# Patient Record
Sex: Female | Born: 1945 | Race: White | Hispanic: No | Smoking: Former smoker
Health system: Southern US, Community
[De-identification: ages and names within clinical notes are randomized; demographics above are authoritative.]

## PROBLEM LIST (undated history)

## (undated) DIAGNOSIS — K5790 Diverticulosis of intestine, part unspecified, without perforation or abscess without bleeding: Secondary | ICD-10-CM

## (undated) DIAGNOSIS — E118 Type 2 diabetes mellitus with unspecified complications: Secondary | ICD-10-CM

## (undated) DIAGNOSIS — G4733 Obstructive sleep apnea (adult) (pediatric): Secondary | ICD-10-CM

## (undated) DIAGNOSIS — E119 Type 2 diabetes mellitus without complications: Secondary | ICD-10-CM

## (undated) DIAGNOSIS — G4730 Sleep apnea, unspecified: Secondary | ICD-10-CM

## (undated) DIAGNOSIS — I639 Cerebral infarction, unspecified: Secondary | ICD-10-CM

## (undated) DIAGNOSIS — I10 Essential (primary) hypertension: Secondary | ICD-10-CM

## (undated) DIAGNOSIS — M4800 Spinal stenosis, site unspecified: Secondary | ICD-10-CM

## (undated) DIAGNOSIS — M1990 Unspecified osteoarthritis, unspecified site: Secondary | ICD-10-CM

## (undated) DIAGNOSIS — E1165 Type 2 diabetes mellitus with hyperglycemia: Secondary | ICD-10-CM

## (undated) DIAGNOSIS — Z8719 Personal history of other diseases of the digestive system: Secondary | ICD-10-CM

## (undated) DIAGNOSIS — K219 Gastro-esophageal reflux disease without esophagitis: Secondary | ICD-10-CM

## (undated) DIAGNOSIS — F329 Major depressive disorder, single episode, unspecified: Secondary | ICD-10-CM

## (undated) DIAGNOSIS — E039 Hypothyroidism, unspecified: Secondary | ICD-10-CM

## (undated) DIAGNOSIS — A419 Sepsis, unspecified organism: Secondary | ICD-10-CM

## (undated) DIAGNOSIS — E785 Hyperlipidemia, unspecified: Secondary | ICD-10-CM

## (undated) HISTORY — DX: Spinal stenosis, site unspecified: M48.00

## (undated) HISTORY — DX: Type 2 diabetes mellitus without complications: E11.9

## (undated) HISTORY — DX: Hyperlipidemia, unspecified: E78.5

## (undated) HISTORY — PX: BACK SURGERY: SHX140

## (undated) HISTORY — PX: NECK SURGERY: SHX720

## (undated) HISTORY — PX: CARPAL TUNNEL RELEASE: SHX101

## (undated) HISTORY — DX: Diverticulosis of intestine, part unspecified, without perforation or abscess without bleeding: K57.90

## (undated) HISTORY — DX: Essential (primary) hypertension: I10

## (undated) HISTORY — PX: TOTAL ABDOMINAL HYSTERECTOMY: SHX209

## (undated) HISTORY — PX: ILIOTIBIAL BAND RELEASE: SHX675

## (undated) HISTORY — DX: Major depressive disorder, single episode, unspecified: F32.9

## (undated) HISTORY — DX: Sepsis, unspecified organism: A41.9

## (undated) HISTORY — DX: Hypothyroidism, unspecified: E03.9

## (undated) HISTORY — DX: Unspecified osteoarthritis, unspecified site: M19.90

## (undated) HISTORY — DX: Gastro-esophageal reflux disease without esophagitis: K21.9

---

## 2003-08-11 ENCOUNTER — Ambulatory Visit (HOSPITAL_COMMUNITY): Admission: RE | Admit: 2003-08-11 | Discharge: 2003-08-11 | Payer: Self-pay | Admitting: Neurology

## 2003-08-11 ENCOUNTER — Encounter: Payer: Self-pay | Admitting: Neurology

## 2003-12-17 ENCOUNTER — Ambulatory Visit (HOSPITAL_COMMUNITY)
Admission: RE | Admit: 2003-12-17 | Discharge: 2003-12-17 | Payer: Self-pay | Admitting: Physical Medicine and Rehabilitation

## 2005-01-10 ENCOUNTER — Encounter: Payer: Self-pay | Admitting: Orthopedic Surgery

## 2005-01-19 ENCOUNTER — Encounter: Payer: Self-pay | Admitting: Orthopedic Surgery

## 2005-02-19 ENCOUNTER — Encounter: Payer: Self-pay | Admitting: Orthopedic Surgery

## 2006-11-16 ENCOUNTER — Ambulatory Visit (HOSPITAL_COMMUNITY): Admission: RE | Admit: 2006-11-16 | Discharge: 2006-11-16 | Payer: Self-pay | Admitting: Family Medicine

## 2009-07-20 ENCOUNTER — Ambulatory Visit (HOSPITAL_COMMUNITY): Admission: RE | Admit: 2009-07-20 | Discharge: 2009-07-20 | Payer: Self-pay | Admitting: Internal Medicine

## 2009-08-05 ENCOUNTER — Ambulatory Visit: Payer: Self-pay | Admitting: Cardiology

## 2009-08-05 DIAGNOSIS — I10 Essential (primary) hypertension: Secondary | ICD-10-CM | POA: Insufficient documentation

## 2009-08-05 DIAGNOSIS — E782 Mixed hyperlipidemia: Secondary | ICD-10-CM

## 2009-08-05 DIAGNOSIS — R072 Precordial pain: Secondary | ICD-10-CM

## 2009-08-07 ENCOUNTER — Ambulatory Visit: Payer: Self-pay | Admitting: Cardiology

## 2009-08-07 ENCOUNTER — Encounter: Payer: Self-pay | Admitting: Cardiology

## 2009-08-07 ENCOUNTER — Encounter (HOSPITAL_COMMUNITY): Admission: RE | Admit: 2009-08-07 | Discharge: 2009-08-20 | Payer: Self-pay | Admitting: Cardiology

## 2009-08-10 ENCOUNTER — Encounter (INDEPENDENT_AMBULATORY_CARE_PROVIDER_SITE_OTHER): Payer: Self-pay | Admitting: *Deleted

## 2009-08-20 ENCOUNTER — Encounter: Payer: Self-pay | Admitting: Cardiology

## 2009-09-01 ENCOUNTER — Ambulatory Visit: Payer: Self-pay | Admitting: Cardiology

## 2009-09-01 DIAGNOSIS — R0602 Shortness of breath: Secondary | ICD-10-CM

## 2010-05-27 ENCOUNTER — Inpatient Hospital Stay (HOSPITAL_COMMUNITY): Admission: EM | Admit: 2010-05-27 | Discharge: 2010-06-04 | Payer: Self-pay | Admitting: Emergency Medicine

## 2010-12-11 ENCOUNTER — Encounter: Payer: Self-pay | Admitting: Family Medicine

## 2010-12-12 ENCOUNTER — Encounter: Payer: Self-pay | Admitting: Family Medicine

## 2010-12-13 ENCOUNTER — Encounter: Payer: Self-pay | Admitting: Internal Medicine

## 2011-02-05 LAB — GLUCOSE, CAPILLARY
Glucose-Capillary: 138 mg/dL — ABNORMAL HIGH (ref 70–99)
Glucose-Capillary: 139 mg/dL — ABNORMAL HIGH (ref 70–99)

## 2011-02-05 LAB — CBC
HCT: 32.7 % — ABNORMAL LOW (ref 36.0–46.0)
Hemoglobin: 11.1 g/dL — ABNORMAL LOW (ref 12.0–15.0)
MCH: 30 pg (ref 26.0–34.0)
MCHC: 33.9 g/dL (ref 30.0–36.0)
MCV: 88.4 fL (ref 78.0–100.0)
Platelets: 186 10*3/uL (ref 150–400)
RBC: 3.7 MIL/uL — ABNORMAL LOW (ref 3.87–5.11)
RDW: 14.6 % (ref 11.5–15.5)
WBC: 13.1 10*3/uL — ABNORMAL HIGH (ref 4.0–10.5)

## 2011-02-05 LAB — BASIC METABOLIC PANEL
BUN: 18 mg/dL (ref 6–23)
CO2: 26 mEq/L (ref 19–32)
Calcium: 8.7 mg/dL (ref 8.4–10.5)
Chloride: 105 mEq/L (ref 96–112)
Creatinine, Ser: 1.06 mg/dL (ref 0.4–1.2)
GFR calc Af Amer: >60 mL/min (ref >60)
GFR calc non Af Amer: 53 mL/min — ABNORMAL LOW (ref >60)
Glucose, Bld: 134 mg/dL — ABNORMAL HIGH (ref 70–99)
Potassium: 3.4 mEq/L — ABNORMAL LOW (ref 3.5–5.1)
Sodium: 139 mEq/L (ref 135–145)

## 2011-02-05 LAB — DIFFERENTIAL
Basophils Absolute: 0 10*3/uL (ref 0.0–0.1)
Basophils Relative: 0 % (ref 0–1)
Eosinophils Absolute: 0.2 10*3/uL (ref 0.0–0.7)
Eosinophils Relative: 1 % (ref 0–5)
Lymphocytes Relative: 18 % (ref 12–46)
Lymphs Abs: 2.3 10*3/uL (ref 0.7–4.0)
Monocytes Absolute: 0.3 10*3/uL (ref 0.1–1.0)
Monocytes Relative: 3 % (ref 3–12)
Neutro Abs: 10.2 10*3/uL — ABNORMAL HIGH (ref 1.7–7.7)
Neutrophils Relative %: 78 % — ABNORMAL HIGH (ref 43–77)

## 2011-02-06 LAB — BASIC METABOLIC PANEL
BUN: 21 mg/dL (ref 6–23)
BUN: 22 mg/dL (ref 6–23)
BUN: 24 mg/dL — ABNORMAL HIGH (ref 6–23)
BUN: 26 mg/dL — ABNORMAL HIGH (ref 6–23)
BUN: 34 mg/dL — ABNORMAL HIGH (ref 6–23)
CO2: 19 mEq/L (ref 19–32)
CO2: 20 mEq/L (ref 19–32)
CO2: 21 mEq/L (ref 19–32)
CO2: 22 mEq/L (ref 19–32)
CO2: 24 mEq/L (ref 19–32)
Calcium: 7.9 mg/dL — ABNORMAL LOW (ref 8.4–10.5)
Calcium: 8.2 mg/dL — ABNORMAL LOW (ref 8.4–10.5)
Calcium: 8.4 mg/dL (ref 8.4–10.5)
Chloride: 103 mEq/L (ref 96–112)
Chloride: 107 mEq/L (ref 96–112)
Chloride: 112 mEq/L (ref 96–112)
Chloride: 92 mEq/L — ABNORMAL LOW (ref 96–112)
Creatinine, Ser: 2.12 mg/dL — ABNORMAL HIGH (ref 0.4–1.2)
Creatinine, Ser: 2.93 mg/dL — ABNORMAL HIGH (ref 0.4–1.2)
GFR calc Af Amer: 20 mL/min — ABNORMAL LOW (ref >60)
GFR calc Af Amer: 29 mL/min — ABNORMAL LOW (ref >60)
GFR calc non Af Amer: 16 mL/min — ABNORMAL LOW (ref >60)
GFR calc non Af Amer: 24 mL/min — ABNORMAL LOW (ref >60)
GFR calc non Af Amer: 30 mL/min — ABNORMAL LOW (ref >60)
GFR calc non Af Amer: 35 mL/min — ABNORMAL LOW (ref >60)
Glucose, Bld: 114 mg/dL — ABNORMAL HIGH (ref 70–99)
Glucose, Bld: 139 mg/dL — ABNORMAL HIGH (ref 70–99)
Glucose, Bld: 143 mg/dL — ABNORMAL HIGH (ref 70–99)
Glucose, Bld: 160 mg/dL — ABNORMAL HIGH (ref 70–99)
Glucose, Bld: 164 mg/dL — ABNORMAL HIGH (ref 70–99)
Potassium: 2.9 mEq/L — ABNORMAL LOW (ref 3.5–5.1)
Potassium: 3.5 mEq/L (ref 3.5–5.1)
Potassium: 3.5 mEq/L (ref 3.5–5.1)
Potassium: 3.7 mEq/L (ref 3.5–5.1)
Potassium: 4 mEq/L (ref 3.5–5.1)
Sodium: 129 mEq/L — ABNORMAL LOW (ref 135–145)
Sodium: 131 mEq/L — ABNORMAL LOW (ref 135–145)
Sodium: 138 mEq/L (ref 135–145)
Sodium: 140 mEq/L (ref 135–145)

## 2011-02-06 LAB — DIFFERENTIAL
Basophils Absolute: 0 10*3/uL (ref 0.0–0.1)
Basophils Absolute: 0 10*3/uL (ref 0.0–0.1)
Basophils Absolute: 0 10*3/uL (ref 0.0–0.1)
Basophils Absolute: 0 10*3/uL (ref 0.0–0.1)
Basophils Absolute: 0 10*3/uL (ref 0.0–0.1)
Basophils Absolute: 0 10*3/uL (ref 0.0–0.1)
Basophils Relative: 0 % (ref 0–1)
Basophils Relative: 0 % (ref 0–1)
Basophils Relative: 0 % (ref 0–1)
Basophils Relative: 0 % (ref 0–1)
Basophils Relative: 0 % (ref 0–1)
Basophils Relative: 0 % (ref 0–1)
Basophils Relative: 0 % (ref 0–1)
Eosinophils Absolute: 0 10*3/uL (ref 0.0–0.7)
Eosinophils Absolute: 0.1 10*3/uL (ref 0.0–0.7)
Eosinophils Absolute: 0.2 10*3/uL (ref 0.0–0.7)
Eosinophils Absolute: 0.2 10*3/uL (ref 0.0–0.7)
Eosinophils Absolute: 0.3 10*3/uL (ref 0.0–0.7)
Eosinophils Absolute: 0.3 10*3/uL (ref 0.0–0.7)
Eosinophils Relative: 0 % (ref 0–5)
Eosinophils Relative: 1 % (ref 0–5)
Eosinophils Relative: 2 % (ref 0–5)
Eosinophils Relative: 2 % (ref 0–5)
Eosinophils Relative: 2 % (ref 0–5)
Eosinophils Relative: 2 % (ref 0–5)
Lymphocytes Relative: 16 % (ref 12–46)
Lymphocytes Relative: 6 % — ABNORMAL LOW (ref 12–46)
Lymphocytes Relative: 6 % — ABNORMAL LOW (ref 12–46)
Lymphocytes Relative: 7 % — ABNORMAL LOW (ref 12–46)
Lymphocytes Relative: 8 % — ABNORMAL LOW (ref 12–46)
Lymphocytes Relative: 8 % — ABNORMAL LOW (ref 12–46)
Lymphs Abs: 0.5 10*3/uL — ABNORMAL LOW (ref 0.7–4.0)
Lymphs Abs: 0.7 10*3/uL (ref 0.7–4.0)
Lymphs Abs: 0.7 10*3/uL (ref 0.7–4.0)
Lymphs Abs: 0.7 10*3/uL (ref 0.7–4.0)
Lymphs Abs: 0.8 10*3/uL (ref 0.7–4.0)
Lymphs Abs: 2.2 10*3/uL (ref 0.7–4.0)
Monocytes Absolute: 0.1 10*3/uL (ref 0.1–1.0)
Monocytes Absolute: 0.3 10*3/uL (ref 0.1–1.0)
Monocytes Absolute: 0.3 10*3/uL (ref 0.1–1.0)
Monocytes Absolute: 0.4 10*3/uL (ref 0.1–1.0)
Monocytes Absolute: 0.4 10*3/uL (ref 0.1–1.0)
Monocytes Absolute: 0.6 10*3/uL (ref 0.1–1.0)
Monocytes Absolute: 0.6 10*3/uL (ref 0.1–1.0)
Monocytes Relative: 3 % (ref 3–12)
Monocytes Relative: 3 % (ref 3–12)
Monocytes Relative: 3 % (ref 3–12)
Monocytes Relative: 4 % (ref 3–12)
Monocytes Relative: 5 % (ref 3–12)
Monocytes Relative: 6 % (ref 3–12)
Neutro Abs: 10 10*3/uL — ABNORMAL HIGH (ref 1.7–7.7)
Neutro Abs: 10 10*3/uL — ABNORMAL HIGH (ref 1.7–7.7)
Neutro Abs: 2.9 10*3/uL (ref 1.7–7.7)
Neutro Abs: 7.3 10*3/uL (ref 1.7–7.7)
Neutro Abs: 8.2 10*3/uL — ABNORMAL HIGH (ref 1.7–7.7)
Neutro Abs: 8.3 10*3/uL — ABNORMAL HIGH (ref 1.7–7.7)
Neutro Abs: 9.8 10*3/uL — ABNORMAL HIGH (ref 1.7–7.7)
Neutrophils Relative %: 77 % (ref 43–77)
Neutrophils Relative %: 85 % — ABNORMAL HIGH (ref 43–77)
Neutrophils Relative %: 86 % — ABNORMAL HIGH (ref 43–77)
Neutrophils Relative %: 90 % — ABNORMAL HIGH (ref 43–77)

## 2011-02-06 LAB — CBC
HCT: 30.3 % — ABNORMAL LOW (ref 36.0–46.0)
HCT: 30.5 % — ABNORMAL LOW (ref 36.0–46.0)
HCT: 30.5 % — ABNORMAL LOW (ref 36.0–46.0)
HCT: 30.7 % — ABNORMAL LOW (ref 36.0–46.0)
HCT: 31.9 % — ABNORMAL LOW (ref 36.0–46.0)
HCT: 33.9 % — ABNORMAL LOW (ref 36.0–46.0)
HCT: 40.5 % (ref 36.0–46.0)
Hemoglobin: 10.4 g/dL — ABNORMAL LOW (ref 12.0–15.0)
Hemoglobin: 10.5 g/dL — ABNORMAL LOW (ref 12.0–15.0)
Hemoglobin: 10.6 g/dL — ABNORMAL LOW (ref 12.0–15.0)
Hemoglobin: 11.1 g/dL — ABNORMAL LOW (ref 12.0–15.0)
Hemoglobin: 11.4 g/dL — ABNORMAL LOW (ref 12.0–15.0)
Hemoglobin: 11.6 g/dL — ABNORMAL LOW (ref 12.0–15.0)
Hemoglobin: 13.8 g/dL (ref 12.0–15.0)
MCH: 30 pg (ref 26.0–34.0)
MCH: 30 pg (ref 26.0–34.0)
MCH: 30.2 pg (ref 26.0–34.0)
MCH: 30.2 pg (ref 26.0–34.0)
MCH: 30.3 pg (ref 26.0–34.0)
MCH: 30.3 pg (ref 26.0–34.0)
MCH: 30.5 pg (ref 26.0–34.0)
MCH: 30.5 pg (ref 26.0–34.0)
MCHC: 34.1 g/dL (ref 30.0–36.0)
MCHC: 34.1 g/dL (ref 30.0–36.0)
MCHC: 34.1 g/dL (ref 30.0–36.0)
MCHC: 34.3 g/dL (ref 30.0–36.0)
MCHC: 34.3 g/dL (ref 30.0–36.0)
MCHC: 34.6 g/dL (ref 30.0–36.0)
MCHC: 34.6 g/dL (ref 30.0–36.0)
MCHC: 34.8 g/dL (ref 30.0–36.0)
MCV: 87.6 fL (ref 78.0–100.0)
MCV: 87.7 fL (ref 78.0–100.0)
MCV: 88.1 fL (ref 78.0–100.0)
MCV: 88.1 fL (ref 78.0–100.0)
MCV: 88.6 fL (ref 78.0–100.0)
MCV: 88.8 fL (ref 78.0–100.0)
Platelets: 104 10*3/uL — ABNORMAL LOW (ref 150–400)
Platelets: 137 10*3/uL — ABNORMAL LOW (ref 150–400)
Platelets: 70 10*3/uL — ABNORMAL LOW (ref 150–400)
Platelets: 85 10*3/uL — ABNORMAL LOW (ref 150–400)
Platelets: 95 10*3/uL — ABNORMAL LOW (ref 150–400)
RBC: 3.46 MIL/uL — ABNORMAL LOW (ref 3.87–5.11)
RBC: 3.85 MIL/uL — ABNORMAL LOW (ref 3.87–5.11)
RBC: 4.56 MIL/uL (ref 3.87–5.11)
RBC: 4.63 MIL/uL (ref 3.87–5.11)
RDW: 13.9 % (ref 11.5–15.5)
RDW: 15 % (ref 11.5–15.5)
RDW: 15.5 % (ref 11.5–15.5)
RDW: 15.6 % — ABNORMAL HIGH (ref 11.5–15.5)
RDW: 15.7 % — ABNORMAL HIGH (ref 11.5–15.5)
RDW: 15.8 % — ABNORMAL HIGH (ref 11.5–15.5)
WBC: 11.1 10*3/uL — ABNORMAL HIGH (ref 4.0–10.5)
WBC: 8.5 10*3/uL (ref 4.0–10.5)
WBC: 9.7 10*3/uL (ref 4.0–10.5)

## 2011-02-06 LAB — GLUCOSE, CAPILLARY
Glucose-Capillary: 107 mg/dL — ABNORMAL HIGH (ref 70–99)
Glucose-Capillary: 114 mg/dL — ABNORMAL HIGH (ref 70–99)
Glucose-Capillary: 117 mg/dL — ABNORMAL HIGH (ref 70–99)
Glucose-Capillary: 120 mg/dL — ABNORMAL HIGH (ref 70–99)
Glucose-Capillary: 121 mg/dL — ABNORMAL HIGH (ref 70–99)
Glucose-Capillary: 123 mg/dL — ABNORMAL HIGH (ref 70–99)
Glucose-Capillary: 125 mg/dL — ABNORMAL HIGH (ref 70–99)
Glucose-Capillary: 128 mg/dL — ABNORMAL HIGH (ref 70–99)
Glucose-Capillary: 134 mg/dL — ABNORMAL HIGH (ref 70–99)
Glucose-Capillary: 136 mg/dL — ABNORMAL HIGH (ref 70–99)
Glucose-Capillary: 138 mg/dL — ABNORMAL HIGH (ref 70–99)
Glucose-Capillary: 142 mg/dL — ABNORMAL HIGH (ref 70–99)
Glucose-Capillary: 148 mg/dL — ABNORMAL HIGH (ref 70–99)
Glucose-Capillary: 150 mg/dL — ABNORMAL HIGH (ref 70–99)
Glucose-Capillary: 164 mg/dL — ABNORMAL HIGH (ref 70–99)
Glucose-Capillary: 168 mg/dL — ABNORMAL HIGH (ref 70–99)
Glucose-Capillary: 198 mg/dL — ABNORMAL HIGH (ref 70–99)

## 2011-02-06 LAB — COMPREHENSIVE METABOLIC PANEL
ALT: 18 U/L (ref 0–35)
ALT: 27 U/L (ref 0–35)
AST: 27 U/L (ref 0–37)
AST: 30 U/L (ref 0–37)
AST: 42 U/L — ABNORMAL HIGH (ref 0–37)
Albumin: 1.9 g/dL — ABNORMAL LOW (ref 3.5–5.2)
Albumin: 2.2 g/dL — ABNORMAL LOW (ref 3.5–5.2)
Alkaline Phosphatase: 90 U/L (ref 39–117)
Alkaline Phosphatase: 96 U/L (ref 39–117)
BUN: 29 mg/dL — ABNORMAL HIGH (ref 6–23)
BUN: 31 mg/dL — ABNORMAL HIGH (ref 6–23)
CO2: 17 mEq/L — ABNORMAL LOW (ref 19–32)
CO2: 20 mEq/L (ref 19–32)
CO2: 20 mEq/L (ref 19–32)
Calcium: 7 mg/dL — ABNORMAL LOW (ref 8.4–10.5)
Calcium: 7.5 mg/dL — ABNORMAL LOW (ref 8.4–10.5)
Calcium: 7.6 mg/dL — ABNORMAL LOW (ref 8.4–10.5)
Chloride: 107 mEq/L (ref 96–112)
Chloride: 97 mEq/L (ref 96–112)
Creatinine, Ser: 2.29 mg/dL — ABNORMAL HIGH (ref 0.4–1.2)
Creatinine, Ser: 2.86 mg/dL — ABNORMAL HIGH (ref 0.4–1.2)
Creatinine, Ser: 2.94 mg/dL — ABNORMAL HIGH (ref 0.4–1.2)
GFR calc Af Amer: 20 mL/min — ABNORMAL LOW (ref >60)
GFR calc Af Amer: 26 mL/min — ABNORMAL LOW (ref >60)
GFR calc Af Amer: 51 mL/min — ABNORMAL LOW (ref >60)
GFR calc non Af Amer: 16 mL/min — ABNORMAL LOW (ref >60)
GFR calc non Af Amer: 17 mL/min — ABNORMAL LOW (ref >60)
GFR calc non Af Amer: 22 mL/min — ABNORMAL LOW (ref >60)
Glucose, Bld: 117 mg/dL — ABNORMAL HIGH (ref 70–99)
Glucose, Bld: 144 mg/dL — ABNORMAL HIGH (ref 70–99)
Potassium: 3.2 mEq/L — ABNORMAL LOW (ref 3.5–5.1)
Potassium: 4 mEq/L (ref 3.5–5.1)
Sodium: 135 mEq/L (ref 135–145)
Sodium: 141 mEq/L (ref 135–145)
Total Bilirubin: 0.8 mg/dL (ref 0.3–1.2)
Total Bilirubin: 1.4 mg/dL — ABNORMAL HIGH (ref 0.3–1.2)
Total Protein: 5.3 g/dL — ABNORMAL LOW (ref 6.0–8.3)
Total Protein: 5.5 g/dL — ABNORMAL LOW (ref 6.0–8.3)

## 2011-02-06 LAB — CLOSTRIDIUM DIFFICILE EIA: C difficile Toxins A+B, EIA: NEGATIVE

## 2011-02-06 LAB — HEPATIC FUNCTION PANEL
ALT: 23 U/L (ref 0–35)
AST: 38 U/L — ABNORMAL HIGH (ref 0–37)
Albumin: 2.8 g/dL — ABNORMAL LOW (ref 3.5–5.2)
Alkaline Phosphatase: 55 U/L (ref 39–117)
Bilirubin, Direct: 0.4 mg/dL — ABNORMAL HIGH (ref 0.0–0.3)
Indirect Bilirubin: 0.6 mg/dL (ref 0.3–0.9)
Total Bilirubin: 1 mg/dL (ref 0.3–1.2)
Total Protein: 5.9 g/dL — ABNORMAL LOW (ref 6.0–8.3)

## 2011-02-06 LAB — BLOOD GAS, ARTERIAL
Acid-base deficit: 5.4 mmol/L — ABNORMAL HIGH (ref 0.0–2.0)
Acid-base deficit: 7 mmol/L — ABNORMAL HIGH (ref 0.0–2.0)
Acid-base deficit: 7.3 mmol/L — ABNORMAL HIGH (ref 0.0–2.0)
Bicarbonate: 14.3 mEq/L — ABNORMAL LOW (ref 20.0–24.0)
Bicarbonate: 17.1 mEq/L — ABNORMAL LOW (ref 20.0–24.0)
Bicarbonate: 17.6 mEq/L — ABNORMAL LOW (ref 20.0–24.0)
Bicarbonate: 19.8 mEq/L — ABNORMAL LOW (ref 20.0–24.0)
O2 Content: 3 L/min
O2 Content: 3 L/min
O2 Content: 3 L/min
O2 Content: 5 L/min
O2 Saturation: 94.6 %
O2 Saturation: 96.1 %
O2 Saturation: 97 %
O2 Saturation: 97.9 %
Patient temperature: 37
Patient temperature: 37
Patient temperature: 37
Patient temperature: 98.6
TCO2: 18.1 mmol/L (ref 0–100)
pCO2 arterial: 29.7 mmHg — ABNORMAL LOW (ref 35.0–45.0)
pCO2 arterial: 35.1 mmHg (ref 35.0–45.0)
pCO2 arterial: 40.6 mmHg (ref 35.0–45.0)
pH, Arterial: 7.308 — ABNORMAL LOW (ref 7.350–7.400)
pH, Arterial: 7.321 — ABNORMAL LOW (ref 7.350–7.400)
pH, Arterial: 7.379 (ref 7.350–7.400)
pO2, Arterial: 106 mmHg — ABNORMAL HIGH (ref 80.0–100.0)
pO2, Arterial: 81.5 mmHg (ref 80.0–100.0)
pO2, Arterial: 85.2 mmHg (ref 80.0–100.0)
pO2, Arterial: 89.4 mmHg (ref 80.0–100.0)

## 2011-02-06 LAB — DIC (DISSEMINATED INTRAVASCULAR COAGULATION)PANEL
D-Dimer, Quant: 3 ug/mL-FEU — ABNORMAL HIGH (ref 0.00–0.48)
Fibrinogen: >800 mg/dL — ABNORMAL HIGH (ref 204–475)
Platelets: 84 10*3/uL — ABNORMAL LOW (ref 150–400)

## 2011-02-06 LAB — CULTURE, BLOOD (ROUTINE X 2)
Culture: NO GROWTH
Report Status: 7122011

## 2011-02-06 LAB — URINALYSIS, ROUTINE W REFLEX MICROSCOPIC
Nitrite: NEGATIVE
Protein, ur: 100 mg/dL — AB
Specific Gravity, Urine: 1.025 (ref 1.005–1.030)
Urobilinogen, UA: 0.2 mg/dL (ref 0.0–1.0)

## 2011-02-06 LAB — MRSA PCR SCREENING: MRSA by PCR: NEGATIVE

## 2011-02-06 LAB — ACTH STIMULATION, 3 TIME POINTS: Cortisol, Base: 19.3 ug/dL

## 2011-02-06 LAB — APTT: aPTT: 32 seconds (ref 24–37)

## 2011-02-06 LAB — URINE CULTURE

## 2011-02-06 LAB — T4, FREE: Free T4: 1.45 ng/dL (ref 0.80–1.80)

## 2011-02-06 LAB — BRAIN NATRIURETIC PEPTIDE: Pro B Natriuretic peptide (BNP): 340 pg/mL — ABNORMAL HIGH (ref 0.0–100.0)

## 2011-02-06 LAB — LACTIC ACID, PLASMA
Lactic Acid, Venous: 4.2 mmol/L — ABNORMAL HIGH (ref 0.5–2.2)
Lactic Acid, Venous: 4.7 mmol/L — ABNORMAL HIGH (ref 0.5–2.2)

## 2011-02-06 LAB — PROCALCITONIN: Procalcitonin: 113.34 ng/mL

## 2011-02-06 LAB — LIPASE, BLOOD: Lipase: 18 U/L (ref 11–59)

## 2011-02-06 LAB — URINE MICROSCOPIC-ADD ON

## 2011-02-06 LAB — MAGNESIUM: Magnesium: 1.6 mg/dL (ref 1.5–2.5)

## 2011-07-26 ENCOUNTER — Other Ambulatory Visit (HOSPITAL_COMMUNITY): Payer: Self-pay | Admitting: Family Medicine

## 2011-07-26 DIAGNOSIS — Z139 Encounter for screening, unspecified: Secondary | ICD-10-CM

## 2011-07-28 ENCOUNTER — Ambulatory Visit (HOSPITAL_COMMUNITY): Payer: Medicaid Other

## 2011-08-05 ENCOUNTER — Ambulatory Visit (HOSPITAL_COMMUNITY): Payer: Medicaid Other

## 2011-08-12 ENCOUNTER — Ambulatory Visit (HOSPITAL_COMMUNITY)
Admission: RE | Admit: 2011-08-12 | Discharge: 2011-08-12 | Disposition: A | Discharge status: Home / Self Care | Payer: Medicaid Other | Source: Ambulatory Visit | Attending: Family Medicine | Admitting: Family Medicine

## 2011-08-12 DIAGNOSIS — Z139 Encounter for screening, unspecified: Secondary | ICD-10-CM

## 2011-08-12 DIAGNOSIS — Z1231 Encounter for screening mammogram for malignant neoplasm of breast: Secondary | ICD-10-CM | POA: Insufficient documentation

## 2011-08-17 ENCOUNTER — Other Ambulatory Visit: Payer: Self-pay | Admitting: Family Medicine

## 2011-08-17 DIAGNOSIS — R928 Other abnormal and inconclusive findings on diagnostic imaging of breast: Secondary | ICD-10-CM

## 2011-09-07 ENCOUNTER — Ambulatory Visit (HOSPITAL_COMMUNITY)
Admission: RE | Admit: 2011-09-07 | Discharge: 2011-09-07 | Disposition: A | Discharge status: Home / Self Care | Payer: Medicaid Other | Source: Ambulatory Visit | Attending: Family Medicine | Admitting: Family Medicine

## 2011-09-07 ENCOUNTER — Other Ambulatory Visit: Payer: Self-pay | Admitting: Family Medicine

## 2011-09-07 DIAGNOSIS — R928 Other abnormal and inconclusive findings on diagnostic imaging of breast: Secondary | ICD-10-CM

## 2011-11-16 ENCOUNTER — Encounter: Payer: Self-pay | Admitting: Cardiology

## 2012-02-24 ENCOUNTER — Other Ambulatory Visit (HOSPITAL_COMMUNITY): Payer: Self-pay | Admitting: Physician Assistant

## 2012-02-24 DIAGNOSIS — Z09 Encounter for follow-up examination after completed treatment for conditions other than malignant neoplasm: Secondary | ICD-10-CM

## 2012-03-14 ENCOUNTER — Ambulatory Visit (HOSPITAL_COMMUNITY)
Admission: RE | Admit: 2012-03-14 | Discharge: 2012-03-14 | Disposition: A | Discharge status: Home / Self Care | Payer: Medicaid Other | Source: Ambulatory Visit | Attending: Physician Assistant | Admitting: Physician Assistant

## 2012-03-14 ENCOUNTER — Other Ambulatory Visit (HOSPITAL_COMMUNITY): Payer: Self-pay | Admitting: Physician Assistant

## 2012-03-14 DIAGNOSIS — Z09 Encounter for follow-up examination after completed treatment for conditions other than malignant neoplasm: Secondary | ICD-10-CM

## 2012-03-14 DIAGNOSIS — N6489 Other specified disorders of breast: Secondary | ICD-10-CM | POA: Insufficient documentation

## 2012-09-04 ENCOUNTER — Other Ambulatory Visit (HOSPITAL_COMMUNITY): Payer: Self-pay | Admitting: Physician Assistant

## 2012-09-04 DIAGNOSIS — Z09 Encounter for follow-up examination after completed treatment for conditions other than malignant neoplasm: Secondary | ICD-10-CM

## 2012-09-12 ENCOUNTER — Ambulatory Visit (HOSPITAL_COMMUNITY)
Admission: RE | Admit: 2012-09-12 | Discharge: 2012-09-12 | Disposition: A | Discharge status: Home / Self Care | Payer: Medicaid Other | Source: Ambulatory Visit | Attending: Physician Assistant | Admitting: Physician Assistant

## 2012-09-12 DIAGNOSIS — R928 Other abnormal and inconclusive findings on diagnostic imaging of breast: Secondary | ICD-10-CM | POA: Insufficient documentation

## 2012-09-12 DIAGNOSIS — Z09 Encounter for follow-up examination after completed treatment for conditions other than malignant neoplasm: Secondary | ICD-10-CM | POA: Insufficient documentation

## 2013-04-18 DIAGNOSIS — Z6832 Body mass index (BMI) 32.0-32.9, adult: Secondary | ICD-10-CM | POA: Diagnosis not present

## 2013-04-18 DIAGNOSIS — IMO0001 Reserved for inherently not codable concepts without codable children: Secondary | ICD-10-CM | POA: Diagnosis not present

## 2013-04-18 DIAGNOSIS — T148 Other injury of unspecified body region: Secondary | ICD-10-CM | POA: Diagnosis not present

## 2013-04-18 DIAGNOSIS — W57XXXA Bitten or stung by nonvenomous insect and other nonvenomous arthropods, initial encounter: Secondary | ICD-10-CM | POA: Diagnosis not present

## 2013-04-26 DIAGNOSIS — M479 Spondylosis, unspecified: Secondary | ICD-10-CM | POA: Diagnosis not present

## 2013-04-26 DIAGNOSIS — M5137 Other intervertebral disc degeneration, lumbosacral region: Secondary | ICD-10-CM | POA: Diagnosis not present

## 2013-04-26 DIAGNOSIS — M4120 Other idiopathic scoliosis, site unspecified: Secondary | ICD-10-CM | POA: Diagnosis not present

## 2013-04-26 DIAGNOSIS — M545 Low back pain: Secondary | ICD-10-CM | POA: Diagnosis not present

## 2013-05-02 DIAGNOSIS — E119 Type 2 diabetes mellitus without complications: Secondary | ICD-10-CM | POA: Diagnosis not present

## 2013-05-02 DIAGNOSIS — R109 Unspecified abdominal pain: Secondary | ICD-10-CM | POA: Diagnosis not present

## 2013-05-02 DIAGNOSIS — Z6832 Body mass index (BMI) 32.0-32.9, adult: Secondary | ICD-10-CM | POA: Diagnosis not present

## 2013-05-02 DIAGNOSIS — Z23 Encounter for immunization: Secondary | ICD-10-CM | POA: Diagnosis not present

## 2013-05-06 ENCOUNTER — Ambulatory Visit (INDEPENDENT_AMBULATORY_CARE_PROVIDER_SITE_OTHER): Payer: Medicare Other | Admitting: Gastroenterology

## 2013-05-06 ENCOUNTER — Encounter: Payer: Self-pay | Admitting: Gastroenterology

## 2013-05-06 VITALS — BP 144/86 | HR 70 | Temp 98.1°F | Ht 65.0 in | Wt 189.6 lb

## 2013-05-06 DIAGNOSIS — R1310 Dysphagia, unspecified: Secondary | ICD-10-CM

## 2013-05-06 DIAGNOSIS — R1013 Epigastric pain: Secondary | ICD-10-CM | POA: Insufficient documentation

## 2013-05-06 DIAGNOSIS — R1011 Right upper quadrant pain: Secondary | ICD-10-CM | POA: Diagnosis not present

## 2013-05-06 DIAGNOSIS — K219 Gastro-esophageal reflux disease without esophagitis: Secondary | ICD-10-CM

## 2013-05-06 DIAGNOSIS — R1314 Dysphagia, pharyngoesophageal phase: Secondary | ICD-10-CM | POA: Diagnosis not present

## 2013-05-06 DIAGNOSIS — Z8371 Family history of colonic polyps: Secondary | ICD-10-CM

## 2013-05-06 DIAGNOSIS — Z83719 Family history of colon polyps, unspecified: Secondary | ICD-10-CM | POA: Insufficient documentation

## 2013-05-06 MED ORDER — PEG 3350-KCL-NA BICARB-NACL 420 G PO SOLR: 4000.0000 mL | ORAL | Status: DC

## 2013-05-06 NOTE — Assessment & Plan Note (Signed)
67 year old lady with chronic GERD, epigastric pain, chronic back pain he takes occasional Excedrin Migraine. She has developed frequent epigastric/right upper quadrant pain. Seems to be unrelated to meals or activities. She does have some solid food esophageal dysphagia. Recommend EGD with dilation in the near future.  I have discussed the risks, alternatives, benefits with regards to but not limited to the risk of reaction to medication, bleeding, infection, perforation and the patient is agreeable to proceed. Written consent to be obtained. Continue omeprazole as before. Further recommendations to follow.

## 2013-05-06 NOTE — Assessment & Plan Note (Signed)
No prior colonoscopy. Her brother has had colon polyps. She recently developed some increased loose stools, bloody mucus on Augmentin which is now resolved. Recommend colonoscopy in the near future.  I have discussed the risks, alternatives, benefits with regards to but not limited to the risk of reaction to medication, bleeding, infection, perforation and the patient is agreeable to proceed. Written consent to be obtained.   Patient complains of mass in the right lower back region. On exam it is quite dense, questionable bony. Await EGD and colonoscopy findings. She may need to have some sort of imaging but I am not sure that CT of the abdomen would be beneficial. The last Dr. Gala Romney for further recommendations.

## 2013-05-06 NOTE — Progress Notes (Signed)
Primary Care Physician:  Collene Mares, PA-C  Primary Gastroenterologist:  Garfield Cornea, MD   Chief Complaint  Patient presents with  . Abdominal Pain    knot on right side of back and pain in center of chest  . Colonoscopy    HPI:  Brittany Goodman is a 67 y.o. female here for further evaluation of abdominal pain and to schedule colonoscopy. She has never had a colonoscopy. Currently taking Doxycyline for RMSF, before that took ten days of Augmentin. Fullness in right back/flank. Started about one year ago. Initially told that she had a muscle spasm. She's been on Flexeril chronically. She has a history of scoliosis. RUQ pain for couple of months. Nothing seems to aggravate it. Some days worse than others. Not real active. Excedrine migraine if real bad pain, seems to help. Does not take it every day. Takes Tylenol for pain as well. Appetite ok. Few waves of nausea without vomiting. Prilosec controls heartburn. >5 years of GERD. No prior EGD. Some esophageal dysphagia, dry foods, breads. BM generally more constipated before the Augmentin. Then some blood and mucous. Doing ok with Doxycyline. No diarrhea, no further bleeding. Eating yogurt.   Goes to [facility] spinal stenosis. Just went 04/26/13.   Current Outpatient Prescriptions  Medication Sig Dispense Refill  . acetaminophen (TYLENOL) 325 MG tablet Take 650 mg by mouth every 6 (six) hours as needed for pain.      Marland Kitchen aspirin-acetaminophen-caffeine (EXCEDRIN MIGRAINE) 250-250-65 MG per tablet Take 1 tablet by mouth every 6 (six) hours as needed for pain.      Marland Kitchen atenolol (TENORMIN) 100 MG tablet Take 100 mg by mouth daily.        . citalopram (CELEXA) 20 MG tablet Take 20 mg by mouth daily.        . cyclobenzaprine (FLEXERIL) 10 MG tablet Take 10 mg by mouth 3 (three) times daily as needed.        . doxycycline (VIBRA-TABS) 100 MG tablet       . glimepiride (AMARYL) 1 MG tablet Take 1 mg by mouth daily before breakfast.       .  levothyroxine (SYNTHROID, LEVOTHROID) 100 MCG tablet Take 100 mcg by mouth daily.        Marland Kitchen lisinopril (PRINIVIL,ZESTRIL) 5 MG tablet Take 5 mg by mouth daily.       Marland Kitchen omeprazole (PRILOSEC) 20 MG capsule Take 20 mg by mouth daily.       . vitamin B-12 (CYANOCOBALAMIN) 100 MCG tablet Take 50 mcg by mouth daily.        . polyethylene glycol-electrolytes (TRILYTE) 420 G solution Take 4,000 mLs by mouth as directed.  4000 mL  0   No current facility-administered medications for this visit.    Allergies as of 05/06/2013 - Review Complete 05/06/2013  Allergen Reaction Noted  . Codeine  08/04/2009  . Dilaudid (hydromorphone hcl)  05/06/2013  . Morphine  08/04/2009  . Tetracycline  08/04/2009    Past Medical History  Diagnosis Date  . Arthritis   . Diabetes mellitus type II   . Hyperlipidemia   . Hypertension   . Spinal stenosis   . Hypothyroidism   . GERD (gastroesophageal reflux disease)   . Depression   . Sepsis     2011, Escherichia coli pyelonephritis  . Diverticulosis     Past Surgical History  Procedure Laterality Date  . Total abdominal hysterectomy    . Back surgery    .  Carpal tunnel release    . Iliotibial band release      Family History  Problem Relation Age of Onset  . Heart disease Father   . Heart attack Father 58  . Heart disease Brother   . Colon cancer Neg Hx   . Colon polyps Brother     History   Social History  . Marital Status: Widowed    Spouse Name: N/A    Number of Children: N/A  . Years of Education: N/A   Occupational History  . Not on file.   Social History Main Topics  . Smoking status: Never Smoker   . Smokeless tobacco: Not on file  . Alcohol Use: No  . Drug Use: No  . Sexually Active: Not on file   Other Topics Concern  . Not on file   Social History Narrative   No regular exercise      ROS:  General: Negative for anorexia, weight loss, fever, chills, fatigue, weakness. Eyes: Negative for vision changes.  ENT:  Negative for hoarseness, difficulty swallowing , nasal congestion. CV: Negative for chest pain, angina, palpitations, dyspnea on exertion, peripheral edema.  Respiratory: Negative for dyspnea at rest, dyspnea on exertion, cough, sputum, wheezing.  GI: See history of present illness. GU:  Negative for dysuria, hematuria, urinary incontinence, urinary frequency, nocturnal urination.  MS: Positive for joint pain, low back pain.  Derm: Negative for rash or itching.  Neuro: Negative for weakness, abnormal sensation, seizure, frequent headaches, memory loss, confusion.  Psych: Negative for anxiety, depression, suicidal ideation, hallucinations.  Endo: Negative for unusual weight change.  Heme: Negative for bruising or bleeding. Allergy: Negative for rash or hives.    Physical Examination:  BP 144/86  Pulse 70  Temp(Src) 98.1 F (36.7 C) (Oral)  Ht 5' 5"  (1.651 m)  Wt 189 lb 9.6 oz (86.002 kg)  BMI 31.55 kg/m2   General: Well-nourished, well-developed in no acute distress.  Head: Normocephalic, atraumatic.   Eyes: Conjunctiva pink, no icterus. Mouth: Oropharyngeal mucosa moist and pink , no lesions erythema or exudate. Neck: Supple without thyromegaly, masses, or lymphadenopathy.  Lungs: Clear to auscultation bilaterally.  Heart: Regular rate and rhythm, no murmurs rubs or gallops.  Abdomen: Bowel sounds are normal, mild to moderate epigastric tenderness, nondistended, no hepatosplenomegaly or masses, no abdominal bruits or hernia , no rebound or guarding.  Right lower back, baseball-sized hard area, questionable bony. Rectal: Deferred Extremities: No lower extremity edema. No clubbing or deformities.  Neuro: Alert and oriented x 4 , grossly normal neurologically.  Skin: Warm and dry, no rash or jaundice.   Psych: Alert and cooperative, normal mood and affect.  Labs: Labs from 04/18/2013. Sodium 136, potassium 4.3, glucose 84, BUN 14, creatinine 0.85, total bilirubin 0.5, alkaline  phosphatase 57, AST 24, ALT 21, albumin 3.8, white blood cell count 5700, hemoglobin 13.1, hematocrit 38.9, MCV 86.3, platelets 225,000, Rocky Mountain spotted fever IgM 1.08 equivocal, IgG 0.86 equivocal  Imaging Studies: No results found.

## 2013-05-06 NOTE — Patient Instructions (Addendum)
We have scheduled you for an upper endoscopy and colonoscopy with Dr. Gala Romney. Please see separate instructions.

## 2013-05-06 NOTE — Progress Notes (Signed)
Cc PCP 

## 2013-05-07 LAB — CBC
HCT: 39 %
HGB: 13.1 g/dL
MCV: 86.3 fL
WBC: 5.7

## 2013-05-07 LAB — COMPREHENSIVE METABOLIC PANEL
ALT: 21 U/L (ref 7–35)
Albumin: 3.8
Glucose: 84
Potassium: 4.3 mmol/L
Sodium: 136 mmol/L — AB (ref 137–147)

## 2013-05-09 DIAGNOSIS — E119 Type 2 diabetes mellitus without complications: Secondary | ICD-10-CM | POA: Diagnosis not present

## 2013-05-10 ENCOUNTER — Encounter (HOSPITAL_COMMUNITY): Payer: Self-pay | Admitting: Pharmacy Technician

## 2013-05-29 ENCOUNTER — Encounter (HOSPITAL_COMMUNITY): Payer: Self-pay | Admitting: *Deleted

## 2013-05-29 ENCOUNTER — Ambulatory Visit (HOSPITAL_COMMUNITY)
Admission: RE | Admit: 2013-05-29 | Discharge: 2013-05-29 | Disposition: A | Discharge status: Home / Self Care | Payer: Medicare Other | Source: Ambulatory Visit | Attending: Internal Medicine | Admitting: Internal Medicine

## 2013-05-29 ENCOUNTER — Encounter (HOSPITAL_COMMUNITY)
Admission: RE | Disposition: A | Discharge status: Home / Self Care | Payer: Self-pay | Source: Ambulatory Visit | Attending: Internal Medicine

## 2013-05-29 DIAGNOSIS — R1013 Epigastric pain: Secondary | ICD-10-CM

## 2013-05-29 DIAGNOSIS — K319 Disease of stomach and duodenum, unspecified: Secondary | ICD-10-CM | POA: Insufficient documentation

## 2013-05-29 DIAGNOSIS — Z01812 Encounter for preprocedural laboratory examination: Secondary | ICD-10-CM | POA: Insufficient documentation

## 2013-05-29 DIAGNOSIS — R1310 Dysphagia, unspecified: Secondary | ICD-10-CM | POA: Insufficient documentation

## 2013-05-29 DIAGNOSIS — K2960 Other gastritis without bleeding: Secondary | ICD-10-CM | POA: Diagnosis not present

## 2013-05-29 DIAGNOSIS — Z8371 Family history of colonic polyps: Secondary | ICD-10-CM | POA: Insufficient documentation

## 2013-05-29 DIAGNOSIS — K449 Diaphragmatic hernia without obstruction or gangrene: Secondary | ICD-10-CM | POA: Insufficient documentation

## 2013-05-29 DIAGNOSIS — R1011 Right upper quadrant pain: Secondary | ICD-10-CM

## 2013-05-29 DIAGNOSIS — K5730 Diverticulosis of large intestine without perforation or abscess without bleeding: Secondary | ICD-10-CM | POA: Insufficient documentation

## 2013-05-29 DIAGNOSIS — R933 Abnormal findings on diagnostic imaging of other parts of digestive tract: Secondary | ICD-10-CM | POA: Diagnosis not present

## 2013-05-29 DIAGNOSIS — E119 Type 2 diabetes mellitus without complications: Secondary | ICD-10-CM | POA: Diagnosis not present

## 2013-05-29 DIAGNOSIS — Z1211 Encounter for screening for malignant neoplasm of colon: Secondary | ICD-10-CM | POA: Diagnosis not present

## 2013-05-29 DIAGNOSIS — I10 Essential (primary) hypertension: Secondary | ICD-10-CM | POA: Diagnosis not present

## 2013-05-29 DIAGNOSIS — R1319 Other dysphagia: Secondary | ICD-10-CM

## 2013-05-29 DIAGNOSIS — Z83719 Family history of colon polyps, unspecified: Secondary | ICD-10-CM | POA: Insufficient documentation

## 2013-05-29 DIAGNOSIS — K219 Gastro-esophageal reflux disease without esophagitis: Secondary | ICD-10-CM

## 2013-05-29 DIAGNOSIS — K559 Vascular disorder of intestine, unspecified: Secondary | ICD-10-CM | POA: Diagnosis not present

## 2013-05-29 HISTORY — PX: ESOPHAGOGASTRODUODENOSCOPY (EGD) WITH ESOPHAGEAL DILATION: SHX5812

## 2013-05-29 HISTORY — PX: COLONOSCOPY: SHX5424

## 2013-05-29 SURGERY — COLONOSCOPY
Anesthesia: Moderate Sedation

## 2013-05-29 MED ORDER — MEPERIDINE HCL 100 MG/ML IJ SOLN
INTRAMUSCULAR | Status: AC
  Filled 2013-05-29: qty 2

## 2013-05-29 MED ORDER — ONDANSETRON HCL 4 MG/2ML IJ SOLN
INTRAMUSCULAR | Status: AC
  Filled 2013-05-29: qty 2

## 2013-05-29 MED ORDER — SODIUM CHLORIDE 0.9 % IV SOLN
INTRAVENOUS | Status: DC
  Administered 2013-05-29: 10:00:00 via INTRAVENOUS

## 2013-05-29 MED ORDER — STERILE WATER FOR IRRIGATION IR SOLN
Status: DC | PRN
  Administered 2013-05-29: 11:00:00

## 2013-05-29 MED ORDER — ONDANSETRON HCL 4 MG/2ML IJ SOLN
INTRAMUSCULAR | Status: DC | PRN
  Administered 2013-05-29: 4 mg via INTRAVENOUS

## 2013-05-29 MED ORDER — MIDAZOLAM HCL 5 MG/5ML IJ SOLN
INTRAMUSCULAR | Status: DC | PRN
  Administered 2013-05-29: 2 mg via INTRAVENOUS
  Administered 2013-05-29: 1 mg via INTRAVENOUS
  Administered 2013-05-29: 2 mg via INTRAVENOUS
  Administered 2013-05-29 (×3): 1 mg via INTRAVENOUS

## 2013-05-29 MED ORDER — MEPERIDINE HCL 100 MG/ML IJ SOLN
INTRAMUSCULAR | Status: DC | PRN
  Administered 2013-05-29 (×2): 50 mg via INTRAVENOUS

## 2013-05-29 MED ORDER — BUTAMBEN-TETRACAINE-BENZOCAINE 2-2-14 % EX AERO
INHALATION_SPRAY | CUTANEOUS | Status: DC | PRN
  Administered 2013-05-29: 2 via TOPICAL

## 2013-05-29 MED ORDER — MIDAZOLAM HCL 5 MG/5ML IJ SOLN
INTRAMUSCULAR | Status: AC
  Filled 2013-05-29: qty 10

## 2013-05-29 NOTE — Op Note (Signed)
[place], [address]   COLONOSCOPY PROCEDURE REPORT  PATIENT: Brittany Goodman, Brittany Goodman.  MR#:         [id number] BIRTHDATE: 1946-04-04 , 66  yrs. old GENDER: Female ENDOSCOPIST: R.  Garfield Cornea, MD FACP [facility] REFERRED BY:  Collene Mares, PA-C PROCEDURE DATE:  05/29/2013 PROCEDURE:     Ileocolonoscopy with biopsy  INDICATIONS: First ever colorectal cancer screening examination.  INFORMED CONSENT:  The risks, benefits, alternatives and imponderables including but not limited to bleeding, perforation as well as the possibility of a missed lesion have been reviewed.  The potential for biopsy, lesion removal, etc. have also been discussed.  Questions have been answered.  All parties agreeable. Please see the history and physical in the medical record for more information.  MEDICATIONS: Versed 8 mg IV and Demerol 100 mg IV in divided doses. Zofran 4 mg IV  DESCRIPTION OF PROCEDURE:  After a digital rectal exam was performed, the EC-[id number] ([id number])  colonoscope was advanced from the anus through the rectum and colon to the area of the cecum, ileocecal valve and appendiceal orifice.  The cecum was deeply intubated.  These structures were well-seen and photographed for the record.  From the level of the cecum and ileocecal valve, the scope was slowly and cautiously withdrawn.  The mucosal surfaces were carefully surveyed utilizing scope tip deflection to facilitate fold flattening as needed.  The scope was pulled down into the rectum where a thorough examination including retroflexion was performed.    FINDINGS:  Adequate preparation. Normal rectum. Few scattered pancolonic diverticula.Multiple areas of fibrotic-appearing, eroded mucosa spanning a 15-20 cm segment of the descending colon; otherwise, the remainder of the colonic mucosa appeared normal. The distal 10 cm of terminal ileal mucosa appeared normal as well.  THERAPEUTIC / DIAGNOSTIC  MANEUVERS PERFORMED:  biopsies of the abnormal colonic mucosa taken for histologic study.  COMPLICATIONS: none  CECAL WITHDRAWAL TIME:  10 minutes  IMPRESSION:  Focal left colonic inflammation,  likely the remnants of a recent bout of ischemic or segmental colitis-status post biopsy. Pancolonic diverticulosis  RECOMMENDATIONS:  Repeat colonoscopy in 5 years given family history of colon polyps. Further recommendations to follow pending review of pathology report. See EGD report.  Followup with [facility] or the [facility] specialist regarding your back complaints.   _______________________________ eSigned:  R. Garfield Cornea, MD FACP [facility] 05/29/2013 12:22 PM   CC:    PATIENT NAME:  [name] MR#: [id number]

## 2013-05-29 NOTE — Interval H&P Note (Signed)
History and Physical Interval Note:  05/29/2013 11:19 AM  Brittany Goodman  has presented today for surgery, with the diagnosis of CHRONIC GERD, EPIGASTRIC RUQ PAIN, DYSPHAGIA  The various methods of treatment have been discussed with the patient and family. After consideration of risks, benefits and other options for treatment, the patient has consented to  Procedure(s) with comments: COLONOSCOPY (N/A) - 12:45-moved to 11:15 Brittany Goodman notified pt ESOPHAGOGASTRODUODENOSCOPY (EGD) WITH ESOPHAGEAL DILATION (N/A) as a surgical intervention .  The patient's history has been reviewed, patient examined, no change in status, stable for surgery.  I have reviewed the patient's chart and labs.  Questions were answered to the patient's satisfaction.     Brittany Goodman  EGD with possible esophageal dilation and screening colonoscopy per plan.The risks, benefits, limitations, imponderables and alternatives regarding both EGD and colonoscopy have been reviewed with the patient. Questions have been answered. All parties agreeable.

## 2013-05-29 NOTE — H&P (View-Only) (Signed)
Primary Care Physician:  Collene Mares, PA-C  Primary Gastroenterologist:  Garfield Cornea, MD   Chief Complaint  Patient presents with  . Abdominal Pain    knot on right side of back and pain in center of chest  . Colonoscopy    HPI:  Brittany Goodman is a 67 y.o. female here for further evaluation of abdominal pain and to schedule colonoscopy. She has never had a colonoscopy. Currently taking Doxycyline for RMSF, before that took ten days of Augmentin. Fullness in right back/flank. Started about one year ago. Initially told that she had a muscle spasm. She's been on Flexeril chronically. She has a history of scoliosis. RUQ pain for couple of months. Nothing seems to aggravate it. Some days worse than others. Not real active. Excedrine migraine if real bad pain, seems to help. Does not take it every day. Takes Tylenol for pain as well. Appetite ok. Few waves of nausea without vomiting. Prilosec controls heartburn. >5 years of GERD. No prior EGD. Some esophageal dysphagia, dry foods, breads. BM generally more constipated before the Augmentin. Then some blood and mucous. Doing ok with Doxycyline. No diarrhea, no further bleeding. Eating yogurt.   Goes to [facility] for nerve blocks for spinal stenosis. Just went 04/26/13.   Current Outpatient Prescriptions  Medication Sig Dispense Refill  . acetaminophen (TYLENOL) 325 MG tablet Take 650 mg by mouth every 6 (six) hours as needed for pain.      Marland Kitchen aspirin-acetaminophen-caffeine (EXCEDRIN MIGRAINE) 250-250-65 MG per tablet Take 1 tablet by mouth every 6 (six) hours as needed for pain.      Marland Kitchen atenolol (TENORMIN) 100 MG tablet Take 100 mg by mouth daily.        . citalopram (CELEXA) 20 MG tablet Take 20 mg by mouth daily.        . cyclobenzaprine (FLEXERIL) 10 MG tablet Take 10 mg by mouth 3 (three) times daily as needed.        . doxycycline (VIBRA-TABS) 100 MG tablet       . glimepiride (AMARYL) 1 MG tablet Take 1 mg by mouth daily before breakfast.       .  levothyroxine (SYNTHROID, LEVOTHROID) 100 MCG tablet Take 100 mcg by mouth daily.        Marland Kitchen lisinopril (PRINIVIL,ZESTRIL) 5 MG tablet Take 5 mg by mouth daily.       Marland Kitchen omeprazole (PRILOSEC) 20 MG capsule Take 20 mg by mouth daily.       . vitamin B-12 (CYANOCOBALAMIN) 100 MCG tablet Take 50 mcg by mouth daily.        . polyethylene glycol-electrolytes (TRILYTE) 420 G solution Take 4,000 mLs by mouth as directed.  4000 mL  0   No current facility-administered medications for this visit.    Allergies as of 05/06/2013 - Review Complete 05/06/2013  Allergen Reaction Noted  . Codeine  08/04/2009  . Dilaudid (hydromorphone hcl)  05/06/2013  . Morphine  08/04/2009  . Tetracycline  08/04/2009    Past Medical History  Diagnosis Date  . Arthritis   . Diabetes mellitus type II   . Hyperlipidemia   . Hypertension   . Spinal stenosis   . Hypothyroidism   . GERD (gastroesophageal reflux disease)   . Depression   . Sepsis     2011, Escherichia coli pyelonephritis  . Diverticulosis     Past Surgical History  Procedure Laterality Date  . Total abdominal hysterectomy    . Back surgery    .  Carpal tunnel release    . Iliotibial band release      Family History  Problem Relation Age of Onset  . Heart disease Father   . Heart attack Father 31  . Heart disease Brother   . Colon cancer Neg Hx   . Colon polyps Brother     History   Social History  . Marital Status: Widowed    Spouse Name: N/A    Number of Children: N/A  . Years of Education: N/A   Occupational History  . Not on file.   Social History Main Topics  . Smoking status: Never Smoker   . Smokeless tobacco: Not on file  . Alcohol Use: No  . Drug Use: No  . Sexually Active: Not on file   Other Topics Concern  . Not on file   Social History Narrative   No regular exercise      ROS:  General: Negative for anorexia, weight loss, fever, chills, fatigue, weakness. Eyes: Negative for vision changes.  ENT:  Negative for hoarseness, difficulty swallowing , nasal congestion. CV: Negative for chest pain, angina, palpitations, dyspnea on exertion, peripheral edema.  Respiratory: Negative for dyspnea at rest, dyspnea on exertion, cough, sputum, wheezing.  GI: See history of present illness. GU:  Negative for dysuria, hematuria, urinary incontinence, urinary frequency, nocturnal urination.  MS: Positive for joint pain, low back pain.  Derm: Negative for rash or itching.  Neuro: Negative for weakness, abnormal sensation, seizure, frequent headaches, memory loss, confusion.  Psych: Negative for anxiety, depression, suicidal ideation, hallucinations.  Endo: Negative for unusual weight change.  Heme: Negative for bruising or bleeding. Allergy: Negative for rash or hives.    Physical Examination:  BP 144/86  Pulse 70  Temp(Src) 98.1 F (36.7 C) (Oral)  Ht 5' 5"  (1.651 m)  Wt 189 lb 9.6 oz (86.002 kg)  BMI 31.55 kg/m2   General: Well-nourished, well-developed in no acute distress.  Head: Normocephalic, atraumatic.   Eyes: Conjunctiva pink, no icterus. Mouth: Oropharyngeal mucosa moist and pink , no lesions erythema or exudate. Neck: Supple without thyromegaly, masses, or lymphadenopathy.  Lungs: Clear to auscultation bilaterally.  Heart: Regular rate and rhythm, no murmurs rubs or gallops.  Abdomen: Bowel sounds are normal, mild to moderate epigastric tenderness, nondistended, no hepatosplenomegaly or masses, no abdominal bruits or hernia , no rebound or guarding.  Right lower back, baseball-sized hard area, questionable bony. Rectal: Deferred Extremities: No lower extremity edema. No clubbing or deformities.  Neuro: Alert and oriented x 4 , grossly normal neurologically.  Skin: Warm and dry, no rash or jaundice.   Psych: Alert and cooperative, normal mood and affect.  Labs: Labs from 04/18/2013. Sodium 136, potassium 4.3, glucose 84, BUN 14, creatinine 0.85, total bilirubin 0.5, alkaline  phosphatase 57, AST 24, ALT 21, albumin 3.8, white blood cell count 5700, hemoglobin 13.1, hematocrit 38.9, MCV 86.3, platelets 225,000, Rocky Mountain spotted fever IgM 1.08 equivocal, IgG 0.86 equivocal  Imaging Studies: No results found.

## 2013-05-29 NOTE — Op Note (Signed)
[place], [address]   ENDOSCOPY PROCEDURE REPORT  PATIENT: Brittany Goodman, Brittany Goodman.  MR#: [id number] BIRTHDATE: 06/24/1946 , 66  yrs. old GENDER: Female ENDOSCOPIST: R.  Garfield Cornea, MD FACP [facility] REFERRED BY:  Collene Mares, PA-C PROCEDURE DATE:  05/29/2013 PROCEDURE:     EGD with Venia Minks dilation followed by gastric biopsy  INDICATIONS:     Epigastric and right upper quadrant abdominal pain. Esophageal dysphagia.  INFORMED CONSENT:   The risks, benefits, limitations, alternatives and imponderables have been discussed.  The potential for biopsy, esophogeal dilation, etc. have also been reviewed.  Questions have been answered.  All parties agreeable.  Please see the history and physical in the medical record for more information.  MEDICATIONS:    Versed 5 mg IV and Demerol 100 mg IV in divided doses. Cetacaine spray. Zofran 4 mg IV  DESCRIPTION OF PROCEDURE:   The EG-[id number] ([id number])  endoscope was introduced through the mouth and advanced to the second portion of the duodenum without difficulty or limitations.  The mucosal surfaces were surveyed very carefully during advancement of the scope and upon withdrawal.  Retroflexion view of the proximal stomach and esophagogastric junction was performed.      FINDINGS: Normal-appearing, patent tubular esophagus. Stomach empty. Small hiatal hernia. Multiple antral erosions. No ulcer or infiltrating process. Pylorus patent. Normal first and second portion of the duodenum.  THERAPEUTIC / DIAGNOSTIC MANEUVERS PERFORMED:  A 54 French Maloney dilator was  passed to full insertion easily. A look back revealed no apparent complication related to this maneuver. Subsequently, biopsies of the abnormal-appearing antrum taken for histologic study.   COMPLICATIONS:  None  IMPRESSION:    Normal esophagus  -   status post passage of a Maloney dilator. Hiatal hernia. Antral erosions-status post  biopsy  RECOMMENDATIONS:   Followup on pathology. See colonoscopy report.    _______________________________ R. Garfield Cornea, MD FACP [facility] eSigned:  R. Garfield Cornea, MD FACP [facility] 05/29/2013 11:46 AM     CC:

## 2013-06-02 ENCOUNTER — Encounter: Payer: Self-pay | Admitting: Internal Medicine

## 2013-06-03 ENCOUNTER — Encounter (HOSPITAL_COMMUNITY): Payer: Self-pay | Admitting: Internal Medicine

## 2013-06-11 ENCOUNTER — Ambulatory Visit (INDEPENDENT_AMBULATORY_CARE_PROVIDER_SITE_OTHER): Payer: Medicare Other | Admitting: Internal Medicine

## 2013-06-11 VITALS — Temp 98.4°F | Wt 195.0 lb

## 2013-06-11 DIAGNOSIS — T148 Other injury of unspecified body region: Secondary | ICD-10-CM | POA: Diagnosis not present

## 2013-06-11 DIAGNOSIS — W57XXXA Bitten or stung by nonvenomous insect and other nonvenomous arthropods, initial encounter: Secondary | ICD-10-CM

## 2013-06-11 DIAGNOSIS — Z23 Encounter for immunization: Secondary | ICD-10-CM | POA: Diagnosis not present

## 2013-06-11 NOTE — Progress Notes (Signed)
[facility] NOTE  RFV: community referral for evaulation of RMSF by Dr. Hilma Favors Subjective:    Patient ID: Brittany Goodman, female    DOB: 1946/03/07, 67 y.o.   MRN: [id number]  HPI 67yo F with with DM and DDD, reports having a bruised tick bite on left hip in early May. She then noticed red splotches on abdomen and legs, unable to walk due to fatigue, did have 1 episode of chills/fever at this time at end of may 2014, was worried about being ill thus saw her PCP. She reports having 3 tick bites this year. She noticed rash belly initially on 10th of May Placed on augmentin, then placed doxycycline x 2 courses. Blood work at that time had equivocal RMSF. Felt better after antibiotics, but still has splotches on her lower extremities. Still feels decreased energy, decreased mood. Low back pain from degenerative disc disease, so she has steroids injection twice to 3x year. She takes excedrin migrain plus tylenol twice a day for pain. No fever, chills, nightsweats, myalgia, arthralgias Allergies  Allergen Reactions  . Codeine   . Dilaudid (Hydromorphone Hcl)     hallucination  . Morphine     vomiting  . Tetracycline     vomiting     Current Outpatient Prescriptions on File Prior to Visit  Medication Sig Dispense Refill  . acetaminophen (TYLENOL) 325 MG tablet Take 325 mg by mouth every 6 (six) hours as needed for pain.       Marland Kitchen aspirin-acetaminophen-caffeine (EXCEDRIN MIGRAINE) 250-250-65 MG per tablet Take 1 tablet by mouth every 6 (six) hours as needed for pain.      Marland Kitchen atenolol (TENORMIN) 100 MG tablet Take 100 mg by mouth daily.        . citalopram (CELEXA) 20 MG tablet Take 20 mg by mouth every other day.       . cyclobenzaprine (FLEXERIL) 10 MG tablet Take 10 mg by mouth 3 (three) times daily as needed.        Marland Kitchen glimepiride (AMARYL) 1 MG tablet Take 1 mg by mouth daily before breakfast.       . levothyroxine (SYNTHROID, LEVOTHROID) 100 MCG tablet Take 100 mcg by mouth daily.        Marland Kitchen  lisinopril (PRINIVIL,ZESTRIL) 5 MG tablet Take 5 mg by mouth daily.       Marland Kitchen omeprazole (PRILOSEC) 20 MG capsule Take 20 mg by mouth daily.        No current facility-administered medications on file prior to visit.   Active Ambulatory Problems    Diagnosis Date Noted  . MIXED HYPERLIPIDEMIA 08/05/2009  . HYPERTENSION, BENIGN ESSENTIAL 08/05/2009  . DYSPNEA 09/01/2009  . PRECORDIAL PAIN 08/05/2009  . RUQ pain 05/06/2013  . Abdominal pain, epigastric 05/06/2013  . Esophageal dysphagia 05/06/2013  . GERD (gastroesophageal reflux disease) 05/06/2013  . Family history of polyps in the colon 05/06/2013   Resolved Ambulatory Problems    Diagnosis Date Noted  . No Resolved Ambulatory Problems   Past Medical History  Diagnosis Date  . Arthritis   . Diabetes mellitus type II   . Hyperlipidemia   . Hypertension   . Spinal stenosis   . Hypothyroidism   . Depression   . Sepsis   . Diverticulosis    History  Substance Use Topics  . Smoking status: Never Smoker   . Smokeless tobacco: Not on file  . Alcohol Use: No  family history includes Colon polyps in her brother; Heart attack (age of  onset: 35) in her father; and Heart disease in her brother and father.  There is no history of Colon cancer.   Review of Systems  Constitutional: + fatigue, + myalgia. Negative for fever, chills, diaphoresis, activity change, appetite change, fatigue and unexpected weight change.  HENT: Negative for congestion, sore throat, rhinorrhea, sneezing, trouble swallowing and sinus pressure.  Eyes: Negative for photophobia and visual disturbance.  Respiratory: Negative for cough, chest tightness, shortness of breath, wheezing and stridor.  Cardiovascular: Negative for chest pain, palpitations and leg swelling.  Gastrointestinal: Negative for nausea, vomiting, abdominal pain, diarrhea, constipation, blood in stool, abdominal distention and anal bleeding.  Genitourinary: Negative for dysuria, hematuria, flank  pain and difficulty urinating.  Musculoskeletal: positive for back pain.Negative for joint swelling, arthralgias and gait problem.  Skin: Negative for color change, pallor, rash and wound.  Neurological: Negative for dizziness, tremors, weakness and light-headedness.  Hematological: Negative for adenopathy. Does not bruise/bleed easily.  Psychiatric/Behavioral: Negative for behavioral problems, confusion, sleep disturbance, dysphoric mood, decreased concentration and agitation.       Objective:   Physical Exam Temp(Src) 98.4 F (36.9 C) (Oral)  Wt 195 lb (88.451 kg)  BMI 32.45 kg/m2 Physical Exam  Constitutional: He is oriented to person, place, and time. He appears well-developed and well-nourished. No distress.  HENT:  Mouth/Throat: Oropharynx is clear and moist. No oropharyngeal exudate.  Cardiovascular: Normal rate, regular rhythm and normal heart sounds. Exam reveals no gallop and no friction rub.  No murmur heard.  Pulmonary/Chest: Effort normal and breath sounds normal. No respiratory distress. He has no wheezes.  Abdominal: Soft. Bowel sounds are normal. He exhibits no distension. There is no tenderness.  Lymphadenopathy:  He has no cervical adenopathy.  Neurological: He is alert and oriented to person, place, and time.  Skin: Skin is warm and dry. No rash noted. No erythema. Dry lower extremities, mild erythamatous plaque no specific pattern, non blanching Psychiatric: He has a normal mood and affect. His behavior is normal.      Assessment & Plan:  RMSF = will check convalescent titers for RMSF since initial labs showed equivocal IgM and IgG today. No need for further antibiotics treatment.  Degenerative disc disease = to follow up with [place] next appt in Sept

## 2013-06-12 LAB — ROCKY MTN SPOTTED FVR ABS PNL(IGG+IGM)
RMSF IgG: 0.43 IV
RMSF IgM: 0.47 IV

## 2013-07-11 ENCOUNTER — Other Ambulatory Visit (HOSPITAL_COMMUNITY): Payer: Self-pay | Admitting: Physician Assistant

## 2013-07-11 DIAGNOSIS — Z780 Asymptomatic menopausal state: Secondary | ICD-10-CM

## 2013-07-11 DIAGNOSIS — M549 Dorsalgia, unspecified: Secondary | ICD-10-CM

## 2013-07-11 DIAGNOSIS — Z6832 Body mass index (BMI) 32.0-32.9, adult: Secondary | ICD-10-CM | POA: Diagnosis not present

## 2013-07-11 DIAGNOSIS — R300 Dysuria: Secondary | ICD-10-CM | POA: Diagnosis not present

## 2013-07-11 DIAGNOSIS — Z0000 Encounter for general adult medical examination without abnormal findings: Secondary | ICD-10-CM

## 2013-07-12 ENCOUNTER — Other Ambulatory Visit (HOSPITAL_COMMUNITY): Payer: Self-pay | Admitting: Physician Assistant

## 2013-07-12 DIAGNOSIS — R300 Dysuria: Secondary | ICD-10-CM

## 2013-07-12 DIAGNOSIS — M549 Dorsalgia, unspecified: Secondary | ICD-10-CM

## 2013-07-15 ENCOUNTER — Other Ambulatory Visit (HOSPITAL_COMMUNITY): Payer: Self-pay | Admitting: Physician Assistant

## 2013-07-15 ENCOUNTER — Ambulatory Visit (HOSPITAL_COMMUNITY)
Admission: RE | Admit: 2013-07-15 | Discharge: 2013-07-15 | Disposition: A | Discharge status: Home / Self Care | Payer: Medicare Other | Source: Ambulatory Visit | Attending: Physician Assistant | Admitting: Physician Assistant

## 2013-07-15 DIAGNOSIS — Z780 Asymptomatic menopausal state: Secondary | ICD-10-CM | POA: Insufficient documentation

## 2013-07-15 DIAGNOSIS — Z0000 Encounter for general adult medical examination without abnormal findings: Secondary | ICD-10-CM

## 2013-07-15 DIAGNOSIS — M549 Dorsalgia, unspecified: Secondary | ICD-10-CM

## 2013-07-15 DIAGNOSIS — R109 Unspecified abdominal pain: Secondary | ICD-10-CM

## 2013-07-15 DIAGNOSIS — R300 Dysuria: Secondary | ICD-10-CM

## 2013-07-15 DIAGNOSIS — Z13820 Encounter for screening for osteoporosis: Secondary | ICD-10-CM | POA: Diagnosis not present

## 2013-07-15 DIAGNOSIS — G8929 Other chronic pain: Secondary | ICD-10-CM

## 2013-07-18 ENCOUNTER — Ambulatory Visit (HOSPITAL_COMMUNITY)
Admission: RE | Admit: 2013-07-18 | Discharge: 2013-07-18 | Disposition: A | Discharge status: Home / Self Care | Payer: Medicare Other | Source: Ambulatory Visit | Attending: Physician Assistant | Admitting: Physician Assistant

## 2013-07-18 DIAGNOSIS — R109 Unspecified abdominal pain: Secondary | ICD-10-CM

## 2013-07-18 DIAGNOSIS — R1031 Right lower quadrant pain: Secondary | ICD-10-CM | POA: Diagnosis not present

## 2013-07-18 DIAGNOSIS — R911 Solitary pulmonary nodule: Secondary | ICD-10-CM | POA: Diagnosis not present

## 2013-07-18 MED ORDER — IOHEXOL 300 MG/ML  SOLN
100.0000 mL | Freq: Once | INTRAMUSCULAR | Status: AC | PRN
  Administered 2013-07-18: 100 mL via INTRAVENOUS

## 2013-08-14 ENCOUNTER — Other Ambulatory Visit (HOSPITAL_COMMUNITY): Payer: Self-pay | Admitting: Physician Assistant

## 2013-08-14 DIAGNOSIS — Z09 Encounter for follow-up examination after completed treatment for conditions other than malignant neoplasm: Secondary | ICD-10-CM

## 2013-08-14 DIAGNOSIS — Z139 Encounter for screening, unspecified: Secondary | ICD-10-CM

## 2013-08-14 DIAGNOSIS — R922 Inconclusive mammogram: Secondary | ICD-10-CM

## 2013-08-28 DIAGNOSIS — E119 Type 2 diabetes mellitus without complications: Secondary | ICD-10-CM | POA: Diagnosis not present

## 2013-08-28 DIAGNOSIS — Z6832 Body mass index (BMI) 32.0-32.9, adult: Secondary | ICD-10-CM | POA: Diagnosis not present

## 2013-08-28 DIAGNOSIS — I10 Essential (primary) hypertension: Secondary | ICD-10-CM | POA: Diagnosis not present

## 2013-08-28 DIAGNOSIS — Z23 Encounter for immunization: Secondary | ICD-10-CM | POA: Diagnosis not present

## 2013-08-28 DIAGNOSIS — E669 Obesity, unspecified: Secondary | ICD-10-CM | POA: Diagnosis not present

## 2013-09-17 DIAGNOSIS — L0291 Cutaneous abscess, unspecified: Secondary | ICD-10-CM | POA: Diagnosis not present

## 2013-09-17 DIAGNOSIS — Z6832 Body mass index (BMI) 32.0-32.9, adult: Secondary | ICD-10-CM | POA: Diagnosis not present

## 2013-09-18 ENCOUNTER — Ambulatory Visit (HOSPITAL_COMMUNITY)
Admission: RE | Admit: 2013-09-18 | Discharge: 2013-09-18 | Disposition: A | Discharge status: Home / Self Care | Payer: Medicare Other | Source: Ambulatory Visit | Attending: Physician Assistant | Admitting: Physician Assistant

## 2013-09-18 DIAGNOSIS — R922 Inconclusive mammogram: Secondary | ICD-10-CM

## 2013-09-23 ENCOUNTER — Other Ambulatory Visit (HOSPITAL_COMMUNITY): Payer: Self-pay | Admitting: Physician Assistant

## 2013-09-23 ENCOUNTER — Other Ambulatory Visit: Payer: Self-pay | Admitting: Physician Assistant

## 2013-09-23 ENCOUNTER — Other Ambulatory Visit: Payer: Self-pay | Admitting: Family Medicine

## 2013-09-23 DIAGNOSIS — N6002 Solitary cyst of left breast: Secondary | ICD-10-CM

## 2013-09-24 ENCOUNTER — Other Ambulatory Visit: Payer: Self-pay | Admitting: Family Medicine

## 2013-09-26 ENCOUNTER — Other Ambulatory Visit (HOSPITAL_COMMUNITY): Payer: Self-pay | Admitting: Family Medicine

## 2013-09-26 DIAGNOSIS — M545 Low back pain: Secondary | ICD-10-CM | POA: Diagnosis not present

## 2013-09-26 DIAGNOSIS — IMO0002 Reserved for concepts with insufficient information to code with codable children: Secondary | ICD-10-CM | POA: Diagnosis not present

## 2013-09-26 DIAGNOSIS — M48061 Spinal stenosis, lumbar region without neurogenic claudication: Secondary | ICD-10-CM | POA: Diagnosis not present

## 2013-09-26 DIAGNOSIS — N6002 Solitary cyst of left breast: Secondary | ICD-10-CM

## 2013-10-07 ENCOUNTER — Other Ambulatory Visit: Payer: Medicare Other

## 2013-10-09 ENCOUNTER — Ambulatory Visit (HOSPITAL_COMMUNITY)
Admission: RE | Admit: 2013-10-09 | Discharge: 2013-10-09 | Disposition: A | Discharge status: Home / Self Care | Payer: Medicare Other | Source: Ambulatory Visit | Attending: Family Medicine | Admitting: Family Medicine

## 2013-10-09 DIAGNOSIS — R928 Other abnormal and inconclusive findings on diagnostic imaging of breast: Secondary | ICD-10-CM | POA: Diagnosis not present

## 2013-10-09 DIAGNOSIS — N6002 Solitary cyst of left breast: Secondary | ICD-10-CM

## 2013-10-09 DIAGNOSIS — R922 Inconclusive mammogram: Secondary | ICD-10-CM | POA: Diagnosis not present

## 2013-10-14 DIAGNOSIS — M5137 Other intervertebral disc degeneration, lumbosacral region: Secondary | ICD-10-CM | POA: Diagnosis not present

## 2013-10-14 DIAGNOSIS — Z6832 Body mass index (BMI) 32.0-32.9, adult: Secondary | ICD-10-CM | POA: Diagnosis not present

## 2013-10-14 DIAGNOSIS — M5430 Sciatica, unspecified side: Secondary | ICD-10-CM | POA: Diagnosis not present

## 2013-11-12 DIAGNOSIS — Z23 Encounter for immunization: Secondary | ICD-10-CM | POA: Diagnosis not present

## 2014-01-11 ENCOUNTER — Emergency Department (HOSPITAL_COMMUNITY)
Admission: EM | Admit: 2014-01-11 | Discharge: 2014-01-11 | Disposition: A | Discharge status: Home / Self Care | Payer: Medicare Other | Attending: Emergency Medicine | Admitting: Emergency Medicine

## 2014-01-11 ENCOUNTER — Encounter (HOSPITAL_COMMUNITY): Payer: Self-pay | Admitting: Emergency Medicine

## 2014-01-11 ENCOUNTER — Emergency Department (HOSPITAL_COMMUNITY): Payer: Medicare Other

## 2014-01-11 DIAGNOSIS — Z79899 Other long term (current) drug therapy: Secondary | ICD-10-CM | POA: Diagnosis not present

## 2014-01-11 DIAGNOSIS — R109 Unspecified abdominal pain: Secondary | ICD-10-CM | POA: Diagnosis not present

## 2014-01-11 DIAGNOSIS — I10 Essential (primary) hypertension: Secondary | ICD-10-CM | POA: Diagnosis not present

## 2014-01-11 DIAGNOSIS — F3289 Other specified depressive episodes: Secondary | ICD-10-CM | POA: Insufficient documentation

## 2014-01-11 DIAGNOSIS — E119 Type 2 diabetes mellitus without complications: Secondary | ICD-10-CM | POA: Diagnosis not present

## 2014-01-11 DIAGNOSIS — E039 Hypothyroidism, unspecified: Secondary | ICD-10-CM | POA: Diagnosis not present

## 2014-01-11 DIAGNOSIS — R05 Cough: Secondary | ICD-10-CM | POA: Diagnosis not present

## 2014-01-11 DIAGNOSIS — M129 Arthropathy, unspecified: Secondary | ICD-10-CM | POA: Insufficient documentation

## 2014-01-11 DIAGNOSIS — K219 Gastro-esophageal reflux disease without esophagitis: Secondary | ICD-10-CM | POA: Insufficient documentation

## 2014-01-11 DIAGNOSIS — J069 Acute upper respiratory infection, unspecified: Secondary | ICD-10-CM | POA: Insufficient documentation

## 2014-01-11 DIAGNOSIS — F329 Major depressive disorder, single episode, unspecified: Secondary | ICD-10-CM | POA: Insufficient documentation

## 2014-01-11 DIAGNOSIS — R059 Cough, unspecified: Secondary | ICD-10-CM

## 2014-01-11 MED ORDER — BENZONATATE 100 MG PO CAPS: 100.0000 mg | ORAL_CAPSULE | Freq: Three times a day (TID) | ORAL | Status: DC | PRN

## 2014-01-11 NOTE — ED Notes (Signed)
Pt c/o cough, congestion, fever that started Wednesday, cough is productive "a little" unsure of any color to sputum, rib and abd sore due to coughing, pt states that she became sob this am with the coughing,

## 2014-01-11 NOTE — ED Provider Notes (Signed)
CSN: [id number]     Arrival date & time 01/11/14  1975 History   First MD Initiated Contact with Patient 01/11/14 0940   This chart was scribed for Ephraim Hamburger, MD by Roxan Diesel, ED scribe.  This patient was seen in room [facility]04/[facility]04 and the patient's care was started at 9:48 AM.    Chief Complaint  Patient presents with  . Cough   The history is provided by the patient. No language interpreter was used.   HPI Comments: Brittany Goodman is a 68 y.o. female with history of DM,  who presents to the Emergency Department  complaining of several days of persistent cough with associated chest congestion and SOB with coughing.  Pt states that her cough began 4 days ago.  Yesterday and today she awoke with SOB due to her coughing spells. She has no SOB when not coughing, including with walking. She also complains of subjective fever, chills, sore throat, ear pain, and mild abdominal soreness when coughing.  Pt denies rhinorrhea; nasal congestion, leg swelling; chest pain, hemoptysis, or vomiting.  Pt reports that she has been in bed for the past three days.      Past Medical History  Diagnosis Date  . Arthritis   . Diabetes mellitus type II   . Hyperlipidemia   . Hypertension   . Spinal stenosis   . Hypothyroidism   . GERD (gastroesophageal reflux disease)   . Depression   . Sepsis     2011, Escherichia coli pyelonephritis  . Diverticulosis    Past Surgical History  Procedure Laterality Date  . Total abdominal hysterectomy    . Back surgery    . Carpal tunnel release    . Iliotibial band release    . Colonoscopy N/A 05/29/2013    Procedure: COLONOSCOPY;  Surgeon: Daneil Dolin, MD;  Location: [facility];  Service: Endoscopy;  Laterality: N/A;  12:45-moved to 11:15 [name] notified pt  . Esophagogastroduodenoscopy (egd) with esophageal dilation N/A 05/29/2013    Procedure: ESOPHAGOGASTRODUODENOSCOPY (EGD) WITH ESOPHAGEAL DILATION;  Surgeon: Daneil Dolin, MD;  Location: [facility];  Service: Endoscopy;  Laterality: N/A;   Family History  Problem Relation Age of Onset  . Heart disease Father   . Heart attack Father 67  . Heart disease Brother   . Colon cancer Neg Hx   . Colon polyps Brother    History  Substance Use Topics  . Smoking status: Never Smoker   . Smokeless tobacco: Not on file  . Alcohol Use: No   OB History   Grav Para Term Preterm Abortions TAB SAB Ect Mult Living                 Review of Systems  Constitutional: Positive for fever and chills.  HENT: Positive for sore throat. Negative for rhinorrhea.   Respiratory: Positive for cough and shortness of breath (Results when coughing).   Cardiovascular: Negative for chest pain and leg swelling.  Gastrointestinal: Positive for abdominal pain (Mild soreness when coughing ).  All other systems reviewed and are negative.      Allergies  Codeine; Dilaudid; Morphine; and Tetracycline  Home Medications   Current Outpatient Rx  Name  Route  Sig  Dispense  Refill  . acetaminophen (TYLENOL) 325 MG tablet   Oral   Take 325 mg by mouth every 6 (six) hours as needed for pain.          Marland Kitchen aspirin-acetaminophen-caffeine ([place]) [phone]  MG per tablet   Oral   Take 1 tablet by mouth every 6 (six) hours as needed for pain.         Marland Kitchen atenolol (TENORMIN) 100 MG tablet   Oral   Take 100 mg by mouth daily.           . citalopram (CELEXA) 20 MG tablet   Oral   Take 20 mg by mouth every other day.          . cyclobenzaprine (FLEXERIL) 10 MG tablet   Oral   Take 10 mg by mouth 3 (three) times daily as needed.           Marland Kitchen glimepiride (AMARYL) 1 MG tablet   Oral   Take 1 mg by mouth daily before breakfast.          . levothyroxine (SYNTHROID, LEVOTHROID) 100 MCG tablet   Oral   Take 100 mcg by mouth daily.           Marland Kitchen lisinopril (PRINIVIL,ZESTRIL) 5 MG tablet   Oral   Take 5 mg by mouth daily.          Marland Kitchen omeprazole (PRILOSEC) 20 MG capsule    Oral   Take 20 mg by mouth daily.           BP 145/85  Pulse 88  Temp(Src) 98.4 F (36.9 C) (Oral)  Resp 18  Ht 5' 5"  (1.651 m)  SpO2 95% Physical Exam  Nursing note and vitals reviewed. Constitutional: She is oriented to person, place, and time. She appears well-developed and well-nourished. No distress.  HENT:  Head: Normocephalic and atraumatic.  Right Ear: Tympanic membrane and external ear normal.  Left Ear: Tympanic membrane and external ear normal.  Eyes: EOM are normal.  Neck: Neck supple. No tracheal deviation present.  Cardiovascular: Normal rate, regular rhythm and normal heart sounds.   Pulmonary/Chest: Effort normal. No respiratory distress. She has no wheezes. She has no rales.  Abdominal: There is no tenderness.  Musculoskeletal: Normal range of motion. She exhibits no edema.  Neurological: She is alert and oriented to person, place, and time.  Skin: Skin is warm and dry.  Psychiatric: She has a normal mood and affect. Her behavior is normal.    ED Course  Procedures (including critical care time)  DIAGNOSTIC STUDIES: Oxygen Saturation is 95% on room air, adequate by my interpretation.    COORDINATION OF CARE:  9:53 AM-Discussed treatment plan which includes CXR with pt at bedside and pt agreed to plan.   Labs Review Labs Reviewed - No data to display Imaging Review Dg Chest 2 View  01/11/2014   CLINICAL DATA:  Cough and fever  EXAM: CHEST  2 VIEW  COMPARISON:  05/27/2010  FINDINGS: The heart size and mediastinal contours are within normal limits. Both lungs are clear. The visualized skeletal structures are unremarkable.  IMPRESSION: No active cardiopulmonary disease.   Electronically Signed   By: Daryll Brod M.D.   On: 01/11/2014 10:33    EKG Interpretation   None       MDM   Final diagnoses:  Cough  Upper respiratory infection    Patient with URI sx and cough. No PNA, hypoxia, dyspnea or tachypnea. Will given tessalon pearles, and feel  she is stable for discharge. She understands return precautions, including high fever, vomiting, dyspnea or other concerning sx.   I personally performed the services described in this documentation, which was scribed in my presence. The recorded information  has been reviewed and is accurate.     Ephraim Hamburger, MD 01/11/14 [phone]

## 2014-01-11 NOTE — ED Notes (Signed)
Patient with no complaints at this time. Respirations even and unlabored. Skin warm/dry. Discharge instructions reviewed with patient at this time. Patient given opportunity to voice concerns/ask questions. Patient discharged at this time and left Emergency Department with steady gait.   

## 2014-01-11 NOTE — ED Notes (Signed)
MD at bedside. 

## 2014-01-11 NOTE — Discharge Instructions (Signed)

## 2014-01-13 DIAGNOSIS — J189 Pneumonia, unspecified organism: Secondary | ICD-10-CM | POA: Diagnosis not present

## 2014-01-13 DIAGNOSIS — Z681 Body mass index (BMI) 19 or less, adult: Secondary | ICD-10-CM | POA: Diagnosis not present

## 2014-01-27 DIAGNOSIS — Z23 Encounter for immunization: Secondary | ICD-10-CM | POA: Diagnosis not present

## 2014-01-27 DIAGNOSIS — I998 Other disorder of circulatory system: Secondary | ICD-10-CM | POA: Diagnosis not present

## 2014-01-27 DIAGNOSIS — Z6834 Body mass index (BMI) 34.0-34.9, adult: Secondary | ICD-10-CM | POA: Diagnosis not present

## 2014-02-28 DIAGNOSIS — M48061 Spinal stenosis, lumbar region without neurogenic claudication: Secondary | ICD-10-CM | POA: Diagnosis not present

## 2014-02-28 DIAGNOSIS — M47817 Spondylosis without myelopathy or radiculopathy, lumbosacral region: Secondary | ICD-10-CM | POA: Diagnosis not present

## 2014-02-28 DIAGNOSIS — IMO0002 Reserved for concepts with insufficient information to code with codable children: Secondary | ICD-10-CM | POA: Diagnosis not present

## 2014-02-28 DIAGNOSIS — M4120 Other idiopathic scoliosis, site unspecified: Secondary | ICD-10-CM | POA: Diagnosis not present

## 2014-03-17 ENCOUNTER — Ambulatory Visit (HOSPITAL_COMMUNITY)
Admission: RE | Admit: 2014-03-17 | Discharge: 2014-03-17 | Disposition: A | Discharge status: Home / Self Care | Payer: Medicare Other | Source: Ambulatory Visit | Attending: Family Medicine | Admitting: Family Medicine

## 2014-03-17 ENCOUNTER — Other Ambulatory Visit (HOSPITAL_COMMUNITY): Payer: Self-pay | Admitting: Family Medicine

## 2014-03-17 DIAGNOSIS — E669 Obesity, unspecified: Secondary | ICD-10-CM | POA: Diagnosis not present

## 2014-03-17 DIAGNOSIS — S79919A Unspecified injury of unspecified hip, initial encounter: Secondary | ICD-10-CM | POA: Diagnosis not present

## 2014-03-17 DIAGNOSIS — I10 Essential (primary) hypertension: Secondary | ICD-10-CM | POA: Diagnosis not present

## 2014-03-17 DIAGNOSIS — M25559 Pain in unspecified hip: Secondary | ICD-10-CM

## 2014-03-17 DIAGNOSIS — R911 Solitary pulmonary nodule: Secondary | ICD-10-CM | POA: Diagnosis not present

## 2014-03-17 DIAGNOSIS — Z6834 Body mass index (BMI) 34.0-34.9, adult: Secondary | ICD-10-CM | POA: Diagnosis not present

## 2014-03-17 DIAGNOSIS — E039 Hypothyroidism, unspecified: Secondary | ICD-10-CM | POA: Diagnosis not present

## 2014-03-17 DIAGNOSIS — IMO0001 Reserved for inherently not codable concepts without codable children: Secondary | ICD-10-CM | POA: Diagnosis not present

## 2014-03-17 DIAGNOSIS — E119 Type 2 diabetes mellitus without complications: Secondary | ICD-10-CM | POA: Diagnosis not present

## 2014-03-18 ENCOUNTER — Other Ambulatory Visit (HOSPITAL_COMMUNITY): Payer: Self-pay | Admitting: Family Medicine

## 2014-03-18 DIAGNOSIS — R911 Solitary pulmonary nodule: Secondary | ICD-10-CM

## 2014-03-20 ENCOUNTER — Ambulatory Visit (HOSPITAL_COMMUNITY)
Admission: RE | Admit: 2014-03-20 | Discharge: 2014-03-20 | Disposition: A | Discharge status: Home / Self Care | Payer: Medicare Other | Source: Ambulatory Visit | Attending: Family Medicine | Admitting: Family Medicine

## 2014-03-20 DIAGNOSIS — R059 Cough, unspecified: Secondary | ICD-10-CM | POA: Diagnosis not present

## 2014-03-20 DIAGNOSIS — R911 Solitary pulmonary nodule: Secondary | ICD-10-CM | POA: Diagnosis not present

## 2014-03-20 DIAGNOSIS — R05 Cough: Secondary | ICD-10-CM | POA: Insufficient documentation

## 2014-03-20 MED ORDER — IOHEXOL 300 MG/ML  SOLN
80.0000 mL | Freq: Once | INTRAMUSCULAR | Status: AC | PRN
  Administered 2014-03-20: 80 mL via INTRAVENOUS

## 2014-05-20 ENCOUNTER — Other Ambulatory Visit (HOSPITAL_COMMUNITY): Payer: Self-pay | Admitting: Internal Medicine

## 2014-05-20 ENCOUNTER — Ambulatory Visit (HOSPITAL_COMMUNITY)
Admission: RE | Admit: 2014-05-20 | Discharge: 2014-05-20 | Disposition: A | Discharge status: Home / Self Care | Payer: Medicare Other | Source: Ambulatory Visit | Attending: Internal Medicine | Admitting: Internal Medicine

## 2014-05-20 ENCOUNTER — Encounter (HOSPITAL_COMMUNITY): Payer: Self-pay

## 2014-05-20 DIAGNOSIS — M1990 Unspecified osteoarthritis, unspecified site: Secondary | ICD-10-CM | POA: Diagnosis not present

## 2014-05-20 DIAGNOSIS — R51 Headache: Secondary | ICD-10-CM

## 2014-05-20 DIAGNOSIS — E039 Hypothyroidism, unspecified: Secondary | ICD-10-CM | POA: Diagnosis not present

## 2014-05-20 DIAGNOSIS — Z6834 Body mass index (BMI) 34.0-34.9, adult: Secondary | ICD-10-CM | POA: Diagnosis not present

## 2014-05-20 DIAGNOSIS — G459 Transient cerebral ischemic attack, unspecified: Secondary | ICD-10-CM

## 2014-05-20 DIAGNOSIS — R4701 Aphasia: Secondary | ICD-10-CM

## 2014-05-20 DIAGNOSIS — I10 Essential (primary) hypertension: Secondary | ICD-10-CM | POA: Diagnosis not present

## 2014-05-22 ENCOUNTER — Ambulatory Visit (HOSPITAL_COMMUNITY)
Admission: RE | Admit: 2014-05-22 | Discharge: 2014-05-22 | Disposition: A | Discharge status: Home / Self Care | Payer: Medicare Other | Source: Ambulatory Visit | Attending: Internal Medicine | Admitting: Internal Medicine

## 2014-05-22 DIAGNOSIS — I658 Occlusion and stenosis of other precerebral arteries: Secondary | ICD-10-CM | POA: Diagnosis not present

## 2014-05-22 DIAGNOSIS — G459 Transient cerebral ischemic attack, unspecified: Secondary | ICD-10-CM

## 2014-05-22 DIAGNOSIS — R4701 Aphasia: Secondary | ICD-10-CM

## 2014-05-22 DIAGNOSIS — Z8673 Personal history of transient ischemic attack (TIA), and cerebral infarction without residual deficits: Secondary | ICD-10-CM | POA: Insufficient documentation

## 2014-05-27 DIAGNOSIS — R319 Hematuria, unspecified: Secondary | ICD-10-CM | POA: Diagnosis not present

## 2014-05-27 DIAGNOSIS — N390 Urinary tract infection, site not specified: Secondary | ICD-10-CM | POA: Diagnosis not present

## 2014-05-27 DIAGNOSIS — Z6834 Body mass index (BMI) 34.0-34.9, adult: Secondary | ICD-10-CM | POA: Diagnosis not present

## 2014-06-10 DIAGNOSIS — R319 Hematuria, unspecified: Secondary | ICD-10-CM | POA: Diagnosis not present

## 2014-08-11 DIAGNOSIS — Z6835 Body mass index (BMI) 35.0-35.9, adult: Secondary | ICD-10-CM | POA: Diagnosis not present

## 2014-08-11 DIAGNOSIS — Z0000 Encounter for general adult medical examination without abnormal findings: Secondary | ICD-10-CM | POA: Diagnosis not present

## 2014-08-11 DIAGNOSIS — E119 Type 2 diabetes mellitus without complications: Secondary | ICD-10-CM | POA: Diagnosis not present

## 2014-08-11 DIAGNOSIS — E039 Hypothyroidism, unspecified: Secondary | ICD-10-CM | POA: Diagnosis not present

## 2014-08-11 DIAGNOSIS — E785 Hyperlipidemia, unspecified: Secondary | ICD-10-CM | POA: Diagnosis not present

## 2014-08-15 ENCOUNTER — Other Ambulatory Visit (HOSPITAL_COMMUNITY): Payer: Self-pay | Admitting: Family Medicine

## 2014-08-15 DIAGNOSIS — R229 Localized swelling, mass and lump, unspecified: Secondary | ICD-10-CM

## 2014-08-18 ENCOUNTER — Ambulatory Visit (HOSPITAL_COMMUNITY)
Admission: RE | Admit: 2014-08-18 | Discharge: 2014-08-18 | Disposition: A | Discharge status: Home / Self Care | Payer: Medicare Other | Source: Ambulatory Visit | Attending: Family Medicine | Admitting: Family Medicine

## 2014-08-18 ENCOUNTER — Other Ambulatory Visit (HOSPITAL_COMMUNITY): Payer: Self-pay | Admitting: Family Medicine

## 2014-08-18 DIAGNOSIS — R229 Localized swelling, mass and lump, unspecified: Secondary | ICD-10-CM

## 2014-11-07 DIAGNOSIS — Z23 Encounter for immunization: Secondary | ICD-10-CM | POA: Diagnosis not present

## 2014-11-27 ENCOUNTER — Other Ambulatory Visit (HOSPITAL_COMMUNITY): Payer: Self-pay | Admitting: Family Medicine

## 2014-11-27 DIAGNOSIS — Z1231 Encounter for screening mammogram for malignant neoplasm of breast: Secondary | ICD-10-CM

## 2014-11-28 DIAGNOSIS — Z6834 Body mass index (BMI) 34.0-34.9, adult: Secondary | ICD-10-CM | POA: Diagnosis not present

## 2014-11-28 DIAGNOSIS — E782 Mixed hyperlipidemia: Secondary | ICD-10-CM | POA: Diagnosis not present

## 2014-11-28 DIAGNOSIS — I10 Essential (primary) hypertension: Secondary | ICD-10-CM | POA: Diagnosis not present

## 2014-11-28 DIAGNOSIS — E6609 Other obesity due to excess calories: Secondary | ICD-10-CM | POA: Diagnosis not present

## 2014-11-28 DIAGNOSIS — F329 Major depressive disorder, single episode, unspecified: Secondary | ICD-10-CM | POA: Diagnosis not present

## 2014-11-28 DIAGNOSIS — E039 Hypothyroidism, unspecified: Secondary | ICD-10-CM | POA: Diagnosis not present

## 2014-11-28 DIAGNOSIS — E1165 Type 2 diabetes mellitus with hyperglycemia: Secondary | ICD-10-CM | POA: Diagnosis not present

## 2014-11-28 DIAGNOSIS — L820 Inflamed seborrheic keratosis: Secondary | ICD-10-CM | POA: Diagnosis not present

## 2014-12-02 DIAGNOSIS — E1165 Type 2 diabetes mellitus with hyperglycemia: Secondary | ICD-10-CM | POA: Diagnosis not present

## 2014-12-02 DIAGNOSIS — I10 Essential (primary) hypertension: Secondary | ICD-10-CM | POA: Diagnosis not present

## 2014-12-02 DIAGNOSIS — L820 Inflamed seborrheic keratosis: Secondary | ICD-10-CM | POA: Diagnosis not present

## 2014-12-02 DIAGNOSIS — F329 Major depressive disorder, single episode, unspecified: Secondary | ICD-10-CM | POA: Diagnosis not present

## 2014-12-02 DIAGNOSIS — E782 Mixed hyperlipidemia: Secondary | ICD-10-CM | POA: Diagnosis not present

## 2014-12-02 DIAGNOSIS — Z6834 Body mass index (BMI) 34.0-34.9, adult: Secondary | ICD-10-CM | POA: Diagnosis not present

## 2014-12-02 DIAGNOSIS — E039 Hypothyroidism, unspecified: Secondary | ICD-10-CM | POA: Diagnosis not present

## 2014-12-03 ENCOUNTER — Ambulatory Visit (HOSPITAL_COMMUNITY)
Admission: RE | Admit: 2014-12-03 | Discharge: 2014-12-03 | Disposition: A | Discharge status: Home / Self Care | Payer: Medicare Other | Source: Ambulatory Visit | Attending: Family Medicine | Admitting: Family Medicine

## 2014-12-03 DIAGNOSIS — Z1231 Encounter for screening mammogram for malignant neoplasm of breast: Secondary | ICD-10-CM | POA: Diagnosis present

## 2015-04-22 ENCOUNTER — Ambulatory Visit (HOSPITAL_COMMUNITY)
Admission: RE | Admit: 2015-04-22 | Discharge: 2015-04-22 | Disposition: A | Discharge status: Home / Self Care | Payer: Medicare Other | Source: Ambulatory Visit | Attending: Family Medicine | Admitting: Family Medicine

## 2015-04-22 ENCOUNTER — Other Ambulatory Visit (HOSPITAL_COMMUNITY): Payer: Self-pay | Admitting: Family Medicine

## 2015-04-22 DIAGNOSIS — R0989 Other specified symptoms and signs involving the circulatory and respiratory systems: Secondary | ICD-10-CM | POA: Diagnosis not present

## 2015-04-22 DIAGNOSIS — G5602 Carpal tunnel syndrome, left upper limb: Secondary | ICD-10-CM | POA: Diagnosis not present

## 2015-04-22 DIAGNOSIS — E6609 Other obesity due to excess calories: Secondary | ICD-10-CM | POA: Diagnosis not present

## 2015-04-22 DIAGNOSIS — S6992XA Unspecified injury of left wrist, hand and finger(s), initial encounter: Secondary | ICD-10-CM | POA: Diagnosis not present

## 2015-04-22 DIAGNOSIS — R05 Cough: Secondary | ICD-10-CM | POA: Diagnosis not present

## 2015-04-22 DIAGNOSIS — M25532 Pain in left wrist: Secondary | ICD-10-CM

## 2015-04-22 DIAGNOSIS — E039 Hypothyroidism, unspecified: Secondary | ICD-10-CM | POA: Diagnosis not present

## 2015-04-22 DIAGNOSIS — M7989 Other specified soft tissue disorders: Secondary | ICD-10-CM | POA: Diagnosis not present

## 2015-04-22 DIAGNOSIS — Z6832 Body mass index (BMI) 32.0-32.9, adult: Secondary | ICD-10-CM | POA: Diagnosis not present

## 2015-04-22 DIAGNOSIS — M65342 Trigger finger, left ring finger: Secondary | ICD-10-CM | POA: Diagnosis not present

## 2015-06-24 DIAGNOSIS — E1165 Type 2 diabetes mellitus with hyperglycemia: Secondary | ICD-10-CM | POA: Diagnosis not present

## 2015-06-24 DIAGNOSIS — E6609 Other obesity due to excess calories: Secondary | ICD-10-CM | POA: Diagnosis not present

## 2015-06-24 DIAGNOSIS — E538 Deficiency of other specified B group vitamins: Secondary | ICD-10-CM | POA: Diagnosis not present

## 2015-06-24 DIAGNOSIS — Z1389 Encounter for screening for other disorder: Secondary | ICD-10-CM | POA: Diagnosis not present

## 2015-06-24 DIAGNOSIS — M549 Dorsalgia, unspecified: Secondary | ICD-10-CM | POA: Diagnosis not present

## 2015-06-24 DIAGNOSIS — E119 Type 2 diabetes mellitus without complications: Secondary | ICD-10-CM | POA: Diagnosis not present

## 2015-06-24 DIAGNOSIS — I10 Essential (primary) hypertension: Secondary | ICD-10-CM | POA: Diagnosis not present

## 2015-06-24 DIAGNOSIS — Z6831 Body mass index (BMI) 31.0-31.9, adult: Secondary | ICD-10-CM | POA: Diagnosis not present

## 2015-06-24 DIAGNOSIS — E039 Hypothyroidism, unspecified: Secondary | ICD-10-CM | POA: Diagnosis not present

## 2015-06-24 DIAGNOSIS — E559 Vitamin D deficiency, unspecified: Secondary | ICD-10-CM | POA: Diagnosis not present

## 2015-06-24 DIAGNOSIS — E782 Mixed hyperlipidemia: Secondary | ICD-10-CM | POA: Diagnosis not present

## 2015-08-10 DIAGNOSIS — M720 Palmar fascial fibromatosis [Dupuytren]: Secondary | ICD-10-CM | POA: Diagnosis not present

## 2015-08-10 DIAGNOSIS — M151 Heberden's nodes (with arthropathy): Secondary | ICD-10-CM | POA: Diagnosis not present

## 2015-08-10 DIAGNOSIS — M1812 Unilateral primary osteoarthritis of first carpometacarpal joint, left hand: Secondary | ICD-10-CM | POA: Diagnosis not present

## 2015-08-26 DIAGNOSIS — Z1389 Encounter for screening for other disorder: Secondary | ICD-10-CM | POA: Diagnosis not present

## 2015-08-26 DIAGNOSIS — Z23 Encounter for immunization: Secondary | ICD-10-CM | POA: Diagnosis not present

## 2015-08-26 DIAGNOSIS — E039 Hypothyroidism, unspecified: Secondary | ICD-10-CM | POA: Diagnosis not present

## 2015-08-26 DIAGNOSIS — M109 Gout, unspecified: Secondary | ICD-10-CM | POA: Diagnosis not present

## 2015-08-26 DIAGNOSIS — Z6832 Body mass index (BMI) 32.0-32.9, adult: Secondary | ICD-10-CM | POA: Diagnosis not present

## 2015-08-26 DIAGNOSIS — E782 Mixed hyperlipidemia: Secondary | ICD-10-CM | POA: Diagnosis not present

## 2015-08-26 DIAGNOSIS — E119 Type 2 diabetes mellitus without complications: Secondary | ICD-10-CM | POA: Diagnosis not present

## 2015-08-26 DIAGNOSIS — F329 Major depressive disorder, single episode, unspecified: Secondary | ICD-10-CM | POA: Diagnosis not present

## 2015-08-26 DIAGNOSIS — E6609 Other obesity due to excess calories: Secondary | ICD-10-CM | POA: Diagnosis not present

## 2015-08-28 DIAGNOSIS — M4806 Spinal stenosis, lumbar region: Secondary | ICD-10-CM | POA: Diagnosis not present

## 2015-08-28 DIAGNOSIS — M79651 Pain in right thigh: Secondary | ICD-10-CM | POA: Diagnosis not present

## 2015-08-28 DIAGNOSIS — M25551 Pain in right hip: Secondary | ICD-10-CM | POA: Diagnosis not present

## 2015-08-28 DIAGNOSIS — M79606 Pain in leg, unspecified: Secondary | ICD-10-CM | POA: Diagnosis not present

## 2015-08-28 DIAGNOSIS — M47816 Spondylosis without myelopathy or radiculopathy, lumbar region: Secondary | ICD-10-CM | POA: Diagnosis not present

## 2015-08-28 DIAGNOSIS — M4180 Other forms of scoliosis, site unspecified: Secondary | ICD-10-CM | POA: Diagnosis not present

## 2015-09-28 DIAGNOSIS — M19032 Primary osteoarthritis, left wrist: Secondary | ICD-10-CM | POA: Diagnosis not present

## 2015-10-09 DIAGNOSIS — H6123 Impacted cerumen, bilateral: Secondary | ICD-10-CM | POA: Diagnosis not present

## 2015-10-09 DIAGNOSIS — M542 Cervicalgia: Secondary | ICD-10-CM | POA: Diagnosis not present

## 2015-12-23 DIAGNOSIS — M1991 Primary osteoarthritis, unspecified site: Secondary | ICD-10-CM | POA: Diagnosis not present

## 2015-12-23 DIAGNOSIS — Z6832 Body mass index (BMI) 32.0-32.9, adult: Secondary | ICD-10-CM | POA: Diagnosis not present

## 2015-12-23 DIAGNOSIS — I10 Essential (primary) hypertension: Secondary | ICD-10-CM | POA: Diagnosis not present

## 2015-12-23 DIAGNOSIS — Z1389 Encounter for screening for other disorder: Secondary | ICD-10-CM | POA: Diagnosis not present

## 2015-12-23 DIAGNOSIS — E119 Type 2 diabetes mellitus without complications: Secondary | ICD-10-CM | POA: Diagnosis not present

## 2015-12-23 DIAGNOSIS — E6609 Other obesity due to excess calories: Secondary | ICD-10-CM | POA: Diagnosis not present

## 2016-02-04 DIAGNOSIS — Z6832 Body mass index (BMI) 32.0-32.9, adult: Secondary | ICD-10-CM | POA: Diagnosis not present

## 2016-02-04 DIAGNOSIS — E6609 Other obesity due to excess calories: Secondary | ICD-10-CM | POA: Diagnosis not present

## 2016-02-04 DIAGNOSIS — Z1389 Encounter for screening for other disorder: Secondary | ICD-10-CM | POA: Diagnosis not present

## 2016-02-04 DIAGNOSIS — I10 Essential (primary) hypertension: Secondary | ICD-10-CM | POA: Diagnosis not present

## 2016-02-04 DIAGNOSIS — E119 Type 2 diabetes mellitus without complications: Secondary | ICD-10-CM | POA: Diagnosis not present

## 2016-02-17 ENCOUNTER — Other Ambulatory Visit (HOSPITAL_COMMUNITY): Payer: Self-pay | Admitting: Family Medicine

## 2016-02-17 ENCOUNTER — Ambulatory Visit (HOSPITAL_COMMUNITY)
Admission: RE | Admit: 2016-02-17 | Discharge: 2016-02-17 | Disposition: A | Discharge status: Home / Self Care | Payer: Medicare Other | Source: Ambulatory Visit | Attending: Family Medicine | Admitting: Family Medicine

## 2016-02-17 DIAGNOSIS — M13811 Other specified arthritis, right shoulder: Secondary | ICD-10-CM

## 2016-02-17 DIAGNOSIS — Z1231 Encounter for screening mammogram for malignant neoplasm of breast: Secondary | ICD-10-CM

## 2016-02-17 DIAGNOSIS — Z1389 Encounter for screening for other disorder: Secondary | ICD-10-CM

## 2016-02-17 DIAGNOSIS — M25511 Pain in right shoulder: Secondary | ICD-10-CM | POA: Insufficient documentation

## 2016-02-17 DIAGNOSIS — E6609 Other obesity due to excess calories: Secondary | ICD-10-CM | POA: Diagnosis not present

## 2016-02-17 DIAGNOSIS — Z6832 Body mass index (BMI) 32.0-32.9, adult: Secondary | ICD-10-CM | POA: Diagnosis not present

## 2016-03-03 DIAGNOSIS — M47812 Spondylosis without myelopathy or radiculopathy, cervical region: Secondary | ICD-10-CM | POA: Diagnosis not present

## 2016-03-08 DIAGNOSIS — M47812 Spondylosis without myelopathy or radiculopathy, cervical region: Secondary | ICD-10-CM | POA: Diagnosis not present

## 2016-03-08 DIAGNOSIS — M4802 Spinal stenosis, cervical region: Secondary | ICD-10-CM | POA: Diagnosis not present

## 2016-03-17 DIAGNOSIS — M5136 Other intervertebral disc degeneration, lumbar region: Secondary | ICD-10-CM | POA: Diagnosis not present

## 2016-03-17 DIAGNOSIS — M4180 Other forms of scoliosis, site unspecified: Secondary | ICD-10-CM | POA: Diagnosis not present

## 2016-03-17 DIAGNOSIS — M4806 Spinal stenosis, lumbar region: Secondary | ICD-10-CM | POA: Diagnosis not present

## 2016-03-31 DIAGNOSIS — E6609 Other obesity due to excess calories: Secondary | ICD-10-CM | POA: Diagnosis not present

## 2016-03-31 DIAGNOSIS — I10 Essential (primary) hypertension: Secondary | ICD-10-CM | POA: Diagnosis not present

## 2016-03-31 DIAGNOSIS — Z0000 Encounter for general adult medical examination without abnormal findings: Secondary | ICD-10-CM | POA: Diagnosis not present

## 2016-03-31 DIAGNOSIS — Z6832 Body mass index (BMI) 32.0-32.9, adult: Secondary | ICD-10-CM | POA: Diagnosis not present

## 2016-03-31 DIAGNOSIS — Z1389 Encounter for screening for other disorder: Secondary | ICD-10-CM | POA: Diagnosis not present

## 2016-04-04 DIAGNOSIS — E119 Type 2 diabetes mellitus without complications: Secondary | ICD-10-CM | POA: Diagnosis not present

## 2016-04-04 DIAGNOSIS — I10 Essential (primary) hypertension: Secondary | ICD-10-CM | POA: Diagnosis not present

## 2016-04-04 DIAGNOSIS — E782 Mixed hyperlipidemia: Secondary | ICD-10-CM | POA: Diagnosis not present

## 2016-04-04 DIAGNOSIS — E6609 Other obesity due to excess calories: Secondary | ICD-10-CM | POA: Diagnosis not present

## 2016-04-04 DIAGNOSIS — Z6832 Body mass index (BMI) 32.0-32.9, adult: Secondary | ICD-10-CM | POA: Diagnosis not present

## 2016-04-07 DIAGNOSIS — M5030 Other cervical disc degeneration, unspecified cervical region: Secondary | ICD-10-CM | POA: Diagnosis not present

## 2016-04-11 ENCOUNTER — Other Ambulatory Visit (HOSPITAL_COMMUNITY): Payer: Self-pay | Admitting: Family Medicine

## 2016-04-11 DIAGNOSIS — R1907 Generalized intra-abdominal and pelvic swelling, mass and lump: Secondary | ICD-10-CM

## 2016-04-15 ENCOUNTER — Ambulatory Visit (HOSPITAL_COMMUNITY)
Admission: RE | Admit: 2016-04-15 | Discharge: 2016-04-15 | Disposition: A | Discharge status: Home / Self Care | Payer: Medicare Other | Source: Ambulatory Visit | Attending: Family Medicine | Admitting: Family Medicine

## 2016-04-15 DIAGNOSIS — M5135 Other intervertebral disc degeneration, thoracolumbar region: Secondary | ICD-10-CM | POA: Insufficient documentation

## 2016-04-15 DIAGNOSIS — R1900 Intra-abdominal and pelvic swelling, mass and lump, unspecified site: Secondary | ICD-10-CM | POA: Diagnosis not present

## 2016-04-15 DIAGNOSIS — Z6832 Body mass index (BMI) 32.0-32.9, adult: Secondary | ICD-10-CM | POA: Diagnosis not present

## 2016-04-15 DIAGNOSIS — E6609 Other obesity due to excess calories: Secondary | ICD-10-CM | POA: Diagnosis not present

## 2016-04-15 DIAGNOSIS — E119 Type 2 diabetes mellitus without complications: Secondary | ICD-10-CM | POA: Insufficient documentation

## 2016-04-15 DIAGNOSIS — I10 Essential (primary) hypertension: Secondary | ICD-10-CM | POA: Insufficient documentation

## 2016-04-15 DIAGNOSIS — R1907 Generalized intra-abdominal and pelvic swelling, mass and lump: Secondary | ICD-10-CM

## 2016-04-15 DIAGNOSIS — K5730 Diverticulosis of large intestine without perforation or abscess without bleeding: Secondary | ICD-10-CM | POA: Insufficient documentation

## 2016-04-15 LAB — POCT I-STAT, CHEM 8
BUN: 14 mg/dL (ref 6–20)
CREATININE: 1 mg/dL (ref 0.44–1.00)
Calcium, Ion: 1.17 mmol/L (ref 1.13–1.30)
Chloride: 93 mmol/L — ABNORMAL LOW (ref 101–111)
GLUCOSE: 68 mg/dL (ref 65–99)
HCT: 43 % (ref 36.0–46.0)
HEMOGLOBIN: 14.6 g/dL (ref 12.0–15.0)
Potassium: 5.2 mmol/L — ABNORMAL HIGH (ref 3.5–5.1)
Sodium: 129 mmol/L — ABNORMAL LOW (ref 135–145)
TCO2: 28 mmol/L (ref 0–100)

## 2016-04-15 MED ORDER — IOPAMIDOL (ISOVUE-300) INJECTION 61%
100.0000 mL | Freq: Once | INTRAVENOUS | Status: AC | PRN
  Administered 2016-04-15: 100 mL via INTRAVENOUS

## 2016-05-18 DIAGNOSIS — T148 Other injury of unspecified body region: Secondary | ICD-10-CM | POA: Diagnosis not present

## 2016-05-18 DIAGNOSIS — Z6831 Body mass index (BMI) 31.0-31.9, adult: Secondary | ICD-10-CM | POA: Diagnosis not present

## 2016-05-18 DIAGNOSIS — Z1389 Encounter for screening for other disorder: Secondary | ICD-10-CM | POA: Diagnosis not present

## 2016-05-18 DIAGNOSIS — E6609 Other obesity due to excess calories: Secondary | ICD-10-CM | POA: Diagnosis not present

## 2016-05-18 DIAGNOSIS — L089 Local infection of the skin and subcutaneous tissue, unspecified: Secondary | ICD-10-CM | POA: Diagnosis not present

## 2016-10-03 DIAGNOSIS — E039 Hypothyroidism, unspecified: Secondary | ICD-10-CM | POA: Diagnosis not present

## 2016-10-03 DIAGNOSIS — E6609 Other obesity due to excess calories: Secondary | ICD-10-CM | POA: Diagnosis not present

## 2016-10-03 DIAGNOSIS — Z6830 Body mass index (BMI) 30.0-30.9, adult: Secondary | ICD-10-CM | POA: Diagnosis not present

## 2016-10-03 DIAGNOSIS — Z1389 Encounter for screening for other disorder: Secondary | ICD-10-CM | POA: Diagnosis not present

## 2016-10-03 DIAGNOSIS — I10 Essential (primary) hypertension: Secondary | ICD-10-CM | POA: Diagnosis not present

## 2016-10-03 DIAGNOSIS — Z23 Encounter for immunization: Secondary | ICD-10-CM | POA: Diagnosis not present

## 2016-10-03 DIAGNOSIS — E119 Type 2 diabetes mellitus without complications: Secondary | ICD-10-CM | POA: Diagnosis not present

## 2017-02-09 DIAGNOSIS — M5136 Other intervertebral disc degeneration, lumbar region: Secondary | ICD-10-CM | POA: Diagnosis not present

## 2017-02-09 DIAGNOSIS — M48061 Spinal stenosis, lumbar region without neurogenic claudication: Secondary | ICD-10-CM | POA: Diagnosis not present

## 2017-02-27 ENCOUNTER — Emergency Department (HOSPITAL_COMMUNITY): Payer: Medicare Other

## 2017-02-27 ENCOUNTER — Encounter (HOSPITAL_COMMUNITY): Payer: Self-pay

## 2017-02-27 ENCOUNTER — Inpatient Hospital Stay (HOSPITAL_COMMUNITY)
Admission: EM | Admit: 2017-02-27 | Discharge: 2017-03-02 | DRG: 065 | Disposition: A | Discharge status: Home / Self Care | Payer: Medicare Other | Attending: Internal Medicine | Admitting: Internal Medicine

## 2017-02-27 DIAGNOSIS — E785 Hyperlipidemia, unspecified: Secondary | ICD-10-CM | POA: Diagnosis present

## 2017-02-27 DIAGNOSIS — I161 Hypertensive emergency: Secondary | ICD-10-CM | POA: Diagnosis present

## 2017-02-27 DIAGNOSIS — Z7984 Long term (current) use of oral hypoglycemic drugs: Secondary | ICD-10-CM

## 2017-02-27 DIAGNOSIS — E1151 Type 2 diabetes mellitus with diabetic peripheral angiopathy without gangrene: Secondary | ICD-10-CM | POA: Diagnosis present

## 2017-02-27 DIAGNOSIS — E871 Hypo-osmolality and hyponatremia: Secondary | ICD-10-CM | POA: Diagnosis not present

## 2017-02-27 DIAGNOSIS — Z8249 Family history of ischemic heart disease and other diseases of the circulatory system: Secondary | ICD-10-CM

## 2017-02-27 DIAGNOSIS — R410 Disorientation, unspecified: Secondary | ICD-10-CM | POA: Diagnosis not present

## 2017-02-27 DIAGNOSIS — R0602 Shortness of breath: Secondary | ICD-10-CM

## 2017-02-27 DIAGNOSIS — M549 Dorsalgia, unspecified: Secondary | ICD-10-CM | POA: Diagnosis present

## 2017-02-27 DIAGNOSIS — G8929 Other chronic pain: Secondary | ICD-10-CM | POA: Diagnosis present

## 2017-02-27 DIAGNOSIS — R4701 Aphasia: Secondary | ICD-10-CM | POA: Diagnosis not present

## 2017-02-27 DIAGNOSIS — R42 Dizziness and giddiness: Secondary | ICD-10-CM

## 2017-02-27 DIAGNOSIS — I679 Cerebrovascular disease, unspecified: Secondary | ICD-10-CM | POA: Diagnosis present

## 2017-02-27 DIAGNOSIS — I639 Cerebral infarction, unspecified: Secondary | ICD-10-CM | POA: Diagnosis not present

## 2017-02-27 DIAGNOSIS — K219 Gastro-esophageal reflux disease without esophagitis: Secondary | ICD-10-CM | POA: Diagnosis present

## 2017-02-27 DIAGNOSIS — IMO0002 Reserved for concepts with insufficient information to code with codable children: Secondary | ICD-10-CM

## 2017-02-27 DIAGNOSIS — E039 Hypothyroidism, unspecified: Secondary | ICD-10-CM | POA: Diagnosis present

## 2017-02-27 DIAGNOSIS — Z888 Allergy status to other drugs, medicaments and biological substances status: Secondary | ICD-10-CM

## 2017-02-27 DIAGNOSIS — Z885 Allergy status to narcotic agent status: Secondary | ICD-10-CM

## 2017-02-27 DIAGNOSIS — I674 Hypertensive encephalopathy: Secondary | ICD-10-CM | POA: Diagnosis not present

## 2017-02-27 DIAGNOSIS — I10 Essential (primary) hypertension: Secondary | ICD-10-CM | POA: Diagnosis present

## 2017-02-27 DIAGNOSIS — F329 Major depressive disorder, single episode, unspecified: Secondary | ICD-10-CM | POA: Diagnosis present

## 2017-02-27 LAB — URINALYSIS, ROUTINE W REFLEX MICROSCOPIC
Bilirubin Urine: NEGATIVE
GLUCOSE, UA: NEGATIVE mg/dL
HGB URINE DIPSTICK: NEGATIVE
Ketones, ur: NEGATIVE mg/dL
Nitrite: NEGATIVE
PROTEIN: NEGATIVE mg/dL
Specific Gravity, Urine: 1.006 (ref 1.005–1.030)
pH: 7 (ref 5.0–8.0)

## 2017-02-27 LAB — I-STAT CHEM 8, ED
BUN: 14 mg/dL (ref 6–20)
CREATININE: 1 mg/dL (ref 0.44–1.00)
Calcium, Ion: 1.13 mmol/L — ABNORMAL LOW (ref 1.15–1.40)
Chloride: 97 mmol/L — ABNORMAL LOW (ref 101–111)
GLUCOSE: 88 mg/dL (ref 65–99)
HEMATOCRIT: 43 % (ref 36.0–46.0)
HEMOGLOBIN: 14.6 g/dL (ref 12.0–15.0)
Potassium: 3.9 mmol/L (ref 3.5–5.1)
Sodium: 134 mmol/L — ABNORMAL LOW (ref 135–145)
TCO2: 27 mmol/L (ref 0–100)

## 2017-02-27 LAB — I-STAT TROPONIN, ED: Troponin i, poc: 0 ng/mL (ref 0.00–0.08)

## 2017-02-27 LAB — CBC
HEMATOCRIT: 40.8 % (ref 36.0–46.0)
HEMOGLOBIN: 13.7 g/dL (ref 12.0–15.0)
MCH: 29.6 pg (ref 26.0–34.0)
MCHC: 33.6 g/dL (ref 30.0–36.0)
MCV: 88.1 fL (ref 78.0–100.0)
Platelets: 276 10*3/uL (ref 150–400)
RBC: 4.63 MIL/uL (ref 3.87–5.11)
RDW: 13.7 % (ref 11.5–15.5)
WBC: 9.9 10*3/uL (ref 4.0–10.5)

## 2017-02-27 LAB — COMPREHENSIVE METABOLIC PANEL
ALT: 15 U/L (ref 14–54)
ANION GAP: 7 (ref 5–15)
AST: 17 U/L (ref 15–41)
Albumin: 4.3 g/dL (ref 3.5–5.0)
Alkaline Phosphatase: 52 U/L (ref 38–126)
BILIRUBIN TOTAL: 0.3 mg/dL (ref 0.3–1.2)
BUN: 14 mg/dL (ref 6–20)
CO2: 28 mmol/L (ref 22–32)
Calcium: 9.3 mg/dL (ref 8.9–10.3)
Chloride: 98 mmol/L — ABNORMAL LOW (ref 101–111)
Creatinine, Ser: 0.89 mg/dL (ref 0.44–1.00)
Glucose, Bld: 91 mg/dL (ref 65–99)
POTASSIUM: 3.8 mmol/L (ref 3.5–5.1)
Sodium: 133 mmol/L — ABNORMAL LOW (ref 135–145)
TOTAL PROTEIN: 7.7 g/dL (ref 6.5–8.1)

## 2017-02-27 LAB — DIFFERENTIAL
Basophils Absolute: 0 10*3/uL (ref 0.0–0.1)
Basophils Relative: 0 %
EOS ABS: 0.2 10*3/uL (ref 0.0–0.7)
EOS PCT: 2 %
LYMPHS ABS: 3.6 10*3/uL (ref 0.7–4.0)
Lymphocytes Relative: 36 %
MONOS PCT: 8 %
Monocytes Absolute: 0.8 10*3/uL (ref 0.1–1.0)
Neutro Abs: 5.2 10*3/uL (ref 1.7–7.7)
Neutrophils Relative %: 54 %

## 2017-02-27 LAB — RAPID URINE DRUG SCREEN, HOSP PERFORMED
Amphetamines: NOT DETECTED
BARBITURATES: NOT DETECTED
Benzodiazepines: NOT DETECTED
COCAINE: NOT DETECTED
Opiates: NOT DETECTED
Tetrahydrocannabinol: NOT DETECTED

## 2017-02-27 LAB — GLUCOSE, CAPILLARY: GLUCOSE-CAPILLARY: 103 mg/dL — AB (ref 65–99)

## 2017-02-27 LAB — APTT: aPTT: 26 seconds (ref 24–36)

## 2017-02-27 LAB — PROTIME-INR
INR: 0.97
Prothrombin Time: 12.9 seconds (ref 11.4–15.2)

## 2017-02-27 LAB — TSH: TSH: 2.808 u[IU]/mL (ref 0.350–4.500)

## 2017-02-27 LAB — ETHANOL

## 2017-02-27 MED ORDER — ATENOLOL 25 MG PO TABS
100.0000 mg | ORAL_TABLET | Freq: Every day | ORAL | Status: DC
Start: 2017-02-28 — End: 2017-03-02
  Administered 2017-02-28 – 2017-03-02 (×3): 100 mg via ORAL
  Filled 2017-02-27 (×4): qty 4

## 2017-02-27 MED ORDER — SODIUM CHLORIDE 0.9% FLUSH: 3.0000 mL | INTRAVENOUS | Status: DC | PRN

## 2017-02-27 MED ORDER — SODIUM CHLORIDE 0.9 % IV SOLN: 250.0000 mL | INTRAVENOUS | Status: DC | PRN

## 2017-02-27 MED ORDER — PANTOPRAZOLE SODIUM 40 MG PO TBEC
40.0000 mg | DELAYED_RELEASE_TABLET | Freq: Every day | ORAL | Status: DC
  Administered 2017-02-28 – 2017-03-02 (×3): 40 mg via ORAL
  Filled 2017-02-27 (×3): qty 1

## 2017-02-27 MED ORDER — SODIUM CHLORIDE 0.9% FLUSH
3.0000 mL | Freq: Two times a day (BID) | INTRAVENOUS | Status: DC
  Administered 2017-02-27: 3 mL via INTRAVENOUS

## 2017-02-27 MED ORDER — LEVOTHYROXINE SODIUM 100 MCG PO TABS
100.0000 ug | ORAL_TABLET | Freq: Every day | ORAL | Status: DC
  Administered 2017-02-28 – 2017-03-02 (×3): 100 ug via ORAL
  Filled 2017-02-27 (×3): qty 1

## 2017-02-27 MED ORDER — NICARDIPINE HCL IN NACL 20-0.86 MG/200ML-% IV SOLN
3.0000 mg/h | Freq: Once | INTRAVENOUS | Status: AC
  Administered 2017-02-27: 5 mg/h via INTRAVENOUS
  Filled 2017-02-27: qty 200

## 2017-02-27 MED ORDER — ONDANSETRON HCL 4 MG/2ML IJ SOLN
4.0000 mg | Freq: Four times a day (QID) | INTRAMUSCULAR | Status: DC | PRN
  Administered 2017-02-27: 4 mg via INTRAVENOUS
  Filled 2017-02-27: qty 2

## 2017-02-27 MED ORDER — ACETAMINOPHEN 325 MG PO TABS
650.0000 mg | ORAL_TABLET | Freq: Four times a day (QID) | ORAL | Status: DC | PRN
Start: 2017-02-27 — End: 2017-03-02
  Administered 2017-02-28: 650 mg via ORAL
  Filled 2017-02-27: qty 2

## 2017-02-27 MED ORDER — LISINOPRIL 5 MG PO TABS
5.0000 mg | ORAL_TABLET | Freq: Every day | ORAL | Status: DC
  Administered 2017-02-28: 5 mg via ORAL
  Filled 2017-02-27: qty 1

## 2017-02-27 MED ORDER — INSULIN ASPART 100 UNIT/ML ~~LOC~~ SOLN
0.0000 [IU] | Freq: Three times a day (TID) | SUBCUTANEOUS | Status: DC
  Administered 2017-02-28: 1 [IU] via SUBCUTANEOUS

## 2017-02-27 MED ORDER — ACETAMINOPHEN 650 MG RE SUPP: 650.0000 mg | Freq: Four times a day (QID) | RECTAL | Status: DC | PRN

## 2017-02-27 MED ORDER — CITALOPRAM HYDROBROMIDE 20 MG PO TABS
20.0000 mg | ORAL_TABLET | ORAL | Status: DC
  Administered 2017-02-28 – 2017-03-02 (×2): 20 mg via ORAL
  Filled 2017-02-27 (×2): qty 1

## 2017-02-27 MED ORDER — ENOXAPARIN SODIUM 40 MG/0.4ML ~~LOC~~ SOLN
40.0000 mg | SUBCUTANEOUS | Status: DC
  Administered 2017-02-27 – 2017-03-02 (×3): 40 mg via SUBCUTANEOUS
  Filled 2017-02-27 (×3): qty 0.4

## 2017-02-27 MED ORDER — ONDANSETRON HCL 4 MG PO TABS
4.0000 mg | ORAL_TABLET | Freq: Four times a day (QID) | ORAL | Status: DC | PRN
Start: 2017-02-27 — End: 2017-02-28

## 2017-02-27 NOTE — ED Notes (Signed)
ED Provider at bedside. 

## 2017-02-27 NOTE — H&P (Addendum)
[facility] H&P    Patient Demographics:    Brittany Goodman, is a 71 y.o. female  MRN: [id number]  DOB - 16-Nov-1946  Admit Date - 02/27/2017  Referring MD/NP/PA: Dr. Alvino Chapel  Outpatient Primary MD for the patient is [place]  Patient coming from: home   Chief Complaint  Patient presents with  . Code Stroke      HPI:    Brittany Goodman  is a 71 y.o. female, with history of diabetes mellitus hypertension, chronic back pain, Who was brought to the hospital for confusion and difficulty finding words. Patient was seen normal around 4 PM. Family member stated that patient at 4:30 PM was confused and not making sense, while talking. She had double getting the right words. She did not have slurred speech or focal weakness of extremities. No gait disturbance. No visual problems. In the ED initially code stroke was called which was later canceled by the ED physician after he discussed with tele neurologist.  CT head showed no acute abnormality. Neurologist did not think that patient had ischemic stroke, patient had significantly elevated blood pressure 127/140, and was deemed hypertensive encephalopathy.  She denies chest pain, no shortness of breath. No nausea vomiting or diarrhea. Patient takes Excedrin Migraine 1 tablet twice a day for chronic back pain.   Review of systems:    In addition to the HPI above,  No Fever-chills, No Headache, No changes with Vision or hearing, No problems swallowing food or Liquids, No Chest pain, Cough or Shortness of Breath, No Abdominal pain, No Nausea or Vomiting, bowel movements are regular, No Blood in stool or Urine, No dysuria, No new skin rashes or bruises, No new joints pains-aches,  No recent weight gain or loss, No polyuria, polydypsia or polyphagia, No significant Mental Stressors.  A full 10 point Review of Systems was done, except as stated above,  all other Review of Systems were negative.   With Past History of the following :    Past Medical History:  Diagnosis Date  . Arthritis   . Depression   . Diabetes mellitus type II   . Diverticulosis   . GERD (gastroesophageal reflux disease)   . Hyperlipidemia   . Hypertension   . Hypothyroidism   . Sepsis ([place])    2011, Escherichia coli pyelonephritis  . Spinal stenosis       Past Surgical History:  Procedure Laterality Date  . BACK SURGERY    . CARPAL TUNNEL RELEASE    . COLONOSCOPY N/A 05/29/2013   Procedure: COLONOSCOPY;  Surgeon: Daneil Dolin, MD;  Location[facility];  Service: Endoscopy;  Laterality: N/A;  12:45-moved to 11:15 [name] notified pt  . ESOPHAGOGASTRODUODENOSCOPY (EGD) WITH ESOPHAGEAL DILATION N/A 05/29/2013   Procedure: ESOPHAGOGASTRODUODENOSCOPY (EGD) WITH ESOPHAGEAL DILATION;  Surgeon: Daneil Dolin, MD;  Location[facility];  Service: Endoscopy;  Laterality: N/A;  . ILIOTIBIAL BAND RELEASE    . TOTAL ABDOMINAL HYSTERECTOMY        Social History:      Social History  Substance Use Topics  . Smoking status: Never Smoker  . Smokeless tobacco: Never Used  . Alcohol use No       Family History :     Family History  Problem Relation Age of Onset  . Heart disease Father   . Heart attack Father 82  . Heart disease Brother   . Colon polyps Brother   . Colon cancer Neg Hx       Home Medications:   Prior to Admission medications   Medication Sig Start Date End Date Taking? Authorizing Provider  acetaminophen (TYLENOL) 325 MG tablet Take 325 mg by mouth every 6 (six) hours as needed for pain.     Historical Provider, MD  Ascorbic Acid (VITAMIN C) 1000 MG tablet Take 1,000-3,000 mg by mouth 2 (two) times daily.    Historical Provider, MD  Aspirin-Salicylamide-Caffeine (ARTHRITIS STRENGTH BC POWDER PO) Take 1-2 packets by mouth 3 (three) times daily as needed (back pain.).    Historical Provider, MD  atenolol (TENORMIN) 100 MG  tablet Take 100 mg by mouth daily.      Historical Provider, MD  benzonatate (TESSALON) 100 MG capsule Take 1 capsule (100 mg total) by mouth 3 (three) times daily as needed for cough. 01/11/14   Sherwood Gambler, MD  citalopram (CELEXA) 20 MG tablet Take 20 mg by mouth every other day.     Historical Provider, MD  cyclobenzaprine (FLEXERIL) 10 MG tablet Take 10 mg by mouth 2 (two) times daily as needed for muscle spasms.     Historical Provider, MD  Dextromethorphan-Guaifenesin ([place] FAST-MAX DM MAX) 5-100 MG/5ML LIQD Take 20 mLs by mouth daily as needed (cold).    Historical Provider, MD  glimepiride (AMARYL) 1 MG tablet Take 1 mg by mouth daily before breakfast.  04/23/13   Historical Provider, MD  levothyroxine (SYNTHROID, LEVOTHROID) 100 MCG tablet Take 100 mcg by mouth daily.      Historical Provider, MD  lisinopril (PRINIVIL,ZESTRIL) 5 MG tablet Take 5 mg by mouth daily.  03/09/13   Historical Provider, MD  omeprazole (PRILOSEC) 20 MG capsule Take 20 mg by mouth daily.     Historical Provider, MD  vitamin B-12 (CYANOCOBALAMIN) 1000 MCG tablet Take 1,000 mcg by mouth daily.    Historical Provider, MD     Allergies:     Allergies  Allergen Reactions  . Codeine   . Dilaudid [Hydromorphone Hcl]     hallucination  . Morphine     vomiting  . Tetracycline     vomiting     Physical Exam:   Vitals  Blood pressure (!) 176/92, pulse 80, temperature 98.2 F (36.8 C), temperature source Oral, resp. rate 16, height 5' 6"  (1.676 m), weight 83.9 kg (185 lb), SpO2 100 %.  1.  General: Appears in no acute distress   2. Psychiatric:  Intact judgement and  insight, awake alert, oriented x 3.  3. Neurologic: No focal neurological deficits, all cranial nerves intact.Strength 5/5 all 4 extremities, sensation intact all 4 extremities, plantars down going.Patient has conduction aphasia, with finding the right words and difficulty repeating the words.   4. Eyes :  anicteric sclerae, moist  conjunctivae with no lid lag. PERRLA.  5. ENMT:  Oropharynx clear with moist mucous membranes and good dentition  6. Neck:  supple, no cervical lymphadenopathy appriciated, No thyromegaly  7. Respiratory : Normal respiratory effort, good air movement bilaterally,clear to  auscultation bilaterally  8. Cardiovascular : RRR, no gallops, rubs or murmurs, no  leg edema  9. Gastrointestinal:  Positive bowel sounds, abdomen soft, non-tender to palpation,no hepatosplenomegaly, no rigidity or guarding       10. Skin:  No cyanosis, normal texture and turgor, no rash, lesions or ulcers  11.Musculoskeletal:  Good muscle tone,  joints appear normal , no effusions,  normal range of motion    Data Review:    CBC  Recent Labs Lab 02/27/17 2012 02/27/17 2036  WBC 9.9  --   HGB 13.7 14.6  HCT 40.8 43.0  PLT 276  --   MCV 88.1  --   MCH 29.6  --   MCHC 33.6  --   RDW 13.7  --   LYMPHSABS 3.6  --   MONOABS 0.8  --   EOSABS 0.2  --   BASOSABS 0.0  --    ------------------------------------------------------------------------------------------------------------------  Chemistries   Recent Labs Lab 02/27/17 2012 02/27/17 2036  NA 133* 134*  K 3.8 3.9  CL 98* 97*  CO2 28  --   GLUCOSE 91 88  BUN 14 14  CREATININE 0.89 1.00  CALCIUM 9.3  --   AST 17  --   ALT 15  --   ALKPHOS 52  --   BILITOT 0.3  --    ------------------------------------------------------------------------------------------------------------------  ------------------------------------------------------------------------------------------------------------------ GFR: Estimated Creatinine Clearance: 57.9 mL/min (by C-G formula based on SCr of 1 mg/dL). Liver Function Tests:  Recent Labs Lab 02/27/17 2012  AST 17  ALT 15  ALKPHOS 52  BILITOT 0.3  PROT 7.7  ALBUMIN 4.3   No results for input(s): LIPASE, AMYLASE in the last 168 hours. No results for input(s): AMMONIA in the last 168  hours. Coagulation Profile:  Recent Labs Lab 02/27/17 2012  INR 0.97    ---------------------------------------------------------------------------------------------------------------     Imaging Results:    Ct Head Code Stroke W/o Cm  Result Date: 02/27/2017 CLINICAL DATA:  Code stroke. Hypertensive, weakness, nausea and vomiting. Confusion and difficulty speaking. History of hypertension, hyperlipidemia, diabetes. EXAM: CT HEAD WITHOUT CONTRAST TECHNIQUE: Contiguous axial images were obtained from the base of the skull through the vertex without intravenous contrast. COMPARISON:  CT HEAD May 20, 2014 FINDINGS: BRAIN: No intraparenchymal hemorrhage, mass effect nor midline shift. The ventricles and sulci are normal for age. Patchy supratentorial white matter hypodensities less than expected for patient's age, though non-specific are most compatible with chronic small vessel ischemic disease. No acute large vascular territory infarcts. No abnormal extra-axial fluid collections. Basal cisterns are patent. VASCULAR: Dense intracranial vessels most compatible with hemoconcentration. Mild calcific atherosclerosis of the carotid siphons. SKULL: No skull fracture. No significant scalp soft tissue swelling. SINUSES/ORBITS: Mild RIGHT sphenoid mucosal thickening. Mastoid air cells are well aerated. The included ocular globes and orbital contents are non-suspicious. OTHER: Fatty replaced parotid glands. ASPECTS [facility] Stroke Program Early CT Score) - Ganglionic level infarction (caudate, lentiform nuclei, internal capsule, insula, M1-M3 cortex): 7 - Supraganglionic infarction (M4-M6 cortex): 3 Total score (0-10 with 10 being normal): 10 IMPRESSION: 1. Negative CT HEAD for age. Mildly dense intracranial vessels most compatible with hemoconcentration. 2. ASPECTS is 10. Critical Value/emergent results were called by telephone at the time of interpretation on 02/27/2017 at 8:31 pm to Dr. Davonna Belling , who  verbally acknowledged these results. Electronically Signed   By: Elon Alas M.D.   On: 02/27/2017 20:31    My personal review of EKG: Rhythm NSR   Assessment & Plan:    Active Problems:   HYPERTENSION, BENIGN ESSENTIAL   Hypertensive encephalopathy  1. Hypertensive encephalopathy- patient attempting with significantly elevated blood pressure with symptoms of confusion and conduction aphasia. Her symptoms are slowly improving. CT head was negative for stroke. Neurology, (tele)  was consulted by the ED physician, who did not feel that patient had ischemic stroke. He recommended to control the blood pressure with neurochecks every 4 hours. I will order MRI brain for tomorrow morning. I will obtain formal neurology consultation in a.m. 2. Hypertensive emergency- patient blood pressure improved before starting the cardene infusion, continue Cardene drip, will restart patient's home medications including atenolol 100 mg, lisinopril 5 mg. Will hold Excedrin as it contains caffeine. 3. Chronic back pain-we'll start Tylenol when necessary for pain, patient coming discharged on Vicodin when necessary as she has been taking Excedrin migraine for her chronic back pain. 4. Diabetes mellitus- hold glyburide, will start sliding scale insulin with NovoLog. 5. Hypothyroidism -continue Synthroid, will check TSH    DVT Prophylaxis-   Lovenox   AM Labs Ordered, also please review Full Orders  Family Communication: Admission, patients condition and plan of care including tests being ordered have been discussed with the patient and her son insisted bedside  who indicate understanding and agree with the plan and Code Status.  Code Status: Limited code, DO NOT INTUBATE   Admission status: Observation   Time spent in minutes : 60 minutes   Deangelo Berns S M.D on 02/27/2017 at 9:42 PM  Between 7am to 7pm - Pager - [phone]. After 7pm go to [email or web address] - password [facility]  [facility] - Office   [phone]

## 2017-02-27 NOTE — ED Notes (Signed)
Spoke to Dr. Darrick Meigs regarding Cardene and SBP. Per Dr. Darrick Meigs cardene drip may be d/c. Orders carried out.

## 2017-02-27 NOTE — Progress Notes (Signed)
Called 2005, beeper 2006, exam started 2010, exam finished and sent to soc 2015 , completed in epic 2022, rad called 2022.

## 2017-02-27 NOTE — ED Provider Notes (Signed)
[facility] DEPT Provider Note   CSN: [id number] Arrival date & time: 02/27/17  1958   An emergency department physician performed an initial assessment on this suspected stroke patient at 2009.  History   Chief Complaint Chief Complaint  Patient presents with  . Code Stroke    HPI Brittany Goodman is a 71 y.o. female.  HPI Patient brought in for confusion and difficulty speaking. Last normal somewhat around 4:00. Family member states that when she got home at 4:30 she was a little confused. Patient has had a little bit of a headache. States she has trouble getting her words and some confusion. No localizing numbness or weakness. No vision changes. History of hypertension.  Past Medical History:  Diagnosis Date  . Arthritis   . Depression   . Diabetes mellitus type II   . Diverticulosis   . GERD (gastroesophageal reflux disease)   . Hyperlipidemia   . Hypertension   . Hypothyroidism   . Sepsis ([place])    2011, Escherichia coli pyelonephritis  . Spinal stenosis     Patient Active Problem List   Diagnosis Date Noted  . RUQ pain 05/06/2013  . Abdominal pain, epigastric 05/06/2013  . Esophageal dysphagia 05/06/2013  . GERD (gastroesophageal reflux disease) 05/06/2013  . Family history of polyps in the colon 05/06/2013  . DYSPNEA 09/01/2009  . MIXED HYPERLIPIDEMIA 08/05/2009  . HYPERTENSION, BENIGN ESSENTIAL 08/05/2009  . PRECORDIAL PAIN 08/05/2009    Past Surgical History:  Procedure Laterality Date  . BACK SURGERY    . CARPAL TUNNEL RELEASE    . COLONOSCOPY N/A 05/29/2013   Procedure: COLONOSCOPY;  Surgeon: Daneil Dolin, MD;  Location: [facility];  Service: Endoscopy;  Laterality: N/A;  12:45-moved to 11:15 [name] notified pt  . ESOPHAGOGASTRODUODENOSCOPY (EGD) WITH ESOPHAGEAL DILATION N/A 05/29/2013   Procedure: ESOPHAGOGASTRODUODENOSCOPY (EGD) WITH ESOPHAGEAL DILATION;  Surgeon: Daneil Dolin, MD;  Location: [facility];  Service: Endoscopy;  Laterality:  N/A;  . ILIOTIBIAL BAND RELEASE    . TOTAL ABDOMINAL HYSTERECTOMY      OB History    No data available       Home Medications    Prior to Admission medications   Medication Sig Start Date End Date Taking? Authorizing Provider  acetaminophen (TYLENOL) 325 MG tablet Take 325 mg by mouth every 6 (six) hours as needed for pain.     Historical Provider, MD  Ascorbic Acid (VITAMIN C) 1000 MG tablet Take 1,000-3,000 mg by mouth 2 (two) times daily.    Historical Provider, MD  Aspirin-Salicylamide-Caffeine (ARTHRITIS STRENGTH BC POWDER PO) Take 1-2 packets by mouth 3 (three) times daily as needed (back pain.).    Historical Provider, MD  atenolol (TENORMIN) 100 MG tablet Take 100 mg by mouth daily.      Historical Provider, MD  benzonatate (TESSALON) 100 MG capsule Take 1 capsule (100 mg total) by mouth 3 (three) times daily as needed for cough. 01/11/14   Sherwood Gambler, MD  citalopram (CELEXA) 20 MG tablet Take 20 mg by mouth every other day.     Historical Provider, MD  cyclobenzaprine (FLEXERIL) 10 MG tablet Take 10 mg by mouth 2 (two) times daily as needed for muscle spasms.     Historical Provider, MD  Dextromethorphan-Guaifenesin ([place] FAST-MAX DM MAX) 5-100 MG/5ML LIQD Take 20 mLs by mouth daily as needed (cold).    Historical Provider, MD  glimepiride (AMARYL) 1 MG tablet Take 1 mg by mouth daily before breakfast.  04/23/13  Historical Provider, MD  levothyroxine (SYNTHROID, LEVOTHROID) 100 MCG tablet Take 100 mcg by mouth daily.      Historical Provider, MD  lisinopril (PRINIVIL,ZESTRIL) 5 MG tablet Take 5 mg by mouth daily.  03/09/13   Historical Provider, MD  omeprazole (PRILOSEC) 20 MG capsule Take 20 mg by mouth daily.     Historical Provider, MD  vitamin B-12 (CYANOCOBALAMIN) 1000 MCG tablet Take 1,000 mcg by mouth daily.    Historical Provider, MD    Family History Family History  Problem Relation Age of Onset  . Heart disease Father   . Heart attack Father 62  . Heart  disease Brother   . Colon polyps Brother   . Colon cancer Neg Hx     Social History Social History  Substance Use Topics  . Smoking status: Never Smoker  . Smokeless tobacco: Never Used  . Alcohol use No     Allergies   Codeine; Dilaudid [hydromorphone hcl]; Morphine; and Tetracycline   Review of Systems Review of Systems  Constitutional: Negative for appetite change.  HENT: Negative for congestion.   Respiratory: Negative for shortness of breath.   Cardiovascular: Negative for chest pain.  Gastrointestinal: Negative for abdominal distention.  Genitourinary: Negative for dyspareunia.  Musculoskeletal: Negative for back pain.  Skin: Negative for rash.  Neurological: Positive for speech difficulty and headaches.  Psychiatric/Behavioral: Positive for confusion.     Physical Exam Updated Vital Signs BP (!) 192/103 (BP Location: Left Arm)   Pulse 76   Temp 98.2 F (36.8 C) (Oral)   Resp 18   Ht 5' 6"  (1.676 m)   Wt 185 lb (83.9 kg)   SpO2 98%   BMI 29.86 kg/m   Physical Exam  Constitutional: She is oriented to person, place, and time. She appears well-developed.  HENT:  Head: Atraumatic.  Eyes: EOM are normal. Pupils are equal, round, and reactive to light.  Neck: Neck supple.  Cardiovascular: Normal rate.   Pulmonary/Chest: Effort normal.  Abdominal: Soft. There is no tenderness.  Neurological: She is alert and oriented to person, place, and time.  Patient is awake and appropriate. Aplastic questions but mild slowing. Able to identify self and president. Moves all extremities equally. Good grips bilaterally. Eye movements intact. Face symmetric.  Skin: Skin is warm. Capillary refill takes less than 2 seconds.  Psychiatric: She has a normal mood and affect.     ED Treatments / Results  Labs (all labs ordered are listed, but only abnormal results are displayed) Labs Reviewed  COMPREHENSIVE METABOLIC PANEL - Abnormal; Notable for the following:       Result  Value   Sodium 133 (*)    Chloride 98 (*)    All other components within normal limits  I-STAT CHEM 8, ED - Abnormal; Notable for the following:    Sodium 134 (*)    Chloride 97 (*)    Calcium, Ion 1.13 (*)    All other components within normal limits  ETHANOL  PROTIME-INR  APTT  CBC  DIFFERENTIAL  RAPID URINE DRUG SCREEN, HOSP PERFORMED  URINALYSIS, ROUTINE W REFLEX MICROSCOPIC  I-STAT TROPOININ, ED    EKG  EKG Interpretation  Date/Time:  Monday February 27 2017 20:09:09 EDT Ventricular Rate:  78 PR Interval:    QRS Duration: 97 QT Interval:  389 QTC Calculation: 444 R Axis:   -5 Text Interpretation:  Sinus rhythm Low voltage, precordial leads Confirmed by Alvino Chapel  MD, Marchele Decock [phone]) on 02/27/2017 9:16:42 PM  Radiology Ct Head Code Stroke W/o Cm  Result Date: 02/27/2017 CLINICAL DATA:  Code stroke. Hypertensive, weakness, nausea and vomiting. Confusion and difficulty speaking. History of hypertension, hyperlipidemia, diabetes. EXAM: CT HEAD WITHOUT CONTRAST TECHNIQUE: Contiguous axial images were obtained from the base of the skull through the vertex without intravenous contrast. COMPARISON:  CT HEAD May 20, 2014 FINDINGS: BRAIN: No intraparenchymal hemorrhage, mass effect nor midline shift. The ventricles and sulci are normal for age. Patchy supratentorial white matter hypodensities less than expected for patient's age, though non-specific are most compatible with chronic small vessel ischemic disease. No acute large vascular territory infarcts. No abnormal extra-axial fluid collections. Basal cisterns are patent. VASCULAR: Dense intracranial vessels most compatible with hemoconcentration. Mild calcific atherosclerosis of the carotid siphons. SKULL: No skull fracture. No significant scalp soft tissue swelling. SINUSES/ORBITS: Mild RIGHT sphenoid mucosal thickening. Mastoid air cells are well aerated. The included ocular globes and orbital contents are non-suspicious. OTHER:  Fatty replaced parotid glands. ASPECTS [facility] Stroke Program Early CT Score) - Ganglionic level infarction (caudate, lentiform nuclei, internal capsule, insula, M1-M3 cortex): 7 - Supraganglionic infarction (M4-M6 cortex): 3 Total score (0-10 with 10 being normal): 10 IMPRESSION: 1. Negative CT HEAD for age. Mildly dense intracranial vessels most compatible with hemoconcentration. 2. ASPECTS is 10. Critical Value/emergent results were called by telephone at the time of interpretation on 02/27/2017 at 8:31 pm to Dr. Davonna Belling , who verbally acknowledged these results. Electronically Signed   By: Elon Alas M.D.   On: 02/27/2017 20:31    Procedures Procedures (including critical care time)  Medications Ordered in ED Medications  nicardipine (CARDENE) 80m in 0.86% saline 2054mIV infusion (0.1 mg/ml) (7.5 mg/hr Intravenous Rate/Dose Change 02/27/17 2116)     Initial Impression / Assessment and Plan / ED Course  I have reviewed the triage vital signs and the nursing notes.  Pertinent labs & imaging results that were available during my care of the patient were reviewed by me and considered in my medical decision making (see chart for details).     Patient presents with some confusion. Found to be severely hypertensive. Code stroke was called after my initial evaluation. There was some question of the time of onset since it reportedly started while she was in the car by herself. Seen by myself and specialist on-call neurologist. Not a TPA candidate due to likely hypertensive cause and not a large ischemic infarct. Will start a to pain drip and admit to hospitalist.  CRITICAL CARE Performed by: PIMackie Paiotal critical care time: 30 minutes Critical care time was exclusive of separately billable procedures and treating other patients. Critical care was necessary to treat or prevent imminent or life-threatening deterioration. Critical care was time spent personally by me on  the following activities: development of treatment plan with patient and/or surrogate as well as nursing, discussions with consultants, evaluation of patient's response to treatment, examination of patient, obtaining history from patient or surrogate, ordering and performing treatments and interventions, ordering and review of laboratory studies, ordering and review of radiographic studies, pulse oximetry and re-evaluation of patient's condition.   Final Clinical Impressions(s) / ED Diagnoses   Final diagnoses:  Hypertensive encephalopathy    New Prescriptions New Prescriptions   No medications on file     NaDavonna BellingMD 02/27/17 2118

## 2017-02-27 NOTE — ED Notes (Signed)
[facility] paged @ 2007 [facility] notified @ 2009

## 2017-02-27 NOTE — ED Notes (Signed)
Returned from CT.

## 2017-02-27 NOTE — ED Notes (Signed)
Nurse unavailable for report.

## 2017-02-27 NOTE — ED Notes (Signed)
Dr. Lama at bedside. 

## 2017-02-27 NOTE — ED Notes (Addendum)
Neuro consult in progress with Dr. Hassell Done.

## 2017-02-27 NOTE — ED Notes (Signed)
To Ct , Probation officer with patient.

## 2017-02-27 NOTE — ED Notes (Signed)
Code stroke canceled per MD.

## 2017-02-27 NOTE — ED Notes (Signed)
EDP at bedside  

## 2017-02-27 NOTE — ED Triage Notes (Signed)
Patient brought in ED by family for confused noted at 27 by family. Patient states she started to get confused at1600. Patient states she cant get her words out right. No physical deficits noted.

## 2017-02-28 ENCOUNTER — Observation Stay (HOSPITAL_COMMUNITY): Payer: Medicare Other

## 2017-02-28 ENCOUNTER — Observation Stay (HOSPITAL_BASED_OUTPATIENT_CLINIC_OR_DEPARTMENT_OTHER): Payer: Medicare Other

## 2017-02-28 DIAGNOSIS — I639 Cerebral infarction, unspecified: Secondary | ICD-10-CM | POA: Diagnosis not present

## 2017-02-28 DIAGNOSIS — Z7984 Long term (current) use of oral hypoglycemic drugs: Secondary | ICD-10-CM | POA: Diagnosis not present

## 2017-02-28 DIAGNOSIS — IMO0002 Reserved for concepts with insufficient information to code with codable children: Secondary | ICD-10-CM

## 2017-02-28 DIAGNOSIS — Z885 Allergy status to narcotic agent status: Secondary | ICD-10-CM | POA: Diagnosis not present

## 2017-02-28 DIAGNOSIS — I480 Paroxysmal atrial fibrillation: Secondary | ICD-10-CM | POA: Diagnosis not present

## 2017-02-28 DIAGNOSIS — R531 Weakness: Secondary | ICD-10-CM | POA: Diagnosis not present

## 2017-02-28 DIAGNOSIS — E871 Hypo-osmolality and hyponatremia: Secondary | ICD-10-CM | POA: Diagnosis present

## 2017-02-28 DIAGNOSIS — F329 Major depressive disorder, single episode, unspecified: Secondary | ICD-10-CM | POA: Diagnosis present

## 2017-02-28 DIAGNOSIS — I4891 Unspecified atrial fibrillation: Secondary | ICD-10-CM | POA: Diagnosis not present

## 2017-02-28 DIAGNOSIS — R4182 Altered mental status, unspecified: Secondary | ICD-10-CM | POA: Diagnosis not present

## 2017-02-28 DIAGNOSIS — I674 Hypertensive encephalopathy: Secondary | ICD-10-CM | POA: Diagnosis present

## 2017-02-28 DIAGNOSIS — I509 Heart failure, unspecified: Secondary | ICD-10-CM

## 2017-02-28 DIAGNOSIS — M549 Dorsalgia, unspecified: Secondary | ICD-10-CM | POA: Diagnosis present

## 2017-02-28 DIAGNOSIS — Z8249 Family history of ischemic heart disease and other diseases of the circulatory system: Secondary | ICD-10-CM | POA: Diagnosis not present

## 2017-02-28 DIAGNOSIS — K219 Gastro-esophageal reflux disease without esophagitis: Secondary | ICD-10-CM | POA: Diagnosis present

## 2017-02-28 DIAGNOSIS — R4701 Aphasia: Secondary | ICD-10-CM | POA: Diagnosis not present

## 2017-02-28 DIAGNOSIS — I6523 Occlusion and stenosis of bilateral carotid arteries: Secondary | ICD-10-CM | POA: Diagnosis not present

## 2017-02-28 DIAGNOSIS — E785 Hyperlipidemia, unspecified: Secondary | ICD-10-CM | POA: Diagnosis present

## 2017-02-28 DIAGNOSIS — I161 Hypertensive emergency: Secondary | ICD-10-CM | POA: Diagnosis present

## 2017-02-28 DIAGNOSIS — E039 Hypothyroidism, unspecified: Secondary | ICD-10-CM | POA: Diagnosis present

## 2017-02-28 DIAGNOSIS — G8929 Other chronic pain: Secondary | ICD-10-CM | POA: Diagnosis present

## 2017-02-28 DIAGNOSIS — Z888 Allergy status to other drugs, medicaments and biological substances status: Secondary | ICD-10-CM | POA: Diagnosis not present

## 2017-02-28 DIAGNOSIS — E1151 Type 2 diabetes mellitus with diabetic peripheral angiopathy without gangrene: Secondary | ICD-10-CM | POA: Diagnosis present

## 2017-02-28 DIAGNOSIS — I10 Essential (primary) hypertension: Secondary | ICD-10-CM | POA: Diagnosis not present

## 2017-02-28 DIAGNOSIS — R410 Disorientation, unspecified: Secondary | ICD-10-CM | POA: Diagnosis not present

## 2017-02-28 DIAGNOSIS — R0602 Shortness of breath: Secondary | ICD-10-CM | POA: Diagnosis not present

## 2017-02-28 LAB — CBC
HEMATOCRIT: 41 % (ref 36.0–46.0)
HEMOGLOBIN: 13.3 g/dL (ref 12.0–15.0)
MCH: 28.6 pg (ref 26.0–34.0)
MCHC: 32.4 g/dL (ref 30.0–36.0)
MCV: 88.2 fL (ref 78.0–100.0)
Platelets: 284 10*3/uL (ref 150–400)
RBC: 4.65 MIL/uL (ref 3.87–5.11)
RDW: 13.7 % (ref 11.5–15.5)
WBC: 11.2 10*3/uL — ABNORMAL HIGH (ref 4.0–10.5)

## 2017-02-28 LAB — COMPREHENSIVE METABOLIC PANEL
ALK PHOS: 52 U/L (ref 38–126)
ALT: 14 U/L (ref 14–54)
ANION GAP: 10 (ref 5–15)
AST: 18 U/L (ref 15–41)
Albumin: 4 g/dL (ref 3.5–5.0)
BUN: 12 mg/dL (ref 6–20)
CALCIUM: 9 mg/dL (ref 8.9–10.3)
CO2: 27 mmol/L (ref 22–32)
Chloride: 93 mmol/L — ABNORMAL LOW (ref 101–111)
Creatinine, Ser: 0.73 mg/dL (ref 0.44–1.00)
GFR calc Af Amer: >60 mL/min (ref >60)
Glucose, Bld: 132 mg/dL — ABNORMAL HIGH (ref 65–99)
POTASSIUM: 4.1 mmol/L (ref 3.5–5.1)
Sodium: 130 mmol/L — ABNORMAL LOW (ref 135–145)
TOTAL PROTEIN: 7.2 g/dL (ref 6.5–8.1)
Total Bilirubin: 0.4 mg/dL (ref 0.3–1.2)

## 2017-02-28 LAB — ECHOCARDIOGRAM COMPLETE
Height: 64 in
WEIGHTICAEL: 2899.49 [oz_av]

## 2017-02-28 LAB — LIPID PANEL
CHOL/HDL RATIO: 5.2 ratio
CHOLESTEROL: 254 mg/dL — AB (ref 0–200)
HDL: 49 mg/dL (ref >40)
LDL Cholesterol: 136 mg/dL — ABNORMAL HIGH (ref 0–99)
TRIGLYCERIDES: 344 mg/dL — AB (ref <150)
VLDL: 69 mg/dL — ABNORMAL HIGH (ref 0–40)

## 2017-02-28 LAB — GLUCOSE, CAPILLARY
GLUCOSE-CAPILLARY: 133 mg/dL — AB (ref 65–99)
GLUCOSE-CAPILLARY: 100 mg/dL — AB (ref 65–99)
Glucose-Capillary: 98 mg/dL (ref 65–99)
Glucose-Capillary: 90 mg/dL (ref 65–99)

## 2017-02-28 LAB — MRSA PCR SCREENING: MRSA BY PCR: NEGATIVE

## 2017-02-28 MED ORDER — GLIMEPIRIDE 2 MG PO TABS
1.0000 mg | ORAL_TABLET | Freq: Every day | ORAL | Status: DC
  Administered 2017-02-28 – 2017-03-02 (×3): 1 mg via ORAL
  Filled 2017-02-28 (×3): qty 1

## 2017-02-28 MED ORDER — HYDRALAZINE HCL 20 MG/ML IJ SOLN: 10.0000 mg | Freq: Four times a day (QID) | INTRAMUSCULAR | Status: DC | PRN

## 2017-02-28 MED ORDER — HYDROCODONE-ACETAMINOPHEN 5-325 MG PO TABS
1.0000 | ORAL_TABLET | Freq: Four times a day (QID) | ORAL | Status: DC | PRN
  Administered 2017-02-28 – 2017-03-01 (×5): 1 via ORAL
  Filled 2017-02-28 (×5): qty 1

## 2017-02-28 MED ORDER — STROKE: EARLY STAGES OF RECOVERY BOOK
Freq: Once | Status: AC
  Administered 2017-02-28: 1
  Filled 2017-02-28: qty 1

## 2017-02-28 MED ORDER — INSULIN ASPART 100 UNIT/ML ~~LOC~~ SOLN: 0.0000 [IU] | Freq: Every day | SUBCUTANEOUS | Status: DC

## 2017-02-28 MED ORDER — ASPIRIN 81 MG PO CHEW
324.0000 mg | CHEWABLE_TABLET | Freq: Every day | ORAL | Status: DC
  Administered 2017-02-28 – 2017-03-02 (×3): 324 mg via ORAL
  Filled 2017-02-28 (×3): qty 4

## 2017-02-28 MED ORDER — HYDRALAZINE HCL 25 MG PO TABS
25.0000 mg | ORAL_TABLET | Freq: Four times a day (QID) | ORAL | Status: DC | PRN
  Administered 2017-02-28 (×2): 25 mg via ORAL
  Filled 2017-02-28 (×2): qty 1

## 2017-02-28 MED ORDER — CYCLOBENZAPRINE HCL 10 MG PO TABS
10.0000 mg | ORAL_TABLET | Freq: Three times a day (TID) | ORAL | Status: DC | PRN
  Administered 2017-02-28 – 2017-03-02 (×4): 10 mg via ORAL
  Filled 2017-02-28 (×4): qty 1

## 2017-02-28 MED ORDER — LORAZEPAM 2 MG/ML IJ SOLN
1.0000 mg | Freq: Once | INTRAMUSCULAR | Status: DC | PRN
  Filled 2017-02-28: qty 1

## 2017-02-28 MED ORDER — ATORVASTATIN CALCIUM 40 MG PO TABS
40.0000 mg | ORAL_TABLET | Freq: Every day | ORAL | Status: DC
  Filled 2017-02-28: qty 1

## 2017-02-28 NOTE — Plan of Care (Signed)
Problem: Nutrition: Goal: Risk of aspiration will decrease Outcome: Completed/Met Date Met: 02/28/17 Pt passed bedside swallow screen. Able to eat without any signs of aspiration   

## 2017-02-28 NOTE — Progress Notes (Addendum)
SLP Cancellation Note  Patient Details Name: Brittany Goodman MRN: [id number] DOB: 1946/11/12   Cancelled treatment:       Reason Eval/Treat Not Completed: SLP screened, no needs identified, will sign off; Pt passed RN swallow screen and is consuming foods, liquids, and medications without incident. SLP screened Pt in room. Pt denies any changes in swallowing, speech, language, or cognition.  SLE will be deferred at this time. Reconsult if indicated. SLP will sign off.  Thank you,  Genene Churn, [place]    [place] 02/28/2017, 8:27 PM

## 2017-02-28 NOTE — Evaluation (Signed)
Occupational Therapy Evaluation Patient Details Name: Brittany Goodman MRN: [id number] DOB: [birth year] Today's Date: 02/28/2017    History of Present Illness Brittany Goodman  is a 71 y.o. female, with history of diabetes mellitus hypertension, chronic back pain, Who was brought to the hospital for confusion and difficulty finding words. Patient was seen normal around 4 PM. Family member stated that patient at 4:30 PM was confused and not making sense, while talking. She had double getting the right words. She did not have slurred speech or focal weakness of extremities. No gait disturbance. No visual problems.   Clinical Impression   Pt in bed upon therapy arrival. Family visiting and patient agreed to participate in OT evaluation. Patient is presenting with no deficits in UB strength and coordination. She reports that she has been up and moving since she's been in the hospital with no change in ambulation or transfer performance. At this time, a patient does not require any additional OT services.     Follow Up Recommendations  No OT follow up    Equipment Recommendations  None recommended by OT       Precautions / Restrictions Precautions Precautions: None Restrictions Weight Bearing Restrictions: No             ADL either performed or assessed with clinical judgement   ADL Overall ADL's : Independent;At baseline                                             Vision Baseline Vision/History: Wears glasses Wears Glasses: At all times Patient Visual Report: No change from baseline              Pertinent Vitals/Pain Pain Assessment: 0-10 Pain Score: 6  Pain Location: Left leg and low back  Pain Descriptors / Indicators: Cramping Pain Intervention(s): Monitored during session     Hand Dominance Right   Extremity/Trunk Assessment Upper Extremity Assessment Upper Extremity Assessment: Overall WFL for tasks assessed   Lower Extremity Assessment Lower  Extremity Assessment: Defer to PT evaluation       Communication Communication Communication: No difficulties   Cognition Arousal/Alertness: Awake/alert Behavior During Therapy: WFL for tasks assessed/performed Overall Cognitive Status: Within Functional Limits for tasks assessed                                                Home Living Family/patient expects to be discharged to:: Private residence Living Arrangements: Children Available Help at Discharge: Family;Available 24 hours/day Type of Home: House Home Access: Level entry     Home Layout: Two level Alternate Level Stairs-Number of Steps: Bedroom is downstairs. 13 steps. Left side railing going down.    Bathroom Shower/Tub: Occupational psychologist: Standard     Home Equipment: Shower seat;Cane - single point;Walker - standard          Prior Functioning/Environment Level of Independence: Independent                                Barriers to D/C:  None             End of Session    Activity Tolerance: Patient tolerated treatment well  Patient left: in bed;with call bell/phone within reach;with bed alarm set;with family/visitor present  OT Visit Diagnosis: Muscle weakness (generalized) (M62.81)                Time: 1445-1500 OT Time Calculation (min): 15 min Charges:  OT General Charges $OT Visit: 1 Procedure OT Evaluation $OT Eval Low Complexity: 1 Procedure G-Codes:     Ailene Ravel, OTR/L,CBIS  [phone]   [place], Clarene Duke 02/28/2017, 3:34 PM

## 2017-02-28 NOTE — Care Management Note (Signed)
Case Management Note  Patient Details  Name: Brittany Goodman MRN: [id number] Date of Birth: 02-15-1946  Subjective/Objective:                  Adm with Hypertensive encephalopathy. From home with son, son at bedside. Patient ind with ADL's, has cane if needed. Has PCP, still drives to appointments, has prescription drug coverage.   Action/Plan: Anticipate DC home with self care.    Expected Discharge Date:       03/01/2017           Expected Discharge Plan:  Home/Self Care  In-House Referral:     Discharge planning Services  CM Consult  Post Acute Care Choice:    Choice offered to:  NA  DME Arranged:    DME Agency:     HH Arranged:    HH Agency:     Status of Service:  In process, will continue to follow  If discussed at Long Length of Stay Meetings, dates discussed:    Additional Comments:  Poppi Scantling, Chauncey Reading, RN 02/28/2017, 2:36 PM

## 2017-02-28 NOTE — Progress Notes (Signed)
PROGRESS NOTE                                                                                                                                                                                                             Patient Demographics:    Brittany Goodman, is a 71 y.o. female, DOB - Apr 24, 1946, NZV:[id number]  Admit date - 02/27/2017   Admitting Physician Oswald Hillock, MD  Outpatient Primary MD for the patient is [place]  LOS - 0  Chief Complaint  Patient presents with  . Code Stroke       Brief Narrative   Brittany Goodman  is a 71 y.o. female, with history of diabetes mellitus hypertension, chronic back pain, Who was brought to the hospital for confusion and difficulty finding words, She was admitted with the running diagnosis of hypertensive crisis however further workup showed small ischemic stroke on MRI.   Subjective:    [name] today has, No headache, No chest pain, No abdominal pain - No Nausea, No new weakness tingling or numbness, No Cough - SOB.     Assessment  & Plan :    Active Problems:   HYPERTENSION, BENIGN ESSENTIAL   Hypertensive encephalopathy   1.Expressive aphasia. Much improved due to small left parietal acute infarct on MRI. Currently she is doing much better, placed on aspirin and statin, LDL over 70 A1c pending. Echocardiogram, carotid duplex, PT, OT, speech and neurology have been consulted. No focal deficits on exam.  2. Hypertension. Hold home dose ACE inhibitor, home dose beta blocker will be continued, allow for permissive hypertension.  3. Dyslipidemia. Placed on statin.  4. DM type II. On sliding scale along with home dose Amaryl. Monitor CBGs and A1c.  CBG (last 3)   Recent Labs  02/27/17 2334 02/28/17 0734 02/28/17 1106  GLUCAP 103* 133* 98    Lab Results  Component Value Date   HGBA1C (H) 05/27/2010    7.6 (NOTE)  According to the ADA Clinical Practice Recommendations for 2011, when HbA1c is used as a screening test:   >=6.5%   Diagnostic of Diabetes Mellitus           (if abnormal result  is confirmed)  5.7-6.4%   Increased risk of developing Diabetes Mellitus  References:Diagnosis and Classification of Diabetes Mellitus,Diabetes SHFW,2637,85(YIFOY 1):S62-S69 and Standards of Medical Care in         Diabetes - 2011,Diabetes DXAJ,2878,67  (Suppl 1):S11-S61.     Diet : Diet 2 gram sodium Room service appropriate? Yes; Fluid consistency: Thin    Family Communication  :  son  Code Status :  Full  Disposition Plan  :  Home 1-2 days  Consults  :  Neurp  Procedures  :    DVT Prophylaxis  :  Lovenox    Lab Results  Component Value Date   PLT 284 02/28/2017    Inpatient Medications  Scheduled Meds: . aspirin  324 mg Oral Daily  . atenolol  100 mg Oral Daily  . atorvastatin  40 mg Oral q1800  . citalopram  20 mg Oral QODAY  . enoxaparin (LOVENOX) injection  40 mg Subcutaneous Q24H  . insulin aspart  0-9 Units Subcutaneous TID WC  . levothyroxine  100 mcg Oral QAC breakfast  . pantoprazole  40 mg Oral Daily   Continuous Infusions: PRN Meds:.acetaminophen **OR** [DISCONTINUED] acetaminophen, cyclobenzaprine, hydrALAZINE, HYDROcodone-acetaminophen, LORazepam, [DISCONTINUED] ondansetron **OR** ondansetron (ZOFRAN) IV  Antibiotics  :    Anti-infectives    None         Objective:   Vitals:   02/28/17 0935 02/28/17 0940 02/28/17 1000 02/28/17 1107  BP:   132/63 140/82  Pulse: 71 77 72 64  Resp: 14 18 16 14   Temp:    97.8 F (36.6 C)  TempSrc:    Oral  SpO2: 91% 95% 94% 94%  Weight:      Height:        Wt Readings from Last 3 Encounters:  02/28/17 82.2 kg (181 lb 3.5 oz)  06/11/13 88.5 kg (195 lb)  05/06/13 86 kg (189 lb 9.6 oz)     Intake/Output Summary (Last 24 hours) at 02/28/17 1208 Last data filed at 02/28/17 0844  Gross per  24 hour  Intake           255.42 ml  Output             1550 ml  Net         -1294.58 ml     Physical Exam  Awake Alert, Oriented X 3, No new F.N deficits, Normal affect [place].AT,PERRAL Supple Neck,No JVD, No cervical lymphadenopathy appriciated.  Symmetrical Chest wall movement, Good air movement bilaterally, CTAB RRR,No Gallops,Rubs or new Murmurs, No Parasternal Heave +ve B.Sounds, Abd Soft, No tenderness, No organomegaly appriciated, No rebound - guarding or rigidity. No Cyanosis, Clubbing or edema, No new Rash or bruise       Data Review:    CBC  Recent Labs Lab 02/27/17 2012 02/27/17 2036 02/28/17 0411  WBC 9.9  --  11.2*  HGB 13.7 14.6 13.3  HCT 40.8 43.0 41.0  PLT 276  --  284  MCV 88.1  --  88.2  MCH 29.6  --  28.6  MCHC 33.6  --  32.4  RDW 13.7  --  13.7  LYMPHSABS 3.6  --   --   MONOABS 0.8  --   --   EOSABS 0.2  --   --  BASOSABS 0.0  --   --     Chemistries   Recent Labs Lab 02/27/17 2012 02/27/17 2036 02/28/17 0411  NA 133* 134* 130*  K 3.8 3.9 4.1  CL 98* 97* 93*  CO2 28  --  27  GLUCOSE 91 88 132*  BUN 14 14 12   CREATININE 0.89 1.00 0.73  CALCIUM 9.3  --  9.0  AST 17  --  18  ALT 15  --  14  ALKPHOS 52  --  52  BILITOT 0.3  --  0.4   ------------------------------------------------------------------------------------------------------------------  Recent Labs  02/28/17 0411  CHOL 254*  HDL 49  LDLCALC 136*  TRIG 344*  CHOLHDL 5.2    Lab Results  Component Value Date   HGBA1C (H) 05/27/2010    7.6 (NOTE)                                                                       According to the ADA Clinical Practice Recommendations for 2011, when HbA1c is used as a screening test:   >=6.5%   Diagnostic of Diabetes Mellitus           (if abnormal result  is confirmed)  5.7-6.4%   Increased risk of developing Diabetes Mellitus  References:Diagnosis and Classification of Diabetes Mellitus,Diabetes [id number],6761,95(KDTOI 1):S62-S69 and  Standards of Medical Care in         Diabetes - 2011,Diabetes ZTIW,5809,98  (Suppl 1):S11-S61.   ------------------------------------------------------------------------------------------------------------------  Recent Labs  02/27/17 2012  TSH 2.808   ------------------------------------------------------------------------------------------------------------------ No results for input(s): VITAMINB12, FOLATE, FERRITIN, TIBC, IRON, RETICCTPCT in the last 72 hours.  Coagulation profile  Recent Labs Lab 02/27/17 2012  INR 0.97    No results for input(s): DDIMER in the last 72 hours.  Cardiac Enzymes No results for input(s): CKMB, TROPONINI, MYOGLOBIN in the last 168 hours.  Invalid input(s): CK ------------------------------------------------------------------------------------------------------------------ No results found for: BNP  Micro Results Recent Results (from the past 240 hour(s))  MRSA PCR Screening     Status: None   Collection Time: 02/27/17  2:00 AM  Result Value Ref Range Status   MRSA by PCR NEGATIVE NEGATIVE Final    Comment:        The GeneXpert MRSA Assay (FDA approved for NASAL specimens only), is one component of a comprehensive MRSA colonization surveillance program. It is not intended to diagnose MRSA infection nor to guide or monitor treatment for MRSA infections.     Radiology Reports Mr Virgel Paling PJ Contrast  Result Date: 02/28/2017 CLINICAL DATA:  Weakness, confusion, and speech difficulty for 2 days. EXAM: MRI HEAD WITHOUT CONTRAST MRA HEAD WITHOUT CONTRAST TECHNIQUE: Multiplanar, multiecho pulse sequences of the brain and surrounding structures were obtained without intravenous contrast. Angiographic images of the head were obtained using MRA technique without contrast. COMPARISON:  CT head 02/27/2017. FINDINGS: MRI HEAD FINDINGS Brain: Subcentimeter focus of restricted diffusion in the LEFT parietal cortex and subcortical white matter  consistent with acute infarction. No hemorrhage, mass lesion, hydrocephalus, or extra-axial fluid. Mild cerebral and cerebellar atrophy. Mild subcortical and periventricular T2 and FLAIR hyperintensities, likely chronic microvascular ischemic change. Vascular: Flow voids are maintained throughout the carotid, basilar, and vertebral arteries. There are no areas of chronic hemorrhage. Skull and  upper cervical spine: Unremarkable visualized calvarium, skullbase, and cervical vertebrae. Partial empty sella. Pineal, cerebellar tonsils unremarkable. No upper cervical cord lesions. Sinuses/Orbits: No orbital masses or proptosis. Globes appear symmetric. Sinuses appear well aerated, without evidence for air-fluid level. Other: None. MRA HEAD FINDINGS The internal carotid arteries are widely patent. The basilar artery is widely patent with RIGHT vertebral dominant. There is no proximal intracranial stenosis or aneurysm. Dolichoectatic vasculature results in artifactual loss of signal in the proximal P1 LEFT PCA segment. It is difficult to exclude intracranial atherosclerotic disease in the ambient P2 and P3 segments bilaterally however. IMPRESSION: Subcentimeter focus of restricted diffusion LEFT parietal cortex and subcortical white matter consistent with acute infarction. This cannot be seen on yesterday's CT. No intracranial hemorrhage. Atrophy and small vessel disease. No proximal intracranial stenosis. Electronically Signed   By: Staci Righter M.D.   On: 02/28/2017 11:13   Mr Brain Wo Contrast  Result Date: 02/28/2017 CLINICAL DATA:  Weakness, confusion, and speech difficulty for 2 days. EXAM: MRI HEAD WITHOUT CONTRAST MRA HEAD WITHOUT CONTRAST TECHNIQUE: Multiplanar, multiecho pulse sequences of the brain and surrounding structures were obtained without intravenous contrast. Angiographic images of the head were obtained using MRA technique without contrast. COMPARISON:  CT head 02/27/2017. FINDINGS: MRI HEAD  FINDINGS Brain: Subcentimeter focus of restricted diffusion in the LEFT parietal cortex and subcortical white matter consistent with acute infarction. No hemorrhage, mass lesion, hydrocephalus, or extra-axial fluid. Mild cerebral and cerebellar atrophy. Mild subcortical and periventricular T2 and FLAIR hyperintensities, likely chronic microvascular ischemic change. Vascular: Flow voids are maintained throughout the carotid, basilar, and vertebral arteries. There are no areas of chronic hemorrhage. Skull and upper cervical spine: Unremarkable visualized calvarium, skullbase, and cervical vertebrae. Partial empty sella. Pineal, cerebellar tonsils unremarkable. No upper cervical cord lesions. Sinuses/Orbits: No orbital masses or proptosis. Globes appear symmetric. Sinuses appear well aerated, without evidence for air-fluid level. Other: None. MRA HEAD FINDINGS The internal carotid arteries are widely patent. The basilar artery is widely patent with RIGHT vertebral dominant. There is no proximal intracranial stenosis or aneurysm. Dolichoectatic vasculature results in artifactual loss of signal in the proximal P1 LEFT PCA segment. It is difficult to exclude intracranial atherosclerotic disease in the ambient P2 and P3 segments bilaterally however. IMPRESSION: Subcentimeter focus of restricted diffusion LEFT parietal cortex and subcortical white matter consistent with acute infarction. This cannot be seen on yesterday's CT. No intracranial hemorrhage. Atrophy and small vessel disease. No proximal intracranial stenosis. Electronically Signed   By: Staci Righter M.D.   On: 02/28/2017 11:13   Dg Chest Port 1 View  Result Date: 02/28/2017 CLINICAL DATA:  Shortness of breath EXAM: PORTABLE CHEST 1 VIEW COMPARISON:  Chest radiograph 04/22/2015 FINDINGS: The heart size and mediastinal contours are within normal limits. Both lungs are clear. The visualized skeletal structures are unremarkable. IMPRESSION: No active disease.  Electronically Signed   By: Ulyses Jarred M.D.   On: 02/28/2017 06:51   Ct Head Code Stroke W/o Cm  Result Date: 02/27/2017 CLINICAL DATA:  Code stroke. Hypertensive, weakness, nausea and vomiting. Confusion and difficulty speaking. History of hypertension, hyperlipidemia, diabetes. EXAM: CT HEAD WITHOUT CONTRAST TECHNIQUE: Contiguous axial images were obtained from the base of the skull through the vertex without intravenous contrast. COMPARISON:  CT HEAD May 20, 2014 FINDINGS: BRAIN: No intraparenchymal hemorrhage, mass effect nor midline shift. The ventricles and sulci are normal for age. Patchy supratentorial white matter hypodensities less than expected for patient's age, though non-specific are most compatible  with chronic small vessel ischemic disease. No acute large vascular territory infarcts. No abnormal extra-axial fluid collections. Basal cisterns are patent. VASCULAR: Dense intracranial vessels most compatible with hemoconcentration. Mild calcific atherosclerosis of the carotid siphons. SKULL: No skull fracture. No significant scalp soft tissue swelling. SINUSES/ORBITS: Mild RIGHT sphenoid mucosal thickening. Mastoid air cells are well aerated. The included ocular globes and orbital contents are non-suspicious. OTHER: Fatty replaced parotid glands. ASPECTS [facility] Stroke Program Early CT Score) - Ganglionic level infarction (caudate, lentiform nuclei, internal capsule, insula, M1-M3 cortex): 7 - Supraganglionic infarction (M4-M6 cortex): 3 Total score (0-10 with 10 being normal): 10 IMPRESSION: 1. Negative CT HEAD for age. Mildly dense intracranial vessels most compatible with hemoconcentration. 2. ASPECTS is 10. Critical Value/emergent results were called by telephone at the time of interpretation on 02/27/2017 at 8:31 pm to Dr. Davonna Belling , who verbally acknowledged these results. Electronically Signed   By: Elon Alas M.D.   On: 02/27/2017 20:31    Time Spent in minutes   30   Lala Lund M.D on 02/28/2017 at 12:08 PM  Between 7am to 7pm - Pager - [phone] ( page via [facility], text pages only, please mention full 10 digit call back number).  After 7pm go to [email or web address] - password [facility]  [facility] -  Office  [phone]

## 2017-02-28 NOTE — Consult Note (Signed)
[place] A. Merlene Laughter, MD     [email or web address]          Brittany Goodman is an 71 y.o. female.   ASSESSMENT/PLAN: 1. Small left parietal cortical/subcortical infarct presenting with aphasia. Risk factors age, hypertension, diabetes and dyslipidemia. The stroke appears to be cryptogenic increase in the chance that this could be cardioembolic especially due to atrial fibrillation. A 30 day event monitor is recommended. Limited vasculitis labs will also be obtained. Aspirin 325 is recommended. The patient should work with her primary care provider to treat her dyslipidemia. She apparently has had intolerance to several statins in the past. Continue with diabetes and blood pressure management/control.   Patient is a 71 year old white female who presents with the acute onset of word finding difficulties lasting for about 2 hours or so. This was witnessed by her son. The patient herself does not report alteration of consciousness although the family thinks that she may have been confused. She does not report having focal numbness or weakness. She denies dizziness but did have a severe headache on presentation. Blood pressure was quite high on presentation resulting in the patient being sent to the ICU. Initial blood pressure reported as 227/140. She had several repeat in the 947S to 962E systolic and 366Q diastolic. She tells me her blood pressure typically does not runs that high. She tells me her blood pressure running 140s over 80s when she sees her primary care provider. Her blood pressure medications have been held on account of permissive hypertension during this acute stroke phase. Interesting, her blood pressure has come down nicely. The patient reports that she is essentially back to baseline. She does have a history of dyslipidemia and has tried several statins but has not tolerated them. The review systems otherwise negative.     GENERAL:  This a very pleasant female who  is resting well and in no acute distress.  HEENT: Normal  ABDOMEN: soft  EXTREMITIES: No edema   BACK: Normal  SKIN: Normal by inspection.    MENTAL STATUS: Alert and oriented - including orientation to her age and the month. Speech, language and cognition are generally intact. Judgment and insight normal.   CRANIAL NERVES: Pupils are equal, round and reactive to light and accomodation; extra ocular movements are full, there is no significant nystagmus; visual fields are full; upper and lower facial muscles are normal in strength and symmetric, there is no flattening of the nasolabial folds; tongue is midline; uvula is midline; shoulder elevation is normal.  MOTOR: Normal tone, bulk and strength; no pronator drift.  COORDINATION: Left finger to nose is normal, right finger to nose is normal, No rest tremor; no intention tremor; no postural tremor; no bradykinesia.  REFLEXES: Deep tendon reflexes are symmetrical and normal. Babinski reflexes are flexor bilaterally.   SENSATION: Normal to light touch, temperature, and pinprick.  NIH stroke scale 0    Blood pressure 140/76, pulse 70, temperature 98.1 F (36.7 C), resp. rate 17, height _0  (1.626 m), weight 181 lb 3.5 oz (82.2 kg), SpO2 95 %.  Past Medical History:  Diagnosis Date  . Arthritis   . Depression   . Diabetes mellitus type II   . Diverticulosis   . GERD (gastroesophageal reflux disease)   . Hyperlipidemia   . Hypertension   . Hypothyroidism   . Sepsis ([place])    2011, Escherichia coli pyelonephritis  . Spinal stenosis     Past Surgical History:  Procedure Laterality Date  .  BACK SURGERY    . CARPAL TUNNEL RELEASE    . COLONOSCOPY N/A 05/29/2013   Procedure: COLONOSCOPY;  Surgeon: Daneil Dolin, MD;  Location: [facility];  Service: Endoscopy;  Laterality: N/A;  12:45-moved to 11:15 [name] notified pt  . ESOPHAGOGASTRODUODENOSCOPY (EGD) WITH ESOPHAGEAL DILATION N/A 05/29/2013   Procedure:  ESOPHAGOGASTRODUODENOSCOPY (EGD) WITH ESOPHAGEAL DILATION;  Surgeon: Daneil Dolin, MD;  Location: [facility];  Service: Endoscopy;  Laterality: N/A;  . ILIOTIBIAL BAND RELEASE    . TOTAL ABDOMINAL HYSTERECTOMY      Family History  Problem Relation Age of Onset  . Heart disease Father   . Heart attack Father 2  . Heart disease Brother   . Colon polyps Brother   . Colon cancer Neg Hx     Social History:  reports that she has never smoked. She has never used smokeless tobacco. She reports that she does not drink alcohol or use drugs.  Allergies:  Allergies  Allergen Reactions  . Codeine Other (See Comments)    "makes me feel bad"  . Dilaudid [Hydromorphone Hcl]     hallucination  . Morphine     vomiting  . Tetracycline     vomiting    Medications: Prior to Admission medications   Medication Sig Start Date End Date Taking? Authorizing Provider  Ascorbic Acid (VITAMIN C) 1000 MG tablet Take 2,000 mg by mouth 2 (two) times daily.    Yes Historical Provider, MD  atenolol (TENORMIN) 100 MG tablet Take 100 mg by mouth daily.     Yes Historical Provider, MD  Cholecalciferol (VITAMIN D) 2000 units CAPS Take 1 capsule by mouth daily.   Yes Historical Provider, MD  citalopram (CELEXA) 20 MG tablet Take 20 mg by mouth every other day.    Yes Historical Provider, MD  cyclobenzaprine (FLEXERIL) 10 MG tablet Take 10 mg by mouth 2 (two) times daily as needed for muscle spasms.    Yes Historical Provider, MD  glimepiride (AMARYL) 1 MG tablet Take 1 mg by mouth daily before breakfast.  04/23/13  Yes Historical Provider, MD  levothyroxine (SYNTHROID, LEVOTHROID) 88 MCG tablet Take 88 mcg by mouth daily. 01/18/17  Yes Historical Provider, MD  lisinopril (PRINIVIL,ZESTRIL) 5 MG tablet Take 5 mg by mouth daily.  03/09/13  Yes Historical Provider, MD  metFORMIN (GLUCOPHAGE) 1000 MG tablet Take 1,000 mg by mouth 2 (two) times daily with a meal.  12/01/16  Yes Historical Provider, MD  omeprazole  (PRILOSEC) 20 MG capsule Take 20 mg by mouth daily.    Yes Historical Provider, MD  vitamin B-12 (CYANOCOBALAMIN) 1000 MCG tablet Take 1,000 mcg by mouth daily.   Yes Historical Provider, MD  acetaminophen (TYLENOL) 325 MG tablet Take 325 mg by mouth every 6 (six) hours as needed for pain.     Historical Provider, MD    Scheduled Meds: . aspirin  324 mg Oral Daily  . atenolol  100 mg Oral Daily  . atorvastatin  40 mg Oral q1800  . citalopram  20 mg Oral QODAY  . enoxaparin (LOVENOX) injection  40 mg Subcutaneous Q24H  . glimepiride  1 mg Oral QAC breakfast  . insulin aspart  0-5 Units Subcutaneous QHS  . insulin aspart  0-9 Units Subcutaneous TID WC  . levothyroxine  100 mcg Oral QAC breakfast  . pantoprazole  40 mg Oral Daily   Continuous Infusions: PRN Meds:.acetaminophen **OR** [DISCONTINUED] acetaminophen, cyclobenzaprine, hydrALAZINE, HYDROcodone-acetaminophen, LORazepam, [DISCONTINUED] ondansetron **OR** ondansetron (ZOFRAN) IV  Results for orders placed or performed during the hospital encounter of 02/27/17 (from the past 48 hour(s))  MRSA PCR Screening     Status: None   Collection Time: 02/27/17  2:00 AM  Result Value Ref Range   MRSA by PCR NEGATIVE NEGATIVE    Comment:        The GeneXpert MRSA Assay (FDA approved for NASAL specimens only), is one component of a comprehensive MRSA colonization surveillance program. It is not intended to diagnose MRSA infection nor to guide or monitor treatment for MRSA infections.   Ethanol     Status: None   Collection Time: 02/27/17  8:12 PM  Result Value Ref Range   Alcohol, Ethyl (B) <5 <5 mg/dL    Comment:        LOWEST DETECTABLE LIMIT FOR SERUM ALCOHOL IS 5 mg/dL FOR MEDICAL PURPOSES ONLY   Protime-INR     Status: None   Collection Time: 02/27/17  8:12 PM  Result Value Ref Range   Prothrombin Time 12.9 11.4 - 15.2 seconds   INR 0.97   APTT     Status: None   Collection Time: 02/27/17  8:12 PM  Result Value  Ref Range   aPTT 26 24 - 36 seconds  CBC     Status: None   Collection Time: 02/27/17  8:12 PM  Result Value Ref Range   WBC 9.9 4.0 - 10.5 K/uL   RBC 4.63 3.87 - 5.11 MIL/uL   Hemoglobin 13.7 12.0 - 15.0 g/dL   HCT 40.8 36.0 - 46.0 %   MCV 88.1 78.0 - 100.0 fL   MCH 29.6 26.0 - 34.0 pg   MCHC 33.6 30.0 - 36.0 g/dL   RDW 13.7 11.5 - 15.5 %   Platelets 276 150 - 400 K/uL  Differential     Status: None   Collection Time: 02/27/17  8:12 PM  Result Value Ref Range   Neutrophils Relative % 54 %   Neutro Abs 5.2 1.7 - 7.7 K/uL   Lymphocytes Relative 36 %   Lymphs Abs 3.6 0.7 - 4.0 K/uL   Monocytes Relative 8 %   Monocytes Absolute 0.8 0.1 - 1.0 K/uL   Eosinophils Relative 2 %   Eosinophils Absolute 0.2 0.0 - 0.7 K/uL   Basophils Relative 0 %   Basophils Absolute 0.0 0.0 - 0.1 K/uL  Comprehensive metabolic panel     Status: Abnormal   Collection Time: 02/27/17  8:12 PM  Result Value Ref Range   Sodium 133 (L) 135 - 145 mmol/L   Potassium 3.8 3.5 - 5.1 mmol/L   Chloride 98 (L) 101 - 111 mmol/L   CO2 28 22 - 32 mmol/L   Glucose, Bld 91 65 - 99 mg/dL   BUN 14 6 - 20 mg/dL   Creatinine, Ser 0.89 0.44 - 1.00 mg/dL   Calcium 9.3 8.9 - 10.3 mg/dL   Total Protein 7.7 6.5 - 8.1 g/dL   Albumin 4.3 3.5 - 5.0 g/dL   AST 17 15 - 41 U/L   ALT 15 14 - 54 U/L   Alkaline Phosphatase 52 38 - 126 U/L   Total Bilirubin 0.3 0.3 - 1.2 mg/dL   GFR calc non Af Amer >60 >60 mL/min   GFR calc Af Amer >60 >60 mL/min    Comment: (NOTE) The eGFR has been calculated using the CKD EPI equation. This calculation has not been validated in all clinical situations. eGFR's persistently <60 mL/min signify possible Chronic Kidney Disease.  Anion gap 7 5 - 15  TSH     Status: None   Collection Time: 02/27/17  8:12 PM  Result Value Ref Range   TSH 2.808 0.350 - 4.500 uIU/mL    Comment: Performed by a 3rd Generation assay with a functional sensitivity of <=0.01 uIU/mL.  I-stat troponin, ED (not at [name],  [facility])     Status: None   Collection Time: 02/27/17  8:34 PM  Result Value Ref Range   Troponin i, poc 0.00 0.00 - 0.08 ng/mL   Comment 3            Comment: Due to the release kinetics of cTnI, a negative result within the first hours of the onset of symptoms does not rule out myocardial infarction with certainty. If myocardial infarction is still suspected, repeat the test at appropriate intervals.   I-Stat Chem 8, ED  (not at [facility], [facility])     Status: Abnormal   Collection Time: 02/27/17  8:36 PM  Result Value Ref Range   Sodium 134 (L) 135 - 145 mmol/L   Potassium 3.9 3.5 - 5.1 mmol/L   Chloride 97 (L) 101 - 111 mmol/L   BUN 14 6 - 20 mg/dL   Creatinine, Ser 1.00 0.44 - 1.00 mg/dL   Glucose, Bld 88 65 - 99 mg/dL   Calcium, Ion 1.13 (L) 1.15 - 1.40 mmol/L   TCO2 27 0 - 100 mmol/L   Hemoglobin 14.6 12.0 - 15.0 g/dL   HCT 43.0 36.0 - 46.0 %  Urine rapid drug screen (hosp performed)not at [facility]     Status: None   Collection Time: 02/27/17 10:20 PM  Result Value Ref Range   Opiates NONE DETECTED NONE DETECTED   Cocaine NONE DETECTED NONE DETECTED   Benzodiazepines NONE DETECTED NONE DETECTED   Amphetamines NONE DETECTED NONE DETECTED   Tetrahydrocannabinol NONE DETECTED NONE DETECTED   Barbiturates NONE DETECTED NONE DETECTED    Comment:        DRUG SCREEN FOR MEDICAL PURPOSES ONLY.  IF CONFIRMATION IS NEEDED FOR ANY PURPOSE, NOTIFY LAB WITHIN 5 DAYS.        LOWEST DETECTABLE LIMITS FOR URINE DRUG SCREEN Drug Class       Cutoff (ng/mL) Amphetamine      1000 Barbiturate      200 Benzodiazepine   625 Tricyclics       638 Opiates          300 Cocaine          300 THC              50   Urinalysis, Routine w reflex microscopic     Status: Abnormal   Collection Time: 02/27/17 10:20 PM  Result Value Ref Range   Color, Urine STRAW (A) YELLOW   APPearance CLEAR CLEAR   Specific Gravity, Urine 1.006 1.005 - 1.030   pH 7.0 5.0 - 8.0   Glucose, UA NEGATIVE NEGATIVE mg/dL    Hgb urine dipstick NEGATIVE NEGATIVE   Bilirubin Urine NEGATIVE NEGATIVE   Ketones, ur NEGATIVE NEGATIVE mg/dL   Protein, ur NEGATIVE NEGATIVE mg/dL   Nitrite NEGATIVE NEGATIVE   Leukocytes, UA SMALL (A) NEGATIVE   RBC / HPF 0-5 0 - 5 RBC/hpf   WBC, UA 0-5 0 - 5 WBC/hpf   Bacteria, UA RARE (A) NONE SEEN   Squamous Epithelial / LPF 0-5 (A) NONE SEEN  Glucose, capillary     Status: Abnormal   Collection Time: 02/27/17 11:34 PM  Result Value Ref Range   Glucose-Capillary 103 (H) 65 - 99 mg/dL   Comment 1 Notify RN   CBC     Status: Abnormal   Collection Time: 02/28/17  4:11 AM  Result Value Ref Range   WBC 11.2 (H) 4.0 - 10.5 K/uL   RBC 4.65 3.87 - 5.11 MIL/uL   Hemoglobin 13.3 12.0 - 15.0 g/dL   HCT 41.0 36.0 - 46.0 %   MCV 88.2 78.0 - 100.0 fL   MCH 28.6 26.0 - 34.0 pg   MCHC 32.4 30.0 - 36.0 g/dL   RDW 13.7 11.5 - 15.5 %   Platelets 284 150 - 400 K/uL  Comprehensive metabolic panel     Status: Abnormal   Collection Time: 02/28/17  4:11 AM  Result Value Ref Range   Sodium 130 (L) 135 - 145 mmol/L   Potassium 4.1 3.5 - 5.1 mmol/L   Chloride 93 (L) 101 - 111 mmol/L   CO2 27 22 - 32 mmol/L   Glucose, Bld 132 (H) 65 - 99 mg/dL   BUN 12 6 - 20 mg/dL   Creatinine, Ser 0.73 0.44 - 1.00 mg/dL   Calcium 9.0 8.9 - 10.3 mg/dL   Total Protein 7.2 6.5 - 8.1 g/dL   Albumin 4.0 3.5 - 5.0 g/dL   AST 18 15 - 41 U/L   ALT 14 14 - 54 U/L   Alkaline Phosphatase 52 38 - 126 U/L   Total Bilirubin 0.4 0.3 - 1.2 mg/dL   GFR calc non Af Amer >60 >60 mL/min   GFR calc Af Amer >60 >60 mL/min    Comment: (NOTE) The eGFR has been calculated using the CKD EPI equation. This calculation has not been validated in all clinical situations. eGFR's persistently <60 mL/min signify possible Chronic Kidney Disease.    Anion gap 10 5 - 15  Lipid panel     Status: Abnormal   Collection Time: 02/28/17  4:11 AM  Result Value Ref Range   Cholesterol 254 (H) 0 - 200 mg/dL   Triglycerides 344 (H) <150  mg/dL   HDL 49 >40 mg/dL   Total CHOL/HDL Ratio 5.2 RATIO   VLDL 69 (H) 0 - 40 mg/dL   LDL Cholesterol 136 (H) 0 - 99 mg/dL    Comment:        Total Cholesterol/HDL:CHD Risk Coronary Heart Disease Risk Table                     Men   Women  1/2 Average Risk   3.4   3.3  Average Risk       5.0   4.4  2 X Average Risk   9.6   7.1  3 X Average Risk  23.4   11.0        Use the calculated Patient Ratio above and the CHD Risk Table to determine the patient's CHD Risk.        ATP III CLASSIFICATION (LDL):  <100     mg/dL   Optimal  100-129  mg/dL   Near or Above                    Optimal  130-159  mg/dL   Borderline  160-189  mg/dL   High  >190     mg/dL   Very High   Glucose, capillary     Status: Abnormal   Collection Time: 02/28/17  7:34 AM  Result Value Ref Range  Glucose-Capillary 133 (H) 65 - 99 mg/dL  Glucose, capillary     Status: None   Collection Time: 02/28/17 11:06 AM  Result Value Ref Range   Glucose-Capillary 98 65 - 99 mg/dL  Glucose, capillary     Status: Abnormal   Collection Time: 02/28/17  4:24 PM  Result Value Ref Range   Glucose-Capillary 100 (H) 65 - 99 mg/dL    Studies/Results:  BRAIN MRI MRA FINDINGS: MRI HEAD FINDINGS  Brain: Subcentimeter focus of restricted diffusion in the LEFT parietal cortex and subcortical white matter consistent with acute infarction. No hemorrhage, mass lesion, hydrocephalus, or extra-axial fluid.  Mild cerebral and cerebellar atrophy. Mild subcortical and periventricular T2 and FLAIR hyperintensities, likely chronic microvascular ischemic change.  Vascular: Flow voids are maintained throughout the carotid, basilar, and vertebral arteries. There are no areas of chronic hemorrhage.  Skull and upper cervical spine: Unremarkable visualized calvarium, skullbase, and cervical vertebrae. Partial empty sella. Pineal, cerebellar tonsils unremarkable. No upper cervical cord lesions.  Sinuses/Orbits: No orbital  masses or proptosis. Globes appear symmetric. Sinuses appear well aerated, without evidence for air-fluid level.  Other: None.      MRA HEAD FINDINGS  The internal carotid arteries are widely patent. The basilar artery is widely patent with RIGHT vertebral dominant. There is no proximal intracranial stenosis or aneurysm. Dolichoectatic vasculature results in artifactual loss of signal in the proximal P1 LEFT PCA segment. It is difficult to exclude intracranial atherosclerotic disease in the ambient P2 and P3 segments bilaterally however.  IMPRESSION: Subcentimeter focus of restricted diffusion LEFT parietal cortex and subcortical white matter consistent with acute infarction. This cannot be seen on yesterday's CT.  No intracranial hemorrhage.  Atrophy and small vessel disease.  No proximal intracranial stenosis.     That was     CAROTID DOPPLERS normal    ECHO - Left ventricle: The cavity size was normal. Wall thickness was   increased in a pattern of mild LVH. Systolic function was normal.   The estimated ejection fraction was in the range of 60% to 65%.   Wall motion was normal; there were no regional wall motion   abnormalities. Doppler parameters are consistent with abnormal   left ventricular relaxation (grade 1 diastolic dysfunction). - Aortic valve: Moderately calcified annulus. Trileaflet. - Mitral valve: Mildly calcified annulus.       The brain MRI is reviewed in person and shows a small Left parietal infarct on diffusion imaging seen on 2 cuts. Involves the juxtacortical region. There is mild chronic ischemic changes seen on FLAIR imaging. No hemorrhage is appreciated. No chronic infarcts are seen.   Linea Calles A. Merlene Laughter, M.D.  Diplomate, Tax adviser of Psychiatry and Neurology ( Neurology). 02/28/2017, 6:46 PM

## 2017-02-28 NOTE — Progress Notes (Signed)
*  PRELIMINARY RESULTS* Echocardiogram 2D Echocardiogram has been performed.  Brittany Goodman 02/28/2017, 2:24 PM

## 2017-02-28 NOTE — Care Management Obs Status (Signed)
[place] NOTIFICATION   Patient Details  Name: Brittany Goodman MRN: [id number] Date of Birth: 06-Aug-1946   Medicare Observation Status Notification Given:  Yes    [name], Chauncey [place], RN 02/28/2017, 2:38 PM

## 2017-02-28 NOTE — Plan of Care (Signed)
Problem: Self-Care: Goal: Ability to communicate needs accurately will improve Outcome: Completed/Met Date Met: 02/28/17 Pt able to verbalize needs. No speech deficits  Noted.

## 2017-03-01 ENCOUNTER — Inpatient Hospital Stay (HOSPITAL_COMMUNITY): Payer: Medicare Other

## 2017-03-01 DIAGNOSIS — I639 Cerebral infarction, unspecified: Principal | ICD-10-CM

## 2017-03-01 DIAGNOSIS — R4701 Aphasia: Secondary | ICD-10-CM

## 2017-03-01 DIAGNOSIS — R531 Weakness: Secondary | ICD-10-CM

## 2017-03-01 LAB — GLUCOSE, CAPILLARY
GLUCOSE-CAPILLARY: 113 mg/dL — AB (ref 65–99)
Glucose-Capillary: 104 mg/dL — ABNORMAL HIGH (ref 65–99)
Glucose-Capillary: 82 mg/dL (ref 65–99)
Glucose-Capillary: 102 mg/dL — ABNORMAL HIGH (ref 65–99)

## 2017-03-01 LAB — BASIC METABOLIC PANEL
Anion gap: 8 (ref 5–15)
BUN: 12 mg/dL (ref 6–20)
CO2: 25 mmol/L (ref 22–32)
Calcium: 8.8 mg/dL — ABNORMAL LOW (ref 8.9–10.3)
Chloride: 94 mmol/L — ABNORMAL LOW (ref 101–111)
Creatinine, Ser: 0.73 mg/dL (ref 0.44–1.00)
GFR calc Af Amer: >60 mL/min (ref >60)
GFR calc non Af Amer: >60 mL/min (ref >60)
Glucose, Bld: 106 mg/dL — ABNORMAL HIGH (ref 65–99)
Potassium: 3.8 mmol/L (ref 3.5–5.1)
Sodium: 127 mmol/L — ABNORMAL LOW (ref 135–145)

## 2017-03-01 LAB — HEMOGLOBIN A1C
HEMOGLOBIN A1C: 6.1 % — AB (ref 4.8–5.6)
MEAN PLASMA GLUCOSE: 128 mg/dL

## 2017-03-01 LAB — SODIUM: SODIUM: 129 mmol/L — AB (ref 135–145)

## 2017-03-01 LAB — SEDIMENTATION RATE: Sed Rate: 10 mm/hr (ref 0–22)

## 2017-03-01 NOTE — Plan of Care (Signed)
Problem: Self-Care: Goal: Ability to participate in self-care as condition permits will improve Outcome: Progressing Still requiring some standby assit when oob

## 2017-03-01 NOTE — Progress Notes (Signed)
AFTER PT HAD GOTTEN UP TO W/ P.T. SHE TRANSFERRED TO RECLINER/CHAIR. WHEN PT WENT IN TO CHANGE IV SITE AT PT'S REQUEST, PT WAS OBSERVED TO BE EXTREMELY FATIGUED AND HAD DIFFICULTY FINDING WORDS TO EXPRESS HER THOUGHTS.

## 2017-03-01 NOTE — Evaluation (Signed)
Physical Therapy Evaluation Patient Details Name: Brittany Goodman MRN: [id number] DOB: 09/28/1946 Today's Date: 03/01/2017   History of Present Illness  Brittany Goodman  is a 71 y.o. female, with history of diabetes mellitus hypertension, chronic back pain, Who was brought to the hospital for confusion and difficulty finding words. Patient was seen normal around 4 PM. Family member stated that patient at 4:30 PM was confused and not making sense, while talking. She had double getting the right words. She did not have slurred speech or focal weakness of extremities. No gait disturbance. No visual problems.  MRI (+) Subcentimeter focus of restricted diffusion LEFT parietal cortex and subcortical white matter consistent with acute infarction.    Clinical Impression  Pt received in bed, son present, and pt is agreeable to PT evaluation.  Pt is normally independent with community ambulation, however, she states that she is a furniture walker and occasionally uses a cane for balance when outside.  During PT evaluation she ambulated 464f with no device, but she was reaching out for objects to steady herself on.  She is recommended for OPPT for balance.    Follow Up Recommendations Outpatient PT;Other (comment) (balance)    Equipment Recommendations  None recommended by PT    Recommendations for Other Services       Precautions / Restrictions Precautions Precautions: Fall Precaution Comments: No falls in the past 6 months, but minor balance deficits at baseline Restrictions Weight Bearing Restrictions: No      Mobility  Bed Mobility Overal bed mobility: Modified Independent                Transfers Overall transfer level: Modified independent Equipment used: None                Ambulation/Gait Ambulation/Gait assistance: Supervision Ambulation Distance (Feet): 400 Feet Assistive device: None Gait Pattern/deviations: Drifts right/left;Step-through pattern     General  Gait Details: Pt demonstrates an occasional drifting right/left with correction via scissor stepping strategy.  Pt also reaches out for objects to hold onto while ambulating.  Pt states that she is near baseline.   Stairs            Wheelchair Mobility    Modified Rankin (Stroke Patients Only) Modified Rankin (Stroke Patients Only) Pre-Morbid Rankin Score: No significant disability Modified Rankin: No significant disability     Balance Overall balance assessment: Needs assistance Sitting-balance support: Bilateral upper extremity supported;Feet supported Sitting balance-Leahy Scale: Good     Standing balance support: Bilateral upper extremity supported Standing balance-Leahy Scale: Fair                               Pertinent Vitals/Pain Pain Assessment: 0-10 Pain Score: 7  Pain Location: back Pain Descriptors / Indicators: Burning;Aching;Cramping;Sharp Pain Intervention(s): Limited activity within patient's tolerance;Monitored during session;Repositioned    Home Living   Living Arrangements: Children (son, dtr in [employer] and grandson) Available Help at Discharge: Available PRN/intermittently;Other (Comment) (most of the time) Type of Home: House Home Access: Level entry     Home Layout: Two level Home Equipment: Shower seat;Cane - single point;Walker - standard      Prior Function Level of Independence: Independent   Gait / Transfers Assistance Needed: independent, however she admits to being a furniture walker, and occasionally uses her cane, or a "hoe" when outside to stabilize herself.   ADL's / Homemaking Assistance Needed: independent  Comments: driving - and still running  errands.  Able to complete grocery shopping with shopping cart.      Hand Dominance   Dominant Hand: Right    Extremity/Trunk Assessment   Upper Extremity Assessment Upper Extremity Assessment: Overall WFL for tasks assessed    Lower Extremity Assessment Lower  Extremity Assessment: Overall WFL for tasks assessed       Communication   Communication: No difficulties  Cognition Arousal/Alertness: Awake/alert Behavior During Therapy: WFL for tasks assessed/performed Overall Cognitive Status: Within Functional Limits for tasks assessed                                        General Comments      Exercises     Assessment/Plan    PT Assessment All further PT needs can be met in the next venue of care  PT Problem List Decreased balance       PT Treatment Interventions      PT Goals (Current goals can be found in the Care Plan section)  Acute Rehab PT Goals Patient Stated Goal: Pt wants to go home.  PT Goal Formulation: All assessment and education complete, DC therapy    Frequency     Barriers to discharge        Co-evaluation               End of Session Equipment Utilized During Treatment: Gait belt Activity Tolerance: Patient tolerated treatment well Patient left: in chair;with call bell/phone within reach Nurse Communication: Mobility status Jenny Reichmann, RN aware of pt's mobility status, and mobility sheet left in the room. ) PT Visit Diagnosis: Other abnormalities of gait and mobility (R26.89);Muscle weakness (generalized) (M62.81)    Time: 1000-1020 PT Time Calculation (min) (ACUTE ONLY): 20 min   Charges:   PT Evaluation $PT Eval Low Complexity: 1 Procedure     PT G Codes:   PT G-Codes **NOT FOR INPATIENT CLASS** Functional Assessment Tool Used: AM-PAC 6 Clicks Basic Mobility;Clinical judgement Functional Limitation: Mobility: Walking and moving around Mobility: Walking and Moving Around Current Status (K9179): At least 20 percent but less than 40 percent impaired, limited or restricted Mobility: Walking and Moving Around Goal Status [phone]): At least 20 percent but less than 40 percent impaired, limited or restricted Mobility: Walking and Moving Around Discharge Status [phone]): At least 20  percent but less than 40 percent impaired, limited or restricted    Beth Tanuj Mullens, PT, DPT X: [phone]

## 2017-03-01 NOTE — Care Management Important Message (Signed)
Important Message  Patient Details  Name: BEE MARCHIANO MRN: [id number] Date of Birth: 04/02/1946   Medicare Important Message Given:  Yes    Raquel Sayres, Chauncey Reading, RN 03/01/2017, 2:53 PM

## 2017-03-01 NOTE — Care Management Note (Signed)
Case Management Note  Patient Details  Name: ASHONTE ANGELUCCI MRN: [id number] Date of Birth: 13-Jul-1946     Expected Discharge Date:      03/02/2017            Expected Discharge Plan:  Home/Self Care  In-House Referral:     Discharge planning Services  CM Consult  Post Acute Care Choice:    Choice offered to:  NA  DME Arranged:    DME Agency:     HH Arranged:    [place] Agency:     Status of Service:  Completed, signed off  If discussed at [employer] of Stay Meetings, dates discussed:    Additional Comments: Patient recommended for OP PT.  Offered choice of OP Centers. Electronic referral sent to [facility].   Lashauna Arpin, Chauncey Reading, RN 03/01/2017, 2:46 PM

## 2017-03-01 NOTE — Progress Notes (Addendum)
PROGRESS NOTE                                                                                                                                                                                                             Patient Demographics:    Brittany Goodman, is a 71 y.o. female, DOB - 29-Nov-1945, ULA:[id number]  Admit date - 02/27/2017   Admitting Physician Oswald Hillock, MD  Outpatient Primary MD for the patient is [place]  LOS - 1  Chief Complaint  Patient presents with  . Code Stroke       Brief Narrative   Brittany Goodman  is a 71 y.o. female, with history of diabetes mellitus hypertension, chronic back pain, Who was brought to the hospital for confusion and difficulty finding words, She was admitted with the running diagnosis of hypertensive crisis however further workup showed small ischemic stroke on MRI.   Subjective:   Patient and son reported some difficulty finding words after working with PT, vital signs were wnl.  Repeat CT, does not show any new stroke.    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 Much improved due to small left parietal acute infarct on MRI. Currently she is doing much better, placed on aspirin and statin, LDL over 70 A1c is 6.1 Echocardiogram, carotid duplex,  Done. PT, OT, speech and neurology have been consulted. No focal deficits on exam. Repeat CT done today for some word finding difficulty after working with PT.  It was negative for new stroke.  Cardiology consulted for event monitor placement.   2. Hypertension. , allow for permissive hypertension. Resume meds on discharge.   3. Dyslipidemia. Placed on statin.  4. DM type II. On sliding scale along with home dose Amaryl.   CBG (last 3)   Recent  Labs  02/28/17 2126 03/01/17 0717 03/01/17 1112  GLUCAP 90 104* 82    Lab Results  Component Value Date   HGBA1C 6.1 (H) 02/27/2017  better controlled.   Hyponatremia: asymptomatic. Had low sodium levels in 2011 too, will monitor.   Diet : Diet heart healthy/carb modified Room service appropriate? Yes; Fluid consistency: Thin    Family Communication  :  son  Code Status :  Full  Disposition Plan  :  Home tomorrow after event monitor is placed.   Consults  :  Neurp  Procedures  :    DVT Prophylaxis  :  Lovenox    Lab Results  Component Value Date   PLT 284 02/28/2017    Inpatient Medications  Scheduled Meds: . aspirin  324 mg Oral Daily  . atenolol  100 mg Oral Daily  . atorvastatin  40 mg Oral q1800  . citalopram  20 mg Oral QODAY  . enoxaparin (LOVENOX) injection  40 mg Subcutaneous Q24H  . glimepiride  1 mg Oral QAC breakfast  . insulin aspart  0-5 Units Subcutaneous QHS  . insulin aspart  0-9 Units Subcutaneous TID WC  . levothyroxine  100 mcg Oral QAC breakfast  . pantoprazole  40 mg Oral Daily   Continuous Infusions: PRN Meds:.acetaminophen **OR** [DISCONTINUED] acetaminophen, cyclobenzaprine, hydrALAZINE, HYDROcodone-acetaminophen, LORazepam, [DISCONTINUED] ondansetron **OR** ondansetron (ZOFRAN) IV  Antibiotics  :    Anti-infectives    None         Objective:   Vitals:   03/01/17 1100 03/01/17 1113 03/01/17 1200 03/01/17 1305  BP: (!) 147/89  (!) 157/104 (!) 185/104  Pulse: 63  65 63  Resp: 14  16 12   Temp:  98 F (36.7 C)    TempSrc:  Oral    SpO2: 97%  99% 96%  Weight:      Height:        Wt Readings from Last 3 Encounters:  03/01/17 82.4 kg (181 lb 10.5 oz)  06/11/13 88.5 kg (195 lb)  05/06/13 86 kg (189 lb 9.6 oz)     Intake/Output Summary (Last 24 hours) at 03/01/17 1358 Last data filed at 03/01/17 1200  Gross per 24 hour  Intake              480 ml  Output             3000 ml  Net            -2520 ml     Physical  Exam  Awake Alert, Oriented X 3, No new F.N deficits, Normal affect [place].AT,PERRAL Supple Neck,No JVD,   Symmetrical Chest wall movement, Good air movement bilaterally, CTAB RRR,No Gallops,Rubs or new Murmurs, No Parasternal Heave +ve B.Sounds, Abd Soft, No tenderness, No organomegaly appriciated, No rebound - guarding or rigidity. No Cyanosis, Clubbing or edema, No new Rash or bruise       Data Review:    CBC  Recent Labs Lab 02/27/17 2012 02/27/17 2036 02/28/17 0411  WBC 9.9  --  11.2*  HGB 13.7 14.6 13.3  HCT 40.8 43.0 41.0  PLT 276  --  284  MCV 88.1  --  88.2  MCH 29.6  --  28.6  MCHC 33.6  --  32.4  RDW 13.7  --  13.7  LYMPHSABS 3.6  --   --   MONOABS 0.8  --   --   EOSABS 0.2  --   --   BASOSABS 0.0  --   --     Chemistries   Recent Labs Lab 02/27/17 2012 02/27/17 2036 02/28/17 0411 03/01/17 0512 03/01/17 1119  NA 133* 134* 130* 127* 129*  K 3.8 3.9 4.1 3.8  --   CL 98* 97* 93* 94*  --   CO2 28  --  27 25  --   GLUCOSE 91 88 132* 106*  --   BUN  14 14 12 12   --   CREATININE 0.89 1.00 0.73 0.73  --   CALCIUM 9.3  --  9.0 8.8*  --   AST 17  --  18  --   --   ALT 15  --  14  --   --   ALKPHOS 52  --  52  --   --   BILITOT 0.3  --  0.4  --   --    ------------------------------------------------------------------------------------------------------------------  Recent Labs  02/28/17 0411  CHOL 254*  HDL 49  LDLCALC 136*  TRIG 344*  CHOLHDL 5.2    Lab Results  Component Value Date   HGBA1C 6.1 (H) 02/27/2017   ------------------------------------------------------------------------------------------------------------------  Recent Labs  02/27/17 2012  TSH 2.808   ------------------------------------------------------------------------------------------------------------------ No results for input(s): VITAMINB12, FOLATE, FERRITIN, TIBC, IRON, RETICCTPCT in the last 72 hours.  Coagulation profile  Recent Labs Lab 02/27/17 2012  INR  0.97    No results for input(s): DDIMER in the last 72 hours.  Cardiac Enzymes No results for input(s): CKMB, TROPONINI, MYOGLOBIN in the last 168 hours.  Invalid input(s): CK ------------------------------------------------------------------------------------------------------------------ No results found for: BNP  Micro Results Recent Results (from the past 240 hour(s))  MRSA PCR Screening     Status: None   Collection Time: 02/27/17  2:00 AM  Result Value Ref Range Status   MRSA by PCR NEGATIVE NEGATIVE Final    Comment:        The GeneXpert MRSA Assay (FDA approved for NASAL specimens only), is one component of a comprehensive MRSA colonization surveillance program. It is not intended to diagnose MRSA infection nor to guide or monitor treatment for MRSA infections.     Radiology Reports Ct Head Wo Contrast  Result Date: 03/01/2017 CLINICAL DATA:  Confusion and altered mental status.  Dysarthria. EXAM: CT HEAD WITHOUT CONTRAST TECHNIQUE: Contiguous axial images were obtained from the base of the skull through the vertex without intravenous contrast. COMPARISON:  Head CT February 27, 2017 and brain MRI February 28, 2017 FINDINGS: Brain: Age related volume loss is stable. There is no intracranial mass, hemorrhage, extra-axial fluid collection, or midline shift. The small infarct in the left inferior parietal lobe seen on MR 1 day prior is not appreciable by noncontrast enhanced head CT examination. On this current examination, no gray-white compartments lesions are identified beyond rather minimal small vessel disease in the posterior centra semiovale bilaterally. No new areas of decreased attenuation evident. Vascular: There is no appreciable hyperdense vessel. There is calcification in each carotid siphon region. Skull: Bony calvarium appears intact. Sinuses/Orbits: There is mucosal thickening in several ethmoid air cells bilaterally. There is opacification right sphenoid sinus with  air-fluid level. Other visualized paranasal sinuses are clear. Visualized orbits appear symmetric and normal bilaterally. Other: Mastoid air cells are clear. IMPRESSION: The known small acute infarct in the left parietal lobe is not evident by CT. This infarct was present on MR 1 day prior. There is no new gray-white compartment lesion appreciable by CT. There is no mass, hemorrhage, or extra-axial fluid collection. There is vascular calcification in each carotid siphon region. Areas of paranasal sinus disease noted. There is mucosal thickening in the right sphenoid sinus with an air-fluid level. This finding is more prominent than on recent CT from 2 days prior. Electronically Signed   By: Lowella Grip III M.D.   On: 03/01/2017 13:01   Mr Jodene Nam Head Wo Contrast  Result Date: 02/28/2017 CLINICAL DATA:  Weakness, confusion, and speech  difficulty for 2 days. EXAM: MRI HEAD WITHOUT CONTRAST MRA HEAD WITHOUT CONTRAST TECHNIQUE: Multiplanar, multiecho pulse sequences of the brain and surrounding structures were obtained without intravenous contrast. Angiographic images of the head were obtained using MRA technique without contrast. COMPARISON:  CT head 02/27/2017. FINDINGS: MRI HEAD FINDINGS Brain: Subcentimeter focus of restricted diffusion in the LEFT parietal cortex and subcortical white matter consistent with acute infarction. No hemorrhage, mass lesion, hydrocephalus, or extra-axial fluid. Mild cerebral and cerebellar atrophy. Mild subcortical and periventricular T2 and FLAIR hyperintensities, likely chronic microvascular ischemic change. Vascular: Flow voids are maintained throughout the carotid, basilar, and vertebral arteries. There are no areas of chronic hemorrhage. Skull and upper cervical spine: Unremarkable visualized calvarium, skullbase, and cervical vertebrae. Partial empty sella. Pineal, cerebellar tonsils unremarkable. No upper cervical cord lesions. Sinuses/Orbits: No orbital masses or  proptosis. Globes appear symmetric. Sinuses appear well aerated, without evidence for air-fluid level. Other: None. MRA HEAD FINDINGS The internal carotid arteries are widely patent. The basilar artery is widely patent with RIGHT vertebral dominant. There is no proximal intracranial stenosis or aneurysm. Dolichoectatic vasculature results in artifactual loss of signal in the proximal P1 LEFT PCA segment. It is difficult to exclude intracranial atherosclerotic disease in the ambient P2 and P3 segments bilaterally however. IMPRESSION: Subcentimeter focus of restricted diffusion LEFT parietal cortex and subcortical white matter consistent with acute infarction. This cannot be seen on yesterday's CT. No intracranial hemorrhage. Atrophy and small vessel disease. No proximal intracranial stenosis. Electronically Signed   By: Staci Righter M.D.   On: 02/28/2017 11:13   Mr Brain Wo Contrast  Result Date: 02/28/2017 CLINICAL DATA:  Weakness, confusion, and speech difficulty for 2 days. EXAM: MRI HEAD WITHOUT CONTRAST MRA HEAD WITHOUT CONTRAST TECHNIQUE: Multiplanar, multiecho pulse sequences of the brain and surrounding structures were obtained without intravenous contrast. Angiographic images of the head were obtained using MRA technique without contrast. COMPARISON:  CT head 02/27/2017. FINDINGS: MRI HEAD FINDINGS Brain: Subcentimeter focus of restricted diffusion in the LEFT parietal cortex and subcortical white matter consistent with acute infarction. No hemorrhage, mass lesion, hydrocephalus, or extra-axial fluid. Mild cerebral and cerebellar atrophy. Mild subcortical and periventricular T2 and FLAIR hyperintensities, likely chronic microvascular ischemic change. Vascular: Flow voids are maintained throughout the carotid, basilar, and vertebral arteries. There are no areas of chronic hemorrhage. Skull and upper cervical spine: Unremarkable visualized calvarium, skullbase, and cervical vertebrae. Partial empty sella.  Pineal, cerebellar tonsils unremarkable. No upper cervical cord lesions. Sinuses/Orbits: No orbital masses or proptosis. Globes appear symmetric. Sinuses appear well aerated, without evidence for air-fluid level. Other: None. MRA HEAD FINDINGS The internal carotid arteries are widely patent. The basilar artery is widely patent with RIGHT vertebral dominant. There is no proximal intracranial stenosis or aneurysm. Dolichoectatic vasculature results in artifactual loss of signal in the proximal P1 LEFT PCA segment. It is difficult to exclude intracranial atherosclerotic disease in the ambient P2 and P3 segments bilaterally however. IMPRESSION: Subcentimeter focus of restricted diffusion LEFT parietal cortex and subcortical white matter consistent with acute infarction. This cannot be seen on yesterday's CT. No intracranial hemorrhage. Atrophy and small vessel disease. No proximal intracranial stenosis. Electronically Signed   By: Staci Righter M.D.   On: 02/28/2017 11:13   US Carotid Bilateral  Result Date: 02/28/2017 CLINICAL DATA:  Vertigo with altered mental status.  Dysarthria. EXAM: BILATERAL CAROTID DUPLEX ULTRASOUND TECHNIQUE: Pearline Cables scale imaging, color Doppler and duplex ultrasound were performed of bilateral carotid and vertebral arteries in the neck. COMPARISON:  05/22/2014 FINDINGS: Criteria: Quantification of carotid stenosis is based on velocity parameters that correlate the residual internal carotid diameter with NASCET-based stenosis levels, using the diameter of the distal internal carotid lumen as the denominator for stenosis measurement. The following velocity measurements were obtained: RIGHT ICA:  110 cm/sec CCA:  63 cm/sec SYSTOLIC ICA/CCA RATIO:  1.7 DIASTOLIC ICA/CCA RATIO:  3.0 ECA:  90 cm/sec LEFT ICA:  66 cm/sec CCA:  65 cm/sec SYSTOLIC ICA/CCA RATIO:  1.0 DIASTOLIC ICA/CCA RATIO:  1.1 ECA:  85 cm/sec RIGHT CAROTID ARTERY: Minimal plaque at the right carotid bulb. External carotid artery is  patent with normal waveform. Normal waveforms and velocities in the internal carotid artery. RIGHT VERTEBRAL ARTERY: Antegrade flow and normal waveform in the right vertebral artery. LEFT CAROTID ARTERY: Intimal thickening in the left common carotid artery. External carotid artery is patent with normal waveform. Intimal thickening or minimal plaque in the proximal internal carotid artery. Normal waveforms and velocities in the internal carotid artery. LEFT VERTEBRAL ARTERY: Antegrade flow and normal waveform in the left vertebral artery. IMPRESSION: Minimal plaque in the carotid arteries. No significant carotid artery stenosis. Estimated degree of stenosis in the internal carotid arteries is less than 50% bilaterally. Patent vertebral arteries with antegrade flow. Electronically Signed   By: Markus Daft M.D.   On: 02/28/2017 16:25   Dg Chest Port 1 View  Result Date: 02/28/2017 CLINICAL DATA:  Shortness of breath EXAM: PORTABLE CHEST 1 VIEW COMPARISON:  Chest radiograph 04/22/2015 FINDINGS: The heart size and mediastinal contours are within normal limits. Both lungs are clear. The visualized skeletal structures are unremarkable. IMPRESSION: No active disease. Electronically Signed   By: Ulyses Jarred M.D.   On: 02/28/2017 06:51   Ct Head Code Stroke W/o Cm  Result Date: 02/27/2017 CLINICAL DATA:  Code stroke. Hypertensive, weakness, nausea and vomiting. Confusion and difficulty speaking. History of hypertension, hyperlipidemia, diabetes. EXAM: CT HEAD WITHOUT CONTRAST TECHNIQUE: Contiguous axial images were obtained from the base of the skull through the vertex without intravenous contrast. COMPARISON:  CT HEAD May 20, 2014 FINDINGS: BRAIN: No intraparenchymal hemorrhage, mass effect nor midline shift. The ventricles and sulci are normal for age. Patchy supratentorial white matter hypodensities less than expected for patient's age, though non-specific are most compatible with chronic small vessel ischemic  disease. No acute large vascular territory infarcts. No abnormal extra-axial fluid collections. Basal cisterns are patent. VASCULAR: Dense intracranial vessels most compatible with hemoconcentration. Mild calcific atherosclerosis of the carotid siphons. SKULL: No skull fracture. No significant scalp soft tissue swelling. SINUSES/ORBITS: Mild RIGHT sphenoid mucosal thickening. Mastoid air cells are well aerated. The included ocular globes and orbital contents are non-suspicious. OTHER: Fatty replaced parotid glands. ASPECTS [facility] Stroke Program Early CT Score) - Ganglionic level infarction (caudate, lentiform nuclei, internal capsule, insula, M1-M3 cortex): 7 - Supraganglionic infarction (M4-M6 cortex): 3 Total score (0-10 with 10 being normal): 10 IMPRESSION: 1. Negative CT HEAD for age. Mildly dense intracranial vessels most compatible with hemoconcentration. 2. ASPECTS is 10. Critical Value/emergent results were called by telephone at the time of interpretation on 02/27/2017 at 8:31 pm to Dr. Davonna Belling , who verbally acknowledged these results. Electronically Signed   By: Elon Alas M.D.   On: 02/27/2017 20:31    Time Spent in minutes  27   Kameshia Madruga M.D on 03/01/2017 at 1:58 PM  Between 7am to 7pm - Pager - [phone]  After 7pm go to [email or web address] - password [email or web address]  [facility] -  Office  [phone]      

## 2017-03-02 ENCOUNTER — Ambulatory Visit (INDEPENDENT_AMBULATORY_CARE_PROVIDER_SITE_OTHER): Payer: Medicare Other

## 2017-03-02 ENCOUNTER — Other Ambulatory Visit: Payer: Self-pay | Admitting: *Deleted

## 2017-03-02 DIAGNOSIS — I4891 Unspecified atrial fibrillation: Secondary | ICD-10-CM

## 2017-03-02 DIAGNOSIS — I480 Paroxysmal atrial fibrillation: Secondary | ICD-10-CM | POA: Diagnosis not present

## 2017-03-02 DIAGNOSIS — I10 Essential (primary) hypertension: Secondary | ICD-10-CM

## 2017-03-02 LAB — BASIC METABOLIC PANEL
Anion gap: 8 (ref 5–15)
BUN: 15 mg/dL (ref 6–20)
CALCIUM: 9.1 mg/dL (ref 8.9–10.3)
CO2: 25 mmol/L (ref 22–32)
Chloride: 99 mmol/L — ABNORMAL LOW (ref 101–111)
Creatinine, Ser: 0.83 mg/dL (ref 0.44–1.00)
GFR calc Af Amer: >60 mL/min (ref >60)
GLUCOSE: 128 mg/dL — AB (ref 65–99)
Potassium: 3.7 mmol/L (ref 3.5–5.1)
Sodium: 132 mmol/L — ABNORMAL LOW (ref 135–145)

## 2017-03-02 LAB — GLUCOSE, CAPILLARY
GLUCOSE-CAPILLARY: 114 mg/dL — AB (ref 65–99)
GLUCOSE-CAPILLARY: 88 mg/dL (ref 65–99)

## 2017-03-02 LAB — ANTINUCLEAR ANTIBODIES, IFA: ANA Ab, IFA: NEGATIVE

## 2017-03-02 LAB — VITAMIN B12: Vitamin B-12: 4824 pg/mL — ABNORMAL HIGH (ref 180–914)

## 2017-03-02 LAB — C-REACTIVE PROTEIN

## 2017-03-02 MED ORDER — ATORVASTATIN CALCIUM 40 MG PO TABS: 40.0000 mg | ORAL_TABLET | Freq: Every day | ORAL | 0 refills | Status: DC

## 2017-03-02 MED ORDER — ASPIRIN EC 325 MG PO TBEC: 325.0000 mg | DELAYED_RELEASE_TABLET | Freq: Every day | ORAL | 0 refills | Status: DC

## 2017-03-02 NOTE — Progress Notes (Addendum)
1302 d/c instructions and paperwork given to patient and patient's son. Event cardiac monitor placed on patient by cardiology nurse. IV catheter removed from LEFT FA intact, w/no s/s of infiltration or infection noted. Telemetry wiring removed from patient. Patient's son gathered patient's belongings and patient was assisted to son's vehicle via w/c.

## 2017-03-03 LAB — HOMOCYSTEINE: Homocysteine: 6 umol/L (ref 0.0–15.0)

## 2017-03-03 NOTE — Discharge Summary (Signed)
Physician Discharge Summary  Brittany Goodman WCB:[id number] DOB: Apr 12, 1946 DOA: 02/27/2017  PCP: [facility] date: 02/27/2017 Discharge date: 03/02/2017  Admitted From: Home.  Disposition:  Home  Recommendations for Outpatient Follow-up:  1. Follow up with PCP in 1-2 weeks 2. Please obtain BMP/CBC in one week 3. Please follow up neurology as recommended in 4 to 6 weeks.     Discharge Condition:stable.  CODE STATUS:full code.  Diet recommendation: Heart Healthy   Brief/Interim Summary: [place] a 71 y.o.female,with history of diabetes mellitus hypertension, chronic back pain, Who was brought to the hospital for confusion and difficulty finding words, She was admitted with the running diagnosis of hypertensive crisis however further workup showed small ischemic stroke on MRI.neurology consulted and recommendations given.   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 Much improved due to small left parietal acute infarct on MRI. Currently she is doing much better, placed on aspirin and statin, LDL over 70 A1c is 6.1 Echocardiogram, carotid duplex,  Done. PT, OT, speech and neurology have been consulted. No focal deficits on exam. Repeat CT done today for some word finding difficulty after working with PT.  It was negative for new stroke.  Cardiology consulted for event monitor placement to evaluate for AFIB for cryptogenic stroke. She will need follow up with vasculitis work up as outpatient with neurology.   2. Hypertension. , allow for permissive hypertension. Resume meds on discharge.   3. Dyslipidemia. Placed on statin.  4. DM type II. On sliding scale along with home dose Amaryl. Resume home meds.   CBG (last 3)   Recent Labs (last 2 labs)    Recent Labs  02/28/17 2126  03/01/17 0717 03/01/17 1112  GLUCAP 90 104* 82      Recent Labs       Lab Results  Component Value Date   HGBA1C 6.1 (H) 02/27/2017    better controlled.   Hyponatremia: asymptomatic. Had low sodium levels in 2011 too,  Her sodium levels on discharge have been in 132 to 130.   Discharge Instructions  Discharge Instructions    Ambulatory referral to Physical Therapy    Complete by:  As directed    Diet - low sodium heart healthy    Complete by:  As directed    Discharge instructions    Complete by:  As directed    Follow up with PCP IN ONE WEEK. Please follow up with neurology in 4 to 6 weeks.  Please follow up with event monitor with cardiology.     Allergies as of 03/02/2017      Reactions   Codeine Other (See Comments)   "makes me feel bad"   Dilaudid [hydromorphone Hcl]    hallucination   Morphine    vomiting   Tetracycline    vomiting      Medication List    TAKE these medications   aspirin EC 325 MG tablet Take 1 tablet (325 mg total) by mouth daily.   atenolol 100 MG tablet Commonly known as:  TENORMIN Take 100 mg by mouth daily.   atorvastatin 40 MG tablet Commonly known as:  LIPITOR Take 1 tablet (40 mg total) by mouth daily at 6 PM.   citalopram 20 MG tablet Commonly known as:  CELEXA Take 20 mg by mouth every other day.   cyclobenzaprine 10 MG tablet  Commonly known as:  FLEXERIL Take 10 mg by mouth 2 (two) times daily as needed for muscle spasms.   glimepiride 1 MG tablet Commonly known as:  AMARYL Take 1 mg by mouth daily before breakfast.   levothyroxine 88 MCG tablet Commonly known as:  SYNTHROID, LEVOTHROID Take 88 mcg by mouth daily.   lisinopril 5 MG tablet Commonly known as:  PRINIVIL,ZESTRIL Take 5 mg by mouth daily.   metFORMIN 1000 MG tablet Commonly known as:  GLUCOPHAGE Take 1,000 mg by mouth 2 (two) times daily with a meal.   omeprazole 20 MG capsule Commonly known as:  PRILOSEC Take 20 mg by mouth daily.    TYLENOL 325 MG tablet Generic drug:  acetaminophen Take 325 mg by mouth every 6 (six) hours as needed for pain.   vitamin B-12 1000 MCG tablet Commonly known as:  CYANOCOBALAMIN Take 1,000 mcg by mouth daily.   vitamin C 1000 MG tablet Take 2,000 mg by mouth 2 (two) times daily.   Vitamin D 2000 units Caps Take 1 capsule by mouth daily.      Follow-up Information    [facility]. Schedule an appointment as soon as possible for a visit in 1 week(s).   Specialty:  Family Medicine Why:  post hospitalization visit.  Contact information: [address] [place] [address] [phone]        Phillips Odor, MD. Schedule an appointment as soon as possible for a visit in 4 week(s).   Specialty:  Neurology Why:  for stroke followup  Contact information: [address] [place] [address] [phone]          Allergies  Allergen Reactions  . Codeine Other (See Comments)    "makes me feel bad"  . Dilaudid [Hydromorphone Hcl]     hallucination  . Morphine     vomiting  . Tetracycline     vomiting    Consultations: Neurology.   Procedures/Studies: Ct Head Wo Contrast  Result Date: 03/01/2017 CLINICAL DATA:  Confusion and altered mental status.  Dysarthria. EXAM: CT HEAD WITHOUT CONTRAST TECHNIQUE: Contiguous axial images were obtained from the base of the skull through the vertex without intravenous contrast. COMPARISON:  Head CT February 27, 2017 and brain MRI February 28, 2017 FINDINGS: Brain: Age related volume loss is stable. There is no intracranial mass, hemorrhage, extra-axial fluid collection, or midline shift. The small infarct in the left inferior parietal lobe seen on MR 1 day prior is not appreciable by noncontrast enhanced head CT examination. On this current examination, no gray-white compartments lesions are identified beyond rather minimal small vessel disease in the posterior centra semiovale bilaterally. No new areas of  decreased attenuation evident. Vascular: There is no appreciable hyperdense vessel. There is calcification in each carotid siphon region. Skull: Bony calvarium appears intact. Sinuses/Orbits: There is mucosal thickening in several ethmoid air cells bilaterally. There is opacification right sphenoid sinus with air-fluid level. Other visualized paranasal sinuses are clear. Visualized orbits appear symmetric and normal bilaterally. Other: Mastoid air cells are clear. IMPRESSION: The known small acute infarct in the left parietal lobe is not evident by CT. This infarct was present on MR 1 day prior. There is no new gray-white compartment lesion appreciable by CT. There is no mass, hemorrhage, or extra-axial fluid collection. There is vascular calcification in each carotid siphon region. Areas of paranasal sinus disease noted. There is mucosal thickening in the right sphenoid sinus with an air-fluid level. This finding is  more prominent than on recent CT from 2 days prior. Electronically Signed   By: Lowella Grip III M.D.   On: 03/01/2017 13:01   Mr Jodene Nam Head Wo Contrast  Result Date: 02/28/2017 CLINICAL DATA:  Weakness, confusion, and speech difficulty for 2 days. EXAM: MRI HEAD WITHOUT CONTRAST MRA HEAD WITHOUT CONTRAST TECHNIQUE: Multiplanar, multiecho pulse sequences of the brain and surrounding structures were obtained without intravenous contrast. Angiographic images of the head were obtained using MRA technique without contrast. COMPARISON:  CT head 02/27/2017. FINDINGS: MRI HEAD FINDINGS Brain: Subcentimeter focus of restricted diffusion in the LEFT parietal cortex and subcortical white matter consistent with acute infarction. No hemorrhage, mass lesion, hydrocephalus, or extra-axial fluid. Mild cerebral and cerebellar atrophy. Mild subcortical and periventricular T2 and FLAIR hyperintensities, likely chronic microvascular ischemic change. Vascular: Flow voids are maintained throughout the carotid,  basilar, and vertebral arteries. There are no areas of chronic hemorrhage. Skull and upper cervical spine: Unremarkable visualized calvarium, skullbase, and cervical vertebrae. Partial empty sella. Pineal, cerebellar tonsils unremarkable. No upper cervical cord lesions. Sinuses/Orbits: No orbital masses or proptosis. Globes appear symmetric. Sinuses appear well aerated, without evidence for air-fluid level. Other: None. MRA HEAD FINDINGS The internal carotid arteries are widely patent. The basilar artery is widely patent with RIGHT vertebral dominant. There is no proximal intracranial stenosis or aneurysm. Dolichoectatic vasculature results in artifactual loss of signal in the proximal P1 LEFT PCA segment. It is difficult to exclude intracranial atherosclerotic disease in the ambient P2 and P3 segments bilaterally however. IMPRESSION: Subcentimeter focus of restricted diffusion LEFT parietal cortex and subcortical white matter consistent with acute infarction. This cannot be seen on yesterday's CT. No intracranial hemorrhage. Atrophy and small vessel disease. No proximal intracranial stenosis. Electronically Signed   By: Staci Righter M.D.   On: 02/28/2017 11:13   Mr Brain Wo Contrast  Result Date: 02/28/2017 CLINICAL DATA:  Weakness, confusion, and speech difficulty for 2 days. EXAM: MRI HEAD WITHOUT CONTRAST MRA HEAD WITHOUT CONTRAST TECHNIQUE: Multiplanar, multiecho pulse sequences of the brain and surrounding structures were obtained without intravenous contrast. Angiographic images of the head were obtained using MRA technique without contrast. COMPARISON:  CT head 02/27/2017. FINDINGS: MRI HEAD FINDINGS Brain: Subcentimeter focus of restricted diffusion in the LEFT parietal cortex and subcortical white matter consistent with acute infarction. No hemorrhage, mass lesion, hydrocephalus, or extra-axial fluid. Mild cerebral and cerebellar atrophy. Mild subcortical and periventricular T2 and FLAIR  hyperintensities, likely chronic microvascular ischemic change. Vascular: Flow voids are maintained throughout the carotid, basilar, and vertebral arteries. There are no areas of chronic hemorrhage. Skull and upper cervical spine: Unremarkable visualized calvarium, skullbase, and cervical vertebrae. Partial empty sella. Pineal, cerebellar tonsils unremarkable. No upper cervical cord lesions. Sinuses/Orbits: No orbital masses or proptosis. Globes appear symmetric. Sinuses appear well aerated, without evidence for air-fluid level. Other: None. MRA HEAD FINDINGS The internal carotid arteries are widely patent. The basilar artery is widely patent with RIGHT vertebral dominant. There is no proximal intracranial stenosis or aneurysm. Dolichoectatic vasculature results in artifactual loss of signal in the proximal P1 LEFT PCA segment. It is difficult to exclude intracranial atherosclerotic disease in the ambient P2 and P3 segments bilaterally however. IMPRESSION: Subcentimeter focus of restricted diffusion LEFT parietal cortex and subcortical white matter consistent with acute infarction. This cannot be seen on yesterday's CT. No intracranial hemorrhage. Atrophy and small vessel disease. No proximal intracranial stenosis. Electronically Signed   By: Staci Righter M.D.   On: 02/28/2017 11:13  US Carotid Bilateral  Result Date: 02/28/2017 CLINICAL DATA:  Vertigo with altered mental status.  Dysarthria. EXAM: BILATERAL CAROTID DUPLEX ULTRASOUND TECHNIQUE: Pearline Cables scale imaging, color Doppler and duplex ultrasound were performed of bilateral carotid and vertebral arteries in the neck. COMPARISON:  05/22/2014 FINDINGS: Criteria: Quantification of carotid stenosis is based on velocity parameters that correlate the residual internal carotid diameter with NASCET-based stenosis levels, using the diameter of the distal internal carotid lumen as the denominator for stenosis measurement. The following velocity measurements were  obtained: RIGHT ICA:  110 cm/sec CCA:  63 cm/sec SYSTOLIC ICA/CCA RATIO:  1.7 DIASTOLIC ICA/CCA RATIO:  3.0 ECA:  90 cm/sec LEFT ICA:  66 cm/sec CCA:  65 cm/sec SYSTOLIC ICA/CCA RATIO:  1.0 DIASTOLIC ICA/CCA RATIO:  1.1 ECA:  85 cm/sec RIGHT CAROTID ARTERY: Minimal plaque at the right carotid bulb. External carotid artery is patent with normal waveform. Normal waveforms and velocities in the internal carotid artery. RIGHT VERTEBRAL ARTERY: Antegrade flow and normal waveform in the right vertebral artery. LEFT CAROTID ARTERY: Intimal thickening in the left common carotid artery. External carotid artery is patent with normal waveform. Intimal thickening or minimal plaque in the proximal internal carotid artery. Normal waveforms and velocities in the internal carotid artery. LEFT VERTEBRAL ARTERY: Antegrade flow and normal waveform in the left vertebral artery. IMPRESSION: Minimal plaque in the carotid arteries. No significant carotid artery stenosis. Estimated degree of stenosis in the internal carotid arteries is less than 50% bilaterally. Patent vertebral arteries with antegrade flow. Electronically Signed   By: Markus Daft M.D.   On: 02/28/2017 16:25   Dg Chest Port 1 View  Result Date: 02/28/2017 CLINICAL DATA:  Shortness of breath EXAM: PORTABLE CHEST 1 VIEW COMPARISON:  Chest radiograph 04/22/2015 FINDINGS: The heart size and mediastinal contours are within normal limits. Both lungs are clear. The visualized skeletal structures are unremarkable. IMPRESSION: No active disease. Electronically Signed   By: Ulyses Jarred M.D.   On: 02/28/2017 06:51   Ct Head Code Stroke W/o Cm  Result Date: 02/27/2017 CLINICAL DATA:  Code stroke. Hypertensive, weakness, nausea and vomiting. Confusion and difficulty speaking. History of hypertension, hyperlipidemia, diabetes. EXAM: CT HEAD WITHOUT CONTRAST TECHNIQUE: Contiguous axial images were obtained from the base of the skull through the vertex without intravenous  contrast. COMPARISON:  CT HEAD May 20, 2014 FINDINGS: BRAIN: No intraparenchymal hemorrhage, mass effect nor midline shift. The ventricles and sulci are normal for age. Patchy supratentorial white matter hypodensities less than expected for patient's age, though non-specific are most compatible with chronic small vessel ischemic disease. No acute large vascular territory infarcts. No abnormal extra-axial fluid collections. Basal cisterns are patent. VASCULAR: Dense intracranial vessels most compatible with hemoconcentration. Mild calcific atherosclerosis of the carotid siphons. SKULL: No skull fracture. No significant scalp soft tissue swelling. SINUSES/ORBITS: Mild RIGHT sphenoid mucosal thickening. Mastoid air cells are well aerated. The included ocular globes and orbital contents are non-suspicious. OTHER: Fatty replaced parotid glands. ASPECTS [facility] Stroke Program Early CT Score) - Ganglionic level infarction (caudate, lentiform nuclei, internal capsule, insula, M1-M3 cortex): 7 - Supraganglionic infarction (M4-M6 cortex): 3 Total score (0-10 with 10 being normal): 10 IMPRESSION: 1. Negative CT HEAD for age. Mildly dense intracranial vessels most compatible with hemoconcentration. 2. ASPECTS is 10. Critical Value/emergent results were called by telephone at the time of interpretation on 02/27/2017 at 8:31 pm to Dr. Davonna Belling , who verbally acknowledged these results. Electronically Signed   By: Elon Alas M.D.   On: 02/27/2017  20:31     Subjective: No new complaints. Looking forward to going home.   Discharge Exam: Vitals:   03/02/17 1100 03/02/17 1200  BP: (!) 154/92 (!) 156/88  Pulse: (!) 58 64  Resp: 15 16  Temp:  97.9 F (36.6 C)   Vitals:   03/02/17 0900 03/02/17 1000 03/02/17 1100 03/02/17 1200  BP: (!) 155/89 (!) 167/88 (!) 154/92 (!) 156/88  Pulse: 70 63 (!) 58 64  Resp: 14 13 15 16   Temp:    97.9 F (36.6 C)  TempSrc:    Oral  SpO2: 98% 97% 95% 98%  Weight:       Height:        General: Pt is alert, awake, not in acute distress Cardiovascular: RRR, S1/S2 +, no rubs, no gallops Respiratory: CTA bilaterally, no wheezing, no rhonchi Abdominal: Soft, NT, ND, bowel sounds + Extremities: no edema, no cyanosis    The results of significant diagnostics from this hospitalization (including imaging, microbiology, ancillary and laboratory) are listed below for reference.     Microbiology: Recent Results (from the past 240 hour(s))  MRSA PCR Screening     Status: None   Collection Time: 02/27/17  2:00 AM  Result Value Ref Range Status   MRSA by PCR NEGATIVE NEGATIVE Final    Comment:        The GeneXpert MRSA Assay (FDA approved for NASAL specimens only), is one component of a comprehensive MRSA colonization surveillance program. It is not intended to diagnose MRSA infection nor to guide or monitor treatment for MRSA infections.      Labs: BNP (last 3 results) No results for input(s): BNP in the last 8760 hours. Basic Metabolic Panel:  Recent Labs Lab 02/27/17 2012 02/27/17 2036 02/28/17 0411 03/01/17 0512 03/01/17 1119 03/02/17 0935  NA 133* 134* 130* 127* 129* 132*  K 3.8 3.9 4.1 3.8  --  3.7  CL 98* 97* 93* 94*  --  99*  CO2 28  --  27 25  --  25  GLUCOSE 91 88 132* 106*  --  128*  BUN 14 14 12 12   --  15  CREATININE 0.89 1.00 0.73 0.73  --  0.83  CALCIUM 9.3  --  9.0 8.8*  --  9.1   Liver Function Tests:  Recent Labs Lab 02/27/17 2012 02/28/17 0411  AST 17 18  ALT 15 14  ALKPHOS 52 52  BILITOT 0.3 0.4  PROT 7.7 7.2  ALBUMIN 4.3 4.0   No results for input(s): LIPASE, AMYLASE in the last 168 hours. No results for input(s): AMMONIA in the last 168 hours. CBC:  Recent Labs Lab 02/27/17 2012 02/27/17 2036 02/28/17 0411  WBC 9.9  --  11.2*  NEUTROABS 5.2  --   --   HGB 13.7 14.6 13.3  HCT 40.8 43.0 41.0  MCV 88.1  --  88.2  PLT 276  --  284   Cardiac Enzymes: No results for input(s): CKTOTAL, CKMB,  CKMBINDEX, TROPONINI in the last 168 hours. BNP: Invalid input(s): POCBNP CBG:  Recent Labs Lab 03/01/17 1112 03/01/17 1635 03/01/17 2059 03/02/17 0719 03/02/17 1117  GLUCAP 82 102* 113* 114* 88   D-Dimer No results for input(s): DDIMER in the last 72 hours. Hgb A1c No results for input(s): HGBA1C in the last 72 hours. Lipid Profile No results for input(s): CHOL, HDL, LDLCALC, TRIG, CHOLHDL, LDLDIRECT in the last 72 hours. Thyroid function studies No results for input(s): TSH, T4TOTAL, T3FREE, THYROIDAB in  the last 72 hours.  Invalid input(s): FREET3 Anemia work up  Recent Labs  03/01/17 0512  VITAMINB12 4,824*   Urinalysis    Component Value Date/Time   COLORURINE STRAW (A) 02/27/2017 2220   APPEARANCEUR CLEAR 02/27/2017 2220   LABSPEC 1.006 02/27/2017 2220   PHURINE 7.0 02/27/2017 2220   GLUCOSEU NEGATIVE 02/27/2017 2220   HGBUR NEGATIVE 02/27/2017 2220   BILIRUBINUR NEGATIVE 02/27/2017 2220   KETONESUR NEGATIVE 02/27/2017 2220   PROTEINUR NEGATIVE 02/27/2017 2220   UROBILINOGEN 0.2 05/27/2010 0005   NITRITE NEGATIVE 02/27/2017 2220   LEUKOCYTESUR SMALL (A) 02/27/2017 2220   Sepsis Labs Invalid input(s): PROCALCITONIN,  WBC,  LACTICIDVEN Microbiology Recent Results (from the past 240 hour(s))  MRSA PCR Screening     Status: None   Collection Time: 02/27/17  2:00 AM  Result Value Ref Range Status   MRSA by PCR NEGATIVE NEGATIVE Final    Comment:        The GeneXpert MRSA Assay (FDA approved for NASAL specimens only), is one component of a comprehensive MRSA colonization surveillance program. It is not intended to diagnose MRSA infection nor to guide or monitor treatment for MRSA infections.      Time coordinating discharge: Over 30 minutes  SIGNED:   Hosie Poisson, MD  [facility] 03/03/2017, 9:48 AM Pager   If 7PM-7AM, please contact night-coverage [email or web address] Password [email or web address]

## 2017-03-10 DIAGNOSIS — E871 Hypo-osmolality and hyponatremia: Secondary | ICD-10-CM | POA: Diagnosis not present

## 2017-03-10 DIAGNOSIS — Z6829 Body mass index (BMI) 29.0-29.9, adult: Secondary | ICD-10-CM | POA: Diagnosis not present

## 2017-03-10 DIAGNOSIS — Z1389 Encounter for screening for other disorder: Secondary | ICD-10-CM | POA: Diagnosis not present

## 2017-03-10 DIAGNOSIS — I639 Cerebral infarction, unspecified: Secondary | ICD-10-CM | POA: Diagnosis not present

## 2017-03-10 NOTE — Progress Notes (Signed)
CODE STROKE 0805 CALL TIME 0806 BEEPER TIME 0810 EXAM STARTED 0815 EXAM FINISHED 0815 IMAGES SENT TO SOC 0822 EXAM COMPLETED IN EPIC [phone] [facility] CALLED

## 2017-04-19 ENCOUNTER — Encounter: Payer: Self-pay | Admitting: Neurology

## 2017-04-19 ENCOUNTER — Encounter (INDEPENDENT_AMBULATORY_CARE_PROVIDER_SITE_OTHER): Payer: Self-pay

## 2017-04-19 ENCOUNTER — Ambulatory Visit (INDEPENDENT_AMBULATORY_CARE_PROVIDER_SITE_OTHER): Payer: Medicare Other | Admitting: Neurology

## 2017-04-19 VITALS — BP 183/114 | HR 71 | Ht 64.0 in | Wt 175.0 lb

## 2017-04-19 DIAGNOSIS — I679 Cerebrovascular disease, unspecified: Secondary | ICD-10-CM | POA: Diagnosis not present

## 2017-04-19 NOTE — Patient Instructions (Addendum)
Stroke Prevention Some medical conditions and behaviors are associated with an increased chance of having a stroke. You may prevent a stroke by making healthy choices and managing medical conditions. How can I reduce my risk of having a stroke?  Stay physically active. Get at least 30 minutes of activity on most or all days.  Do not smoke. It may also be helpful to avoid exposure to secondhand smoke.  Limit alcohol use. Moderate alcohol use is considered to be:  No more than 2 drinks per day for men.  No more than 1 drink per day for nonpregnant women.  Eat healthy foods. This involves:  Eating 5 or more servings of fruits and vegetables a day.  Making dietary changes that address high blood pressure (hypertension), high cholesterol, diabetes, or obesity.  Manage your cholesterol levels.  Making food choices that are high in fiber and low in saturated fat, trans fat, and cholesterol may control cholesterol levels.  Take any prescribed medicines to control cholesterol as directed by your health care provider.  Manage your diabetes.  Controlling your carbohydrate and sugar intake is recommended to manage diabetes.  Take any prescribed medicines to control diabetes as directed by your health care provider.  Control your hypertension.  Making food choices that are low in salt (sodium), saturated fat, trans fat, and cholesterol is recommended to manage hypertension.  Ask your health care provider if you need treatment to lower your blood pressure. Take any prescribed medicines to control hypertension as directed by your health care provider.  If you are 29-34 years of age, have your blood pressure checked every 3-5 years. If you are 10 years of age or older, have your blood pressure checked every year.  Maintain a healthy weight.  Reducing calorie intake and making food choices that are low in sodium, saturated fat, trans fat, and cholesterol are recommended to manage  weight.  Stop drug abuse.  Avoid taking birth control pills.  Talk to your health care provider about the risks of taking birth control pills if you are over 19 years old, smoke, get migraines, or have ever had a blood clot.  Get evaluated for sleep disorders (sleep apnea).  Talk to your health care provider about getting a sleep evaluation if you snore a lot or have excessive sleepiness.  Take medicines only as directed by your health care provider.  For some people, aspirin or blood thinners (anticoagulants) are helpful in reducing the risk of forming abnormal blood clots that can lead to stroke. If you have the irregular heart rhythm of atrial fibrillation, you should be on a blood thinner unless there is a good reason you cannot take them.  Understand all your medicine instructions.  Make sure that other conditions (such as anemia or atherosclerosis) are addressed. Get help right away if:  You have sudden weakness or numbness of the face, arm, or leg, especially on one side of the body.  Your face or eyelid droops to one side.  You have sudden confusion.  You have trouble speaking (aphasia) or understanding.  You have sudden trouble seeing in one or both eyes.  You have sudden trouble walking.  You have dizziness.  You have a loss of balance or coordination.  You have a sudden, severe headache with no known cause.  You have new chest pain or an irregular heartbeat. Any of these symptoms may represent a serious problem that is an emergency. Do not wait to see if the symptoms will go away.  Get medical help at once. Call your local emergency services (911 in [place] Do not drive yourself to the hospital. This information is not intended to replace advice given to you by your health care provider. Make sure you discuss any questions you have with your health care provider. Document Released: 12/15/2004 Document Revised: 04/14/2016 Document Reviewed: 05/10/2013 Elsevier  Interactive Patient Education  2017 [employer].

## 2017-04-19 NOTE — Progress Notes (Signed)
Reason for visit: Stroke  Referring physician: Dr. Azell Der is a 71 y.o. female  History of present illness:  Brittany Goodman is a 71 year old right-handed white female with a history of diabetes and hypertension. The patient presented to the hospital on 02/27/2017 with onset of aphasia. The patient had no real sensation that she was speaking in a nonsensical fashion, she was able to understand what people were asking her. The patient had no numbness or weakness of extremities, no gait imbalance, and she recalls no problems with vision. The patient was taken to the hospital and admitted for an evaluation. A workup showed evidence of a small cortical stroke in the left parietal area, MRA of the head was unremarkable. A carotid Doppler study did not show significant carotid stenosis, and a 2-D echocardiogram was unremarkable. The patient has been set up for a 30 day cardiac monitor study, the results of this are not yet available. The patient was able to resolve the speech deficit within the first 24 hours, but she was still somewhat slow to respond for 2 or 3 days after the stroke event. She believes that she has returned back to her usual baseline without any deficits. She was not on aspirin prior to the stroke event, she is now taking daily aspirin. Her blood pressures were significantly elevated at the time of the stroke.  Past Medical History:  Diagnosis Date  . Arthritis   . Depression   . Diabetes mellitus type II   . Diverticulosis   . GERD (gastroesophageal reflux disease)   . Hyperlipidemia   . Hypertension   . Hypothyroidism   . Sepsis ([place])    2011, Escherichia coli pyelonephritis  . Spinal stenosis     Past Surgical History:  Procedure Laterality Date  . BACK SURGERY    . CARPAL TUNNEL RELEASE    . COLONOSCOPY N/A 05/29/2013   Procedure: COLONOSCOPY;  Surgeon: Daneil Dolin, MD;  [facility]: [facility];  Service: Endoscopy;  Laterality: N/A;  12:45-moved to  11:15 Brittany Goodman notified pt  . ESOPHAGOGASTRODUODENOSCOPY (EGD) WITH ESOPHAGEAL DILATION N/A 05/29/2013   Procedure: ESOPHAGOGASTRODUODENOSCOPY (EGD) WITH ESOPHAGEAL DILATION;  Surgeon: Daneil Dolin, MD;  Location: [facility];  Service: Endoscopy;  Laterality: N/A;  . ILIOTIBIAL BAND RELEASE    . TOTAL ABDOMINAL HYSTERECTOMY      Family History  Problem Relation Age of Onset  . Heart disease Father   . Heart attack Father 58  . Heart disease Brother   . Colon polyps Brother   . Colon cancer Neg Hx     Social history:  reports that she has quit smoking. She has quit using smokeless tobacco. Her smokeless tobacco use included Chew. She reports that she does not drink alcohol or use drugs.  Medications:  Prior to Admission medications   Medication Sig Start Date End Date Taking? Authorizing Provider  acetaminophen (TYLENOL) 325 MG tablet Take 325 mg by mouth every 6 (six) hours as needed for pain.    Yes [provider]  Ascorbic Acid (VITAMIN C) 1000 MG tablet Take 2,000 mg by mouth 2 (two) times daily.    Yes [provider]  aspirin EC 325 MG tablet Take 1 tablet (325 mg total) by mouth daily. 03/03/17  Yes Hosie Poisson, MD  atenolol (TENORMIN) 100 MG tablet Take 100 mg by mouth daily.     Yes [provider]  Cholecalciferol (VITAMIN D) 2000 units CAPS Take 1  capsule by mouth daily.   Yes [provider]  citalopram (CELEXA) 20 MG tablet Take 20 mg by mouth every other day.    Yes [provider]  cyclobenzaprine (FLEXERIL) 10 MG tablet Take 10 mg by mouth 2 (two) times daily as needed for muscle spasms.    Yes [provider]  fenofibrate 160 MG tablet Take 160 mg by mouth daily. 03/30/17  Yes [provider]  glimepiride (AMARYL) 1 MG tablet Take 1 mg by mouth daily before breakfast.  04/23/13  Yes [provider]  HYDROcodone-acetaminophen (NORCO/VICODIN) 5-325 MG tablet TAKE 1 TABLET BY MOUTH EVERY 4-6 HOURS AS  NEEDED 03/10/17  Yes [provider]  levothyroxine (SYNTHROID, LEVOTHROID) 88 MCG tablet Take 88 mcg by mouth daily. 01/18/17  Yes [provider]  lisinopril (PRINIVIL,ZESTRIL) 5 MG tablet Take 5 mg by mouth daily.  03/09/13  Yes [provider]  metFORMIN (GLUCOPHAGE) 1000 MG tablet Take 1,000 mg by mouth 2 (two) times daily with a meal.  12/01/16  Yes [provider]  omeprazole (PRILOSEC) 20 MG capsule Take 20 mg by mouth daily.    Yes [provider]  vitamin B-12 (CYANOCOBALAMIN) 1000 MCG tablet Take 1,000 mcg by mouth daily.   Yes [provider]      Allergies  Allergen Reactions  . Codeine Other (See Comments)    "makes me feel bad"  . Dilaudid [Hydromorphone Hcl]     hallucination  . Morphine     vomiting  . Tetracycline     vomiting    ROS:  Out of a complete 14 system review of symptoms, the patient complains only of the following symptoms, and all other reviewed systems are negative.  Fatigue Joint pain, joint swelling, muscle cramps, aching muscles Allergies Depression, not enough sleep Restless legs  Blood pressure (!) 183/114, pulse 71, height 5' 4"  (1.626 m), weight 175 lb (79.4 kg).  Physical Exam  General: The patient is alert and cooperative at the time of the examination.  Eyes: Pupils are equal, round, and reactive to light. Discs are flat bilaterally.  Neck: The neck is supple, no carotid bruits are noted.  Respiratory: The respiratory examination is clear.  Cardiovascular: The cardiovascular examination reveals a regular rate and rhythm, no obvious murmurs or rubs are noted.  Skin: Extremities are without significant edema.  Neurologic Exam  Mental status: The patient is alert and oriented x 3 at the time of the examination. The patient has apparent normal recent and remote memory, with an apparently normal attention span and concentration ability.  Cranial nerves: Facial symmetry is present.  There is good sensation of the face to pinprick and soft touch bilaterally. The strength of the facial muscles and the muscles to head turning and shoulder shrug are normal bilaterally. Speech is well enunciated, no aphasia or dysarthria is noted. Extraocular movements are full. Visual fields are full. The tongue is midline, and the patient has symmetric elevation of the soft palate. No obvious hearing deficits are noted.  Motor: The motor testing reveals 5 over 5 strength of all 4 extremities. Good symmetric motor tone is noted throughout.  Sensory: Sensory testing is intact to pinprick, soft touch, vibration sensation, and position sense on all 4 extremities. No evidence of extinction is noted.  Coordination: Cerebellar testing reveals good finger-nose-finger and heel-to-shin bilaterally.  Gait and station: Gait is normal. Tandem gait is slightly unsteady. Romberg is negative. No drift is seen.  Reflexes: Deep tendon reflexes  are symmetric and normal bilaterally. Toes are downgoing bilaterally.   MRI brain and MRA head 02/28/17:  IMPRESSION: Subcentimeter focus of restricted diffusion LEFT parietal cortex and subcortical white matter consistent with acute infarction. This cannot be seen on yesterday's CT.  No intracranial hemorrhage.  Atrophy and small vessel disease.  No proximal intracranial stenosis.  * MRI scan images were reviewed online. I agree with the written report.    Carotid doppler 02/28/17:  IMPRESSION: Minimal plaque in the carotid arteries. No significant carotid artery stenosis. Estimated degree of stenosis in the internal carotid arteries is less than 50% bilaterally.  Patent vertebral arteries with antegrade flow.   2D echo 02/28/17:  Study Conclusions  - Left ventricle: The cavity size was normal. Wall thickness was   increased in a pattern of mild LVH. Systolic function was normal.   The estimated ejection fraction was in the range of 60% to  65%.   Wall motion was normal; there were no regional wall motion   abnormalities. Doppler parameters are consistent with abnormal   left ventricular relaxation (grade 1 diastolic dysfunction). - Aortic valve: Moderately calcified annulus. Trileaflet. - Mitral valve: Mildly calcified annulus.    Assessment/Plan:  1. Left parietal stroke event  The patient has had a good stroke evaluation. A 30 day cardiac monitor study has been done, unless this shows evidence of atrial fibrillation, the patient will remain on antiplatelet agent therapy such as aspirin for now. I have spent some time going over risk factors of stroke with the patient, she is to check blood pressures at home, drink plenty of fluids, reduce salt in the diet, begin an exercise program, and carefully monitor her diabetes and cholesterol. The patient will follow-up through this office on an as-needed basis. The patient currently has no deficits from the small stroke event.   Jill Alexanders MD 04/19/2017 11:43 AM  [facility] [address] [place], [place] [address]  Phone [phone] Fax [phone]

## 2017-05-25 DIAGNOSIS — Z6830 Body mass index (BMI) 30.0-30.9, adult: Secondary | ICD-10-CM | POA: Diagnosis not present

## 2017-05-25 DIAGNOSIS — Z0000 Encounter for general adult medical examination without abnormal findings: Secondary | ICD-10-CM | POA: Diagnosis not present

## 2017-05-25 DIAGNOSIS — E6609 Other obesity due to excess calories: Secondary | ICD-10-CM | POA: Diagnosis not present

## 2017-06-19 DIAGNOSIS — E119 Type 2 diabetes mellitus without complications: Secondary | ICD-10-CM | POA: Diagnosis not present

## 2017-06-19 DIAGNOSIS — Z1389 Encounter for screening for other disorder: Secondary | ICD-10-CM | POA: Diagnosis not present

## 2017-06-19 DIAGNOSIS — E6609 Other obesity due to excess calories: Secondary | ICD-10-CM | POA: Diagnosis not present

## 2017-06-19 DIAGNOSIS — I10 Essential (primary) hypertension: Secondary | ICD-10-CM | POA: Diagnosis not present

## 2017-06-19 DIAGNOSIS — Z6830 Body mass index (BMI) 30.0-30.9, adult: Secondary | ICD-10-CM | POA: Diagnosis not present

## 2017-06-19 DIAGNOSIS — G8929 Other chronic pain: Secondary | ICD-10-CM | POA: Diagnosis not present

## 2017-06-22 ENCOUNTER — Other Ambulatory Visit (HOSPITAL_COMMUNITY): Payer: Self-pay | Admitting: Family Medicine

## 2017-06-22 DIAGNOSIS — Z1231 Encounter for screening mammogram for malignant neoplasm of breast: Secondary | ICD-10-CM

## 2017-06-29 ENCOUNTER — Ambulatory Visit (HOSPITAL_COMMUNITY)
Admission: RE | Admit: 2017-06-29 | Discharge: 2017-06-29 | Disposition: A | Discharge status: Home / Self Care | Payer: Medicare Other | Source: Ambulatory Visit | Attending: Family Medicine | Admitting: Family Medicine

## 2017-06-29 DIAGNOSIS — Z1231 Encounter for screening mammogram for malignant neoplasm of breast: Secondary | ICD-10-CM | POA: Insufficient documentation

## 2017-07-20 ENCOUNTER — Ambulatory Visit (INDEPENDENT_AMBULATORY_CARE_PROVIDER_SITE_OTHER): Payer: Medicare Other | Admitting: Cardiology

## 2017-07-20 ENCOUNTER — Encounter: Payer: Self-pay | Admitting: Cardiology

## 2017-07-20 VITALS — BP 142/90 | HR 67 | Ht 64.0 in | Wt 186.0 lb

## 2017-07-20 DIAGNOSIS — E119 Type 2 diabetes mellitus without complications: Secondary | ICD-10-CM | POA: Diagnosis not present

## 2017-07-20 DIAGNOSIS — E782 Mixed hyperlipidemia: Secondary | ICD-10-CM | POA: Diagnosis not present

## 2017-07-20 DIAGNOSIS — Z8673 Personal history of transient ischemic attack (TIA), and cerebral infarction without residual deficits: Secondary | ICD-10-CM

## 2017-07-20 DIAGNOSIS — I10 Essential (primary) hypertension: Secondary | ICD-10-CM

## 2017-07-20 NOTE — Progress Notes (Signed)
Clinical Summary Brittany Goodman is a 71 y.o.female seen today as a new consult, she is self  referred for her history of CVA.   1. CVA - followed by neurology - had 30 day event monitor placed which showed no arrhtyhmias - she is on ASA, ACE-I for secondary prevention. Intolerant to statins  2. HTN - compliant with meds - checks bp daily, typically around 116/66  3. DM2 - followed by pcp  4. Hyperlipidemia - did not tolerate statins - tried repatha, caused back pain.  - tried on fenofibrate with back aches.   Past Medical History:  Diagnosis Date  . Arthritis   . Depression   . Diabetes mellitus type II   . Diverticulosis   . GERD (gastroesophageal reflux disease)   . Hyperlipidemia   . Hypertension   . Hypothyroidism   . Sepsis ([place])    2011, Escherichia coli pyelonephritis  . Spinal stenosis      Allergies  Allergen Reactions  . Codeine Other (See Comments)    "makes me feel bad"  . Dilaudid [Hydromorphone Hcl]     hallucination  . Morphine     vomiting  . Tetracycline     vomiting     Current Outpatient Prescriptions  Medication Sig Dispense Refill  . acetaminophen (TYLENOL) 325 MG tablet Take 325 mg by mouth every 6 (six) hours as needed for pain.     . Ascorbic Acid (VITAMIN C) 1000 MG tablet Take 2,000 mg by mouth 2 (two) times daily.     Marland Kitchen aspirin EC 325 MG tablet Take 1 tablet (325 mg total) by mouth daily. 30 tablet 0  . atenolol (TENORMIN) 100 MG tablet Take 100 mg by mouth daily.      . Cholecalciferol (VITAMIN D) 2000 units CAPS Take 1 capsule by mouth daily.    . citalopram (CELEXA) 20 MG tablet Take 20 mg by mouth every other day.     . cyclobenzaprine (FLEXERIL) 10 MG tablet Take 10 mg by mouth 2 (two) times daily as needed for muscle spasms.     . fenofibrate 160 MG tablet Take 160 mg by mouth daily.    Marland Kitchen glimepiride (AMARYL) 1 MG tablet Take 1 mg by mouth daily before breakfast.     . HYDROcodone-acetaminophen (NORCO/VICODIN) 5-325 MG  tablet TAKE 1 TABLET BY MOUTH EVERY 4-6 HOURS AS NEEDED  0  . levothyroxine (SYNTHROID, LEVOTHROID) 88 MCG tablet Take 88 mcg by mouth daily.    Marland Kitchen lisinopril (PRINIVIL,ZESTRIL) 5 MG tablet Take 5 mg by mouth daily.     . metFORMIN (GLUCOPHAGE) 1000 MG tablet Take 1,000 mg by mouth 2 (two) times daily with a meal.     . omeprazole (PRILOSEC) 20 MG capsule Take 20 mg by mouth daily.     . vitamin B-12 (CYANOCOBALAMIN) 1000 MCG tablet Take 1,000 mcg by mouth daily.     No current facility-administered medications for this visit.      Past Surgical History:  Procedure Laterality Date  . BACK SURGERY    . CARPAL TUNNEL RELEASE    . COLONOSCOPY N/A 05/29/2013   Procedure: COLONOSCOPY;  Surgeon: Daneil Dolin, MD;  [facility];  Service: Endoscopy;  Laterality: N/A;  12:45-moved to 11:15 [name] notified pt  . ESOPHAGOGASTRODUODENOSCOPY (EGD) WITH ESOPHAGEAL DILATION N/A 05/29/2013   Procedure: ESOPHAGOGASTRODUODENOSCOPY (EGD) WITH ESOPHAGEAL DILATION;  Surgeon: Daneil Dolin, MD;  [facility];  Service: Endoscopy;  Laterality: N/A;  .  ILIOTIBIAL BAND RELEASE    . TOTAL ABDOMINAL HYSTERECTOMY       Allergies  Allergen Reactions  . Codeine Other (See Comments)    "makes me feel bad"  . Dilaudid [Hydromorphone Hcl]     hallucination  . Morphine     vomiting  . Tetracycline     vomiting      Family History  Problem Relation Age of Onset  . Heart disease Father   . Heart attack Father 23  . Heart disease Brother   . Colon polyps Brother   . Colon cancer Neg Hx      Social History Ms. Gregory reports that she has quit smoking. She has quit using smokeless tobacco. Her smokeless tobacco use included Chew. Ms. Hogston reports that she does not drink alcohol.   Review of Systems CONSTITUTIONAL: No weight loss, fever, chills, weakness or fatigue.  HEENT: Eyes: No visual loss, blurred vision, double vision or yellow sclerae.No hearing loss, sneezing,  congestion, runny nose or sore throat.  SKIN: No rash or itching.  CARDIOVASCULAR: per hpi RESPIRATORY: No shortness of breath, cough or sputum.  GASTROINTESTINAL: No anorexia, nausea, vomiting or diarrhea. No abdominal pain or blood.  GENITOURINARY: No burning on urination, no polyuria NEUROLOGICAL: No headache, dizziness, syncope, paralysis, ataxia, numbness or tingling in the extremities. No change in bowel or bladder control.  MUSCULOSKELETAL: No muscle, back pain, joint pain or stiffness.  LYMPHATICS: No enlarged nodes. No history of splenectomy.  PSYCHIATRIC: No history of depression or anxiety.  ENDOCRINOLOGIC: No reports of sweating, cold or heat intolerance. No polyuria or polydipsia.  Marland Kitchen   Physical Examination Vitals:   07/20/17 1005 07/20/17 1008  BP: (!) 142/98 (!) 142/90  Pulse: 67   SpO2: 98%    Vitals:   07/20/17 1005  Weight: 186 lb (84.4 kg)  Height: 5' 4"  (1.626 m)    Gen: resting comfortably, no acute distress HEENT: no scleral icterus, pupils equal round and reactive, no palptable cervical adenopathy,  CV:RRR, no m/rg, no jvd Resp: Clear to auscultation bilaterally GI: abdomen is soft, non-tender, non-distended, normal bowel sounds, no hepatosplenomegaly MSK: extremities are warm, no edema.  Skin: warm, no rash Neuro:  no focal deficits Psych: appropriate affect   Diagnostic Studies 02/2017 event monitor  Telemetry tracings show sinus rhythm  Reported symptoms correlate with normal sinus rhythm  No significant arrhythmias   02/2017 carotid US IMPRESSION: Minimal plaque in the carotid arteries. No significant carotid artery stenosis. Estimated degree of stenosis in the internal carotid arteries is less than 50% bilaterally.  Patent vertebral arteries with antegrade flow.   02/2017 echo Study Conclusions  - Left ventricle: The cavity size was normal. Wall thickness was   increased in a pattern of mild LVH. Systolic function was normal.    The estimated ejection fraction was in the range of 60% to 65%.   Wall motion was normal; there were no regional wall motion   abnormalities. Doppler parameters are consistent with abnormal   left ventricular relaxation (grade 1 diastolic dysfunction). - Aortic valve: Moderately calcified annulus. Trileaflet. - Mitral valve: Mildly calcified annulus.    Assessment and Plan  1. History of CVA - no evidence of cardiac source - continue secondary prevention  2. HTN - at goal, continue current meds  3. DM2 - manaement per pcp - from cardiac standpoint she is on ASA, ACE-I. No statin due to intolerance  4. Hyperlipidemia - intolerant to statins, unclear if she has been tried  on zetia. Defer further management to pcp who is more familarl with her exact history         Arnoldo Lenis, M.D.

## 2017-07-20 NOTE — Patient Instructions (Signed)

## 2017-08-18 DIAGNOSIS — M48061 Spinal stenosis, lumbar region without neurogenic claudication: Secondary | ICD-10-CM | POA: Diagnosis not present

## 2017-08-18 DIAGNOSIS — M5136 Other intervertebral disc degeneration, lumbar region: Secondary | ICD-10-CM | POA: Diagnosis not present

## 2017-10-16 DIAGNOSIS — Z23 Encounter for immunization: Secondary | ICD-10-CM | POA: Diagnosis not present

## 2017-11-07 ENCOUNTER — Ambulatory Visit (HOSPITAL_COMMUNITY)
Admission: RE | Admit: 2017-11-07 | Discharge: 2017-11-07 | Disposition: A | Discharge status: Home / Self Care | Payer: Medicare Other | Source: Ambulatory Visit | Attending: Family Medicine | Admitting: Family Medicine

## 2017-11-07 ENCOUNTER — Other Ambulatory Visit (HOSPITAL_COMMUNITY): Payer: Self-pay | Admitting: Family Medicine

## 2017-11-07 DIAGNOSIS — Z6831 Body mass index (BMI) 31.0-31.9, adult: Secondary | ICD-10-CM | POA: Diagnosis not present

## 2017-11-07 DIAGNOSIS — M13811 Other specified arthritis, right shoulder: Secondary | ICD-10-CM

## 2017-11-07 DIAGNOSIS — Z1389 Encounter for screening for other disorder: Secondary | ICD-10-CM | POA: Insufficient documentation

## 2017-11-07 DIAGNOSIS — I10 Essential (primary) hypertension: Secondary | ICD-10-CM | POA: Diagnosis not present

## 2017-11-07 DIAGNOSIS — E6609 Other obesity due to excess calories: Secondary | ICD-10-CM | POA: Insufficient documentation

## 2017-11-07 DIAGNOSIS — M1990 Unspecified osteoarthritis, unspecified site: Secondary | ICD-10-CM | POA: Diagnosis not present

## 2017-11-07 DIAGNOSIS — E782 Mixed hyperlipidemia: Secondary | ICD-10-CM | POA: Diagnosis not present

## 2017-11-07 DIAGNOSIS — E119 Type 2 diabetes mellitus without complications: Secondary | ICD-10-CM | POA: Diagnosis not present

## 2017-11-07 DIAGNOSIS — M19011 Primary osteoarthritis, right shoulder: Secondary | ICD-10-CM | POA: Diagnosis not present

## 2017-11-07 DIAGNOSIS — I639 Cerebral infarction, unspecified: Secondary | ICD-10-CM | POA: Diagnosis not present

## 2017-11-17 DIAGNOSIS — Z6831 Body mass index (BMI) 31.0-31.9, adult: Secondary | ICD-10-CM | POA: Diagnosis not present

## 2017-11-17 DIAGNOSIS — M25511 Pain in right shoulder: Secondary | ICD-10-CM | POA: Diagnosis not present

## 2017-11-17 DIAGNOSIS — E6609 Other obesity due to excess calories: Secondary | ICD-10-CM | POA: Diagnosis not present

## 2017-12-15 DIAGNOSIS — M19011 Primary osteoarthritis, right shoulder: Secondary | ICD-10-CM | POA: Diagnosis not present

## 2018-01-03 DIAGNOSIS — M19011 Primary osteoarthritis, right shoulder: Secondary | ICD-10-CM | POA: Diagnosis not present

## 2018-01-22 DIAGNOSIS — R1012 Left upper quadrant pain: Secondary | ICD-10-CM | POA: Diagnosis not present

## 2018-01-22 DIAGNOSIS — E6609 Other obesity due to excess calories: Secondary | ICD-10-CM | POA: Diagnosis not present

## 2018-01-22 DIAGNOSIS — Z6830 Body mass index (BMI) 30.0-30.9, adult: Secondary | ICD-10-CM | POA: Diagnosis not present

## 2018-03-16 ENCOUNTER — Other Ambulatory Visit (HOSPITAL_COMMUNITY): Payer: Self-pay | Admitting: Orthopedic Surgery

## 2018-03-16 DIAGNOSIS — M25511 Pain in right shoulder: Secondary | ICD-10-CM

## 2018-03-20 ENCOUNTER — Ambulatory Visit (HOSPITAL_COMMUNITY)
Admission: RE | Admit: 2018-03-20 | Discharge: 2018-03-20 | Disposition: A | Discharge status: Home / Self Care | Payer: Medicare Other | Source: Ambulatory Visit | Attending: Orthopedic Surgery | Admitting: Orthopedic Surgery

## 2018-03-20 DIAGNOSIS — M75121 Complete rotator cuff tear or rupture of right shoulder, not specified as traumatic: Secondary | ICD-10-CM | POA: Insufficient documentation

## 2018-03-20 DIAGNOSIS — M19011 Primary osteoarthritis, right shoulder: Secondary | ICD-10-CM | POA: Insufficient documentation

## 2018-03-20 DIAGNOSIS — M25511 Pain in right shoulder: Secondary | ICD-10-CM

## 2018-03-28 DIAGNOSIS — M75121 Complete rotator cuff tear or rupture of right shoulder, not specified as traumatic: Secondary | ICD-10-CM | POA: Diagnosis not present

## 2018-03-28 DIAGNOSIS — M25811 Other specified joint disorders, right shoulder: Secondary | ICD-10-CM | POA: Diagnosis not present

## 2018-03-28 DIAGNOSIS — M19011 Primary osteoarthritis, right shoulder: Secondary | ICD-10-CM | POA: Diagnosis not present

## 2018-04-27 ENCOUNTER — Encounter: Payer: Self-pay | Admitting: Internal Medicine

## 2018-05-04 DIAGNOSIS — M5136 Other intervertebral disc degeneration, lumbar region: Secondary | ICD-10-CM | POA: Diagnosis not present

## 2018-05-04 DIAGNOSIS — M48061 Spinal stenosis, lumbar region without neurogenic claudication: Secondary | ICD-10-CM | POA: Diagnosis not present

## 2018-05-16 DIAGNOSIS — E039 Hypothyroidism, unspecified: Secondary | ICD-10-CM | POA: Diagnosis not present

## 2018-05-16 DIAGNOSIS — E6609 Other obesity due to excess calories: Secondary | ICD-10-CM | POA: Diagnosis not present

## 2018-05-16 DIAGNOSIS — E782 Mixed hyperlipidemia: Secondary | ICD-10-CM | POA: Diagnosis not present

## 2018-05-16 DIAGNOSIS — Z1389 Encounter for screening for other disorder: Secondary | ICD-10-CM | POA: Diagnosis not present

## 2018-05-16 DIAGNOSIS — E119 Type 2 diabetes mellitus without complications: Secondary | ICD-10-CM | POA: Diagnosis not present

## 2018-05-16 DIAGNOSIS — Z6830 Body mass index (BMI) 30.0-30.9, adult: Secondary | ICD-10-CM | POA: Diagnosis not present

## 2018-05-16 DIAGNOSIS — Q248 Other specified congenital malformations of heart: Secondary | ICD-10-CM | POA: Diagnosis not present

## 2018-05-16 DIAGNOSIS — I10 Essential (primary) hypertension: Secondary | ICD-10-CM | POA: Diagnosis not present

## 2018-05-16 DIAGNOSIS — I639 Cerebral infarction, unspecified: Secondary | ICD-10-CM | POA: Diagnosis not present

## 2018-05-21 ENCOUNTER — Telehealth: Payer: Self-pay | Admitting: Internal Medicine

## 2018-05-21 NOTE — Telephone Encounter (Signed)
Spoke with pt. She woke up in the middle of the night Thursday vomiting and diarrhea started. Pt didn't go to the ED and her PCP advised it but pt states she wasn't able to go due to constant diarrhea. Pts diarrhea d/c Saturday along with the vomiting. Pt also had some rectal bleeding that started Thursday and d/c Saturday. Pt has some soreness on her rectum and doesn't feel her hemorrhoid caused the bleeding. Pt is aware that if bleeding or symptoms start again, she should go to the ED.

## 2018-05-21 NOTE — Telephone Encounter (Signed)
Pt is due her 6 yr colonoscopy and I was going to offer her a nurse visit, but she is having occasional rectal bleeding (bright red blood). I told her that I would call her as soon as we get a cancellation because we are in OCT schedule and the next URG spot will be AUG 2.  She is aware that I will be calling once I get a cancellation and I told her that I would let the nurse be aware as well. [phone]

## 2018-05-22 ENCOUNTER — Ambulatory Visit (INDEPENDENT_AMBULATORY_CARE_PROVIDER_SITE_OTHER): Payer: Medicare Other | Admitting: Gastroenterology

## 2018-05-22 ENCOUNTER — Encounter: Payer: Self-pay | Admitting: *Deleted

## 2018-05-22 ENCOUNTER — Telehealth: Payer: Self-pay | Admitting: *Deleted

## 2018-05-22 ENCOUNTER — Encounter: Payer: Self-pay | Admitting: Gastroenterology

## 2018-05-22 ENCOUNTER — Other Ambulatory Visit: Payer: Self-pay | Admitting: *Deleted

## 2018-05-22 VITALS — BP 189/96 | HR 76 | Temp 98.0°F | Ht 65.0 in | Wt 173.0 lb

## 2018-05-22 DIAGNOSIS — K625 Hemorrhage of anus and rectum: Secondary | ICD-10-CM

## 2018-05-22 DIAGNOSIS — Z8371 Family history of colonic polyps: Secondary | ICD-10-CM

## 2018-05-22 DIAGNOSIS — R1032 Left lower quadrant pain: Secondary | ICD-10-CM | POA: Diagnosis not present

## 2018-05-22 MED ORDER — NA SULFATE-K SULFATE-MG SULF 17.5-3.13-1.6 GM/177ML PO SOLN: 1.0000 | ORAL | 0 refills | Status: DC

## 2018-05-22 NOTE — Telephone Encounter (Signed)
Pre-op scheduled for 07/19/18 at 9:00am. Called mobile and NA, called home # and pt son answered stating patient was not available. Will mail letter.

## 2018-05-22 NOTE — Progress Notes (Signed)
Primary Care Physician:  [name], MD Primary Gastroenterologist:  Dr. Gala Romney   Chief Complaint  Patient presents with  . Rectal Bleeding    over the weekend  . Abdominal Pain    left side  . Nausea    vomiting  . Diarrhea    HPI:   Brittany Goodman is a 72 y.o. female presenting today due to rectal bleeding and abdominal pain. Colonoscopy actually due this year with family history of colon polyps. Last colonoscopy 2014 with changes of likely ischemic colitis.   She notes 3/30, 4/29,  5/24, 6/3, 6/10. 6/25 and 6/28 had episodes of left-sided abdominal pain, followed by rectal bleeding. Starts out with nausea, not always vomiting. Sometimes could talk self out of it. Starts having left-sided abdominal pain, then urgency to have BM. Majority of these episodes with bleeding. Last episode the worse. First episode is soft stool if no rectal bleeding, then looser stool. During these episodes, her hands will turn blood red and burning. Feels like she is going to pass out. In between these episodes, no pain, normal BMs. Was prescribed antibiotics the end of April by Dr. Hilma Favors. Antibiotics improved discomfort. Appetite is usually ok for food that she "shouldn't eat". No appetite for meat. Drinking protein shakes. Drinking the Atkins shakes. Weight fluctuates. No pain after eating.    Takes 2 excedrin migraines a day for chronic back pain.   Past Medical History:  Diagnosis Date  . Arthritis   . Depression   . Diabetes mellitus type II   . Diverticulosis   . GERD (gastroesophageal reflux disease)   . Hyperlipidemia   . Hypertension   . Hypothyroidism   . Sepsis ([place])    2011, Escherichia coli pyelonephritis  . Spinal stenosis     Past Surgical History:  Procedure Laterality Date  . BACK SURGERY    . CARPAL TUNNEL RELEASE    . COLONOSCOPY N/A 05/29/2013   Focal left colonic inflammation, likely remnant of recent bout of ischemic or segmental colitis, s/p biopsy. pancolonic  diverticulosis. Due for surveillance in 5 years  . ESOPHAGOGASTRODUODENOSCOPY (EGD) WITH ESOPHAGEAL DILATION N/A 05/29/2013   normal appearing patent tubular esophagus, small hiatal hernia, multiple antral erosions. no ulcer. normlal duodenum. empiric dilation  . ILIOTIBIAL BAND RELEASE    . TOTAL ABDOMINAL HYSTERECTOMY      Current Outpatient Medications  Medication Sig Dispense Refill  . acetaminophen (TYLENOL) 500 MG tablet Take 500 mg by mouth 2 (two) times daily.    . Ascorbic Acid (VITAMIN C) 1000 MG tablet Take 2,000 mg by mouth 2 (two) times daily.     Marland Kitchen aspirin-acetaminophen-caffeine (EXCEDRIN MIGRAINE) 250-250-65 MG tablet Take 1 tablet by mouth 2 (two) times daily.    Marland Kitchen atenolol (TENORMIN) 100 MG tablet Take 100 mg by mouth daily.      . Cholecalciferol (VITAMIN D) 2000 units CAPS Take 1 capsule by mouth daily.    . citalopram (CELEXA) 20 MG tablet Take 20 mg by mouth every other day.     . cyclobenzaprine (FLEXERIL) 10 MG tablet Take 10 mg by mouth 2 (two) times daily as needed for muscle spasms.     . folic acid (FOLVITE) 706 MCG tablet Take 800 mcg by mouth daily.    Marland Kitchen glimepiride (AMARYL) 1 MG tablet Take 1 mg by mouth daily before breakfast.     . HYDROcodone-acetaminophen (NORCO/VICODIN) 5-325 MG tablet TAKE 1 TABLET BY MOUTH EVERY 4-6 HOURS AS NEEDED  0  . levothyroxine (SYNTHROID, LEVOTHROID) 88 MCG tablet Take 88 mcg by mouth daily.    Marland Kitchen lisinopril (PRINIVIL,ZESTRIL) 5 MG tablet Take 5 mg by mouth daily.     . metFORMIN (GLUCOPHAGE) 1000 MG tablet Take 1,000 mg by mouth 2 (two) times daily with a meal.     . omeprazole (PRILOSEC) 20 MG capsule Take 20 mg by mouth daily.     . vitamin B-12 (CYANOCOBALAMIN) 1000 MCG tablet Take 1,000 mcg by mouth daily.    . Na Sulfate-K Sulfate-Mg Sulf 17.5-3.13-1.6 GM/177ML SOLN Take 1 kit by mouth as directed. 1 Bottle 0   No current facility-administered medications for this visit.     Allergies as of 05/22/2018 - Review Complete  05/22/2018  Allergen Reaction Noted  . Codeine Other (See Comments) 08/04/2009  . Dilaudid [hydromorphone hcl]  05/06/2013  . Morphine  08/04/2009  . Tetracycline  08/04/2009    Family History  Problem Relation Age of Onset  . Heart disease Father   . Heart attack Father 3  . Heart disease Brother   . Colon polyps Brother   . Liver cancer Brother 64       deceased , ?metastatic cancer?   . Colon cancer Neg Hx     Social History   Socioeconomic History  . Marital status: Widowed    Spouse name: Not on file  . Number of children: 2  . Years of education: GED  . Highest education level: Not on file  Occupational History  . 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 Financial resource strain: Not on file  . Food insecurity:    Worry: Not on file    Inability: Not on file  . Transportation needs:    Medical: Not on file    Non-medical: Not on file  Tobacco Use  . Smoking status: Former Research scientist (life sciences)  . Smokeless tobacco: Former Systems developer    Types: Chew  . Tobacco comment: Smoked for about 12-13 years  Substance and Sexual Activity  . Alcohol use: No  . Drug use: No  . Sexual activity: Not on file  Lifestyle  . Physical activity:    Days per week: Not on file    Minutes per session: Not on file  . Stress: Not on file  Relationships  . Social connections:    Talks on phone: Not on file    Gets together: Not on file    Attends religious service: Not on file    Active member of club or organization: Not on file    Attends meetings of clubs or organizations: Not on file    Relationship status: Not on file  . Intimate partner violence:    Fear of current or ex partner: Not on file    Emotionally abused: Not on file    Physically abused: Not on file    Forced sexual activity: Not on file  Other Topics Concern  . Not on file  Social History Narrative   Lives with son   Caffeine use: Coffee daily   Right handed   No regular exercise     Review of Systems: Gen: Denies any fever, chills, fatigue, weight loss, lack of appetite.  CV: Denies chest pain, heart palpitations, peripheral edema, syncope.  Resp: Denies shortness of breath at rest or with exertion. Denies wheezing or cough.  GI: see HPI  GU : Denies urinary burning, urinary frequency, urinary hesitancy MS: Denies joint pain, muscle  weakness, cramps, or limitation of movement.  Derm: Denies rash, itching, dry skin Psych: Denies depression, anxiety, memory loss, and confusion Heme: see HPI   Physical Exam: BP (!) 189/96   Pulse 76   Temp 98 F (36.7 C) (Oral)   Ht 5' 5"  (1.651 m)   Wt 173 lb (78.5 kg)   BMI 28.79 kg/m  General:   Alert and oriented. Pleasant and cooperative. Well-nourished and well-developed.  Head:  Normocephalic and atraumatic. Eyes:  Without icterus, sclera clear and conjunctiva pink.  Ears:  Normal auditory acuity. Nose:  No deformity, discharge,  or lesions. Mouth:  No deformity or lesions, oral mucosa pink.  Lungs:  Clear to auscultation bilaterally.  Heart:  S1, S2 present without murmurs appreciated.  Abdomen:  +BS, soft, mild TTP LLQ and non-distended. No HSM noted. No guarding or rebound. No masses appreciated.  Rectal:  Deferred  Msk:  Symmetrical without gross deformities. Normal posture. Extremities:  Without edema. Neurologic:  Alert and  oriented x4 Psych:  Alert and cooperative. Normal mood and affect.

## 2018-05-22 NOTE — Telephone Encounter (Signed)
Possible viral gastroenteritis given sudden onset.   Would recommend office visit, ok to use urgent.   If symptoms recur she should go to ed.

## 2018-05-22 NOTE — Telephone Encounter (Signed)
Will call with pre-op appointment

## 2018-05-22 NOTE — Telephone Encounter (Signed)
Noted, pt was seen today and is aware of everything that needs to be done.

## 2018-05-22 NOTE — Patient Instructions (Addendum)
We have ordered a special CT scan in the near future. Do not take metformin the day of the scan and for 48 hours after, then you can start taking again.  We have arranged a colonoscopy in the near future with Dr. Gala Romney! Do not take metformin the day of the procedure.   If you have severe abdominal pain, go to the emergency room.  Further recommendations to follow!  It was a pleasure to see you today. I strive to create trusting relationships with patients to provide genuine, compassionate, and quality care. I value your feedback. If you receive a survey regarding your visit,  I greatly appreciate you taking time to fill this out.   Annitta Needs, PhD, ANP-BC [facility]

## 2018-05-25 ENCOUNTER — Encounter: Payer: Self-pay | Admitting: Gastroenterology

## 2018-05-25 NOTE — Assessment & Plan Note (Signed)
Query bouts with ischemic colitis. So as not to complicate picture, I have also asked her to stop any NSAIDs. Doubt dealing with diverticular origin. CT as planned. No concerning findings on exam today.

## 2018-05-25 NOTE — Assessment & Plan Note (Addendum)
72 year old female with episodic rectal bleeding preceded by abdominal pain and cramping, with last colonoscopy in 2014 noting focal left colonic inflammation likely remnant of recent bout with ischemic or segmental colitis. She is actually due for colonoscopy now with family history of polyps. She has had multiple episodes but doing well clinically today. I am ordering a CTA for the near future to assess mesenteric vasculature. We will also pursue colonoscopy thereafter. To ED if severe pain.  CTA in near future Proceed with TCS with Dr. Gala Romney in near future: the risks, benefits, and alternatives have been discussed with the patient in detail. The patient states understanding and desires to proceed. Propofol due to polypharmacy AVOID excedrin (she has been taking this daily). Avoid other NSAIDs

## 2018-05-25 NOTE — Assessment & Plan Note (Signed)
Brother, unknown age. Surveillance already due now.

## 2018-05-28 NOTE — Progress Notes (Signed)
CC'D TO PCP °

## 2018-06-01 DIAGNOSIS — E663 Overweight: Secondary | ICD-10-CM | POA: Diagnosis not present

## 2018-06-01 DIAGNOSIS — Z1389 Encounter for screening for other disorder: Secondary | ICD-10-CM | POA: Diagnosis not present

## 2018-06-01 DIAGNOSIS — Z0001 Encounter for general adult medical examination with abnormal findings: Secondary | ICD-10-CM | POA: Diagnosis not present

## 2018-06-01 DIAGNOSIS — Z6829 Body mass index (BMI) 29.0-29.9, adult: Secondary | ICD-10-CM | POA: Diagnosis not present

## 2018-06-08 ENCOUNTER — Ambulatory Visit (HOSPITAL_COMMUNITY)
Admission: RE | Admit: 2018-06-08 | Discharge: 2018-06-08 | Disposition: A | Discharge status: Home / Self Care | Payer: Medicare Other | Source: Ambulatory Visit | Attending: Gastroenterology | Admitting: Gastroenterology

## 2018-06-08 ENCOUNTER — Other Ambulatory Visit (HOSPITAL_COMMUNITY): Payer: Self-pay | Admitting: Family Medicine

## 2018-06-08 ENCOUNTER — Encounter (HOSPITAL_COMMUNITY): Payer: Self-pay

## 2018-06-08 DIAGNOSIS — K625 Hemorrhage of anus and rectum: Secondary | ICD-10-CM | POA: Diagnosis not present

## 2018-06-08 DIAGNOSIS — R1032 Left lower quadrant pain: Secondary | ICD-10-CM | POA: Insufficient documentation

## 2018-06-08 DIAGNOSIS — I701 Atherosclerosis of renal artery: Secondary | ICD-10-CM | POA: Diagnosis not present

## 2018-06-08 DIAGNOSIS — M5136 Other intervertebral disc degeneration, lumbar region: Secondary | ICD-10-CM | POA: Insufficient documentation

## 2018-06-08 DIAGNOSIS — I774 Celiac artery compression syndrome: Secondary | ICD-10-CM | POA: Diagnosis not present

## 2018-06-08 DIAGNOSIS — Z1231 Encounter for screening mammogram for malignant neoplasm of breast: Secondary | ICD-10-CM

## 2018-06-08 LAB — POCT I-STAT CREATININE: CREATININE: 0.8 mg/dL (ref 0.44–1.00)

## 2018-06-08 MED ORDER — IOPAMIDOL (ISOVUE-370) INJECTION 76%
100.0000 mL | Freq: Once | INTRAVENOUS | Status: AC | PRN
  Administered 2018-06-08: 100 mL via INTRAVENOUS

## 2018-06-12 ENCOUNTER — Other Ambulatory Visit: Payer: Self-pay | Admitting: *Deleted

## 2018-06-12 ENCOUNTER — Telehealth: Payer: Self-pay | Admitting: Internal Medicine

## 2018-06-12 DIAGNOSIS — R9389 Abnormal findings on diagnostic imaging of other specified body structures: Secondary | ICD-10-CM

## 2018-06-12 DIAGNOSIS — R1032 Left lower quadrant pain: Secondary | ICD-10-CM

## 2018-06-12 NOTE — Telephone Encounter (Signed)
Pt.notified

## 2018-06-12 NOTE — Telephone Encounter (Signed)
Pt had CT done last Friday. She calling to see if the results were back. [phone]

## 2018-07-02 ENCOUNTER — Ambulatory Visit (HOSPITAL_COMMUNITY)
Admission: RE | Admit: 2018-07-02 | Discharge: 2018-07-02 | Disposition: A | Discharge status: Home / Self Care | Payer: Medicare Other | Source: Ambulatory Visit | Attending: Family Medicine | Admitting: Family Medicine

## 2018-07-02 ENCOUNTER — Encounter: Payer: Self-pay | Admitting: Vascular Surgery

## 2018-07-02 ENCOUNTER — Ambulatory Visit (INDEPENDENT_AMBULATORY_CARE_PROVIDER_SITE_OTHER): Payer: Medicare Other | Admitting: Vascular Surgery

## 2018-07-02 VITALS — BP 168/104 | HR 73 | Temp 97.5°F | Resp 20 | Wt 175.0 lb

## 2018-07-02 DIAGNOSIS — Z1231 Encounter for screening mammogram for malignant neoplasm of breast: Secondary | ICD-10-CM | POA: Diagnosis not present

## 2018-07-02 DIAGNOSIS — R1032 Left lower quadrant pain: Secondary | ICD-10-CM | POA: Diagnosis not present

## 2018-07-02 NOTE — Progress Notes (Signed)
  [facility]  Patient name: Brittany Goodman MRN: [id number] DOB: 11/27/1945 Sex: female   Seen in our [place] office  REASON FOR CONSULT: Possible mesenteric ischemia  HPI: Brittany Goodman is a 71 y.o. female, who is in today for discussion of her abdominal discomfort and recent CT angiogram.  Reports several components of her abdominal symptoms.  She reports food being caught up in her chest when she is eating.  Also reports very unusual issue of and straining it had a bowel movement having her feet and hands turn red and resolves after her bowel movement.  She also reports some chronic left lower quadrant discomfort.  Has alternating diarrhea and constipation and occasionally has had mucousy blood in her stool.  She specifically denies any association with eating.  She does not have any food fear and has no pain after a meal.  Have hypertension which is controlled with medical therapy.  Past Medical History:  Diagnosis Date  . Arthritis   . Depression   . Diabetes mellitus type II   . Diverticulosis   . GERD (gastroesophageal reflux disease)   . Hyperlipidemia   . Hypertension   . Hypothyroidism   . Sepsis (HCC)    2011, Escherichia coli pyelonephritis  . Spinal stenosis     Family History  Problem Relation Age of Onset  . Heart disease Father   . Heart attack Father 38  . Heart disease Brother   . Colon polyps Brother   . Liver cancer Brother 70       deceased , ?metastatic cancer?   . Colon cancer Neg Hx     SOCIAL HISTORY: Social History   Socioeconomic History  . Marital status: Widowed    Spouse name: Not on file  . Number of children: 2  . Years of education: GED  . Highest education level: Not on file  Occupational History  . 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 Financial resource strain: Not on file  . Food insecurity:    Worry: Not on file   Inability: Not on file  . Transportation needs:    Medical: Not on file    Non-medical: Not on file  Tobacco Use  . Smoking status: Former Smoker  . Smokeless tobacco: Former User    Types: Chew  . Tobacco comment: Smoked for about 12-13 years  Substance and Sexual Activity  . Alcohol use: No  . Drug use: No  . Sexual activity: Not on file  Lifestyle  . Physical activity:    Days per week: Not on file    Minutes per session: Not on file  . Stress: Not on file  Relationships  . Social connections:    Talks on phone: Not on file    Gets together: Not on file    Attends religious service: Not on file    Active member of club or organization: Not on file    Attends meetings of clubs or organizations: Not on file    Relationship status: Not on file  . Intimate partner violence:    Fear of current or ex partner: Not on file    Emotionally abused: Not on file    Physically abused: Not on file    Forced sexual activity: Not on file  Other Topics Concern  . Not on file  Social History Narrative   Lives with son   Caffeine use: Coffee daily     Right handed   No regular exercise    Allergies  Allergen Reactions  . Codeine Other (See Comments)    "makes me feel bad"  . Dilaudid [Hydromorphone Hcl]     hallucination  . Morphine     vomiting  . Tetracycline     vomiting    Current Outpatient Medications  Medication Sig Dispense Refill  . acetaminophen (TYLENOL) 500 MG tablet Take 500 mg by mouth 2 (two) times daily.    . Ascorbic Acid (VITAMIN C) 1000 MG tablet Take 2,000 mg by mouth 2 (two) times daily.     Marland Kitchen aspirin-acetaminophen-caffeine (EXCEDRIN MIGRAINE) 250-250-65 MG tablet Take 1 tablet by mouth 2 (two) times daily.    Marland Kitchen atenolol (TENORMIN) 100 MG tablet Take 100 mg by mouth daily.      . Cholecalciferol (VITAMIN D) 2000 units CAPS Take 1 capsule by mouth daily.    . citalopram (CELEXA) 20 MG tablet Take 20 mg by mouth every other day.     . cyclobenzaprine  (FLEXERIL) 10 MG tablet Take 10 mg by mouth 2 (two) times daily as needed for muscle spasms.     . folic acid (FOLVITE) 631 MCG tablet Take 800 mcg by mouth daily.    Marland Kitchen glimepiride (AMARYL) 1 MG tablet Take 1 mg by mouth daily before breakfast.     . HYDROcodone-acetaminophen (NORCO/VICODIN) 5-325 MG tablet TAKE 1 TABLET BY MOUTH EVERY 4-6 HOURS AS NEEDED  0  . levothyroxine (SYNTHROID, LEVOTHROID) 88 MCG tablet Take 88 mcg by mouth daily.    Marland Kitchen lisinopril (PRINIVIL,ZESTRIL) 5 MG tablet Take 5 mg by mouth daily.     . metFORMIN (GLUCOPHAGE) 1000 MG tablet Take 1,000 mg by mouth 2 (two) times daily with a meal.     . Na Sulfate-K Sulfate-Mg Sulf 17.5-3.13-1.6 GM/177ML SOLN Take 1 kit by mouth as directed. 1 Bottle 0  . omeprazole (PRILOSEC) 20 MG capsule Take 20 mg by mouth daily.     . vitamin B-12 (CYANOCOBALAMIN) 1000 MCG tablet Take 1,000 mcg by mouth daily.     No current facility-administered medications for this visit.     REVIEW OF SYSTEMS:  [X] denotes positive finding, [ ] denotes negative finding Cardiac  Comments:  Chest pain or chest pressure:    Shortness of breath upon exertion:    Short of breath when lying flat:    Irregular heart rhythm:        Vascular    Pain in calf, thigh, or hip brought on by ambulation: x   Pain in feet at night that wakes you up from your sleep:     Blood clot in your veins:    Leg swelling:  x       Pulmonary    Oxygen at home:    Productive cough:     Wheezing:         Neurologic    Sudden weakness in arms or legs:     Sudden numbness in arms or legs:     Sudden onset of difficulty speaking or slurred speech:    Temporary loss of vision in one eye:     Problems with dizziness:         Gastrointestinal    Blood in stool:  x   Vomited blood:         Genitourinary    Burning when urinating:     Blood in urine:        Psychiatric  Major depression:         Hematologic    Bleeding problems:    Problems with blood clotting too  easily:        Skin    Rashes or ulcers:        Constitutional    Fever or chills:      PHYSICAL EXAM: Vitals:   07/02/18 1335 07/02/18 1338  BP: (!) 166/102 (!) 168/104  Pulse: 69 73  Resp: 20   Temp: (!) 97.5 F (36.4 C)   TempSrc: Temporal   Weight: 175 lb (79.4 kg)     GENERAL: The patient is a well-nourished female, in no acute distress. The vital signs are documented above. CARDIOVASCULAR: Carotid arteries without bruits bilaterally.  2+ radial, femoral and dorsalis pedis pulses bilaterally. PULMONARY: There is good air exchange  ABDOMEN: Soft and non-tender.  No abdominal bruit MUSCULOSKELETAL: There are no major deformities or cyanosis. NEUROLOGIC: No focal weakness or paresthesias are detected. SKIN: There are no ulcers or rashes noted. PSYCHIATRIC: The patient has a normal affect.  DATA:  Did review her CT scan films and discussed them with the patient and her family present.  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  She has calcification but no flow-limiting stenosis in the superior mesenteric artery.  Her inferior mesenteric artery is widely patent.  She does have moderate left renal artery stenosis.    MEDICAL ISSUES: I discussed these findings at length with the patient and her family.  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  I explained the rich collaterals with mesenteric blood flow and that she has widely patent SMA and inferior mesenteric artery.  She does have moderate left renal artery stenosis but this would not be to the level that we would recommend any intervention and would recommend continued medical treatment for her hypertension.  Relieved with this discussion.  She will continue her GI work-up as is planned and will see us again on an as-needed basis    F. , MD  FACS [facility] Office Tel [phone] Pager [phone]    

## 2018-07-16 NOTE — Patient Instructions (Signed)
Brittany Goodman  07/16/2018     @PREFPERIOPPHARMACY @   Your procedure is scheduled on  07/26/2018  Report to [facility] at  700   A.M.  Call this number if you have problems the morning of surgery:  [phone]   Remember:  Do not eat or drink after midnight.  You may drink clear liquids until  (follow the instructions given to you) .  Clear liquids allowed are:                    Water, Juice (non-citric and without pulp), Carbonated beverages, Clear Tea, Black Coffee only, Plain Jell-O only, Gatorade and Plain Popsicles only    Take these medicines the morning of surgery with A SIP OF WATER  Atenolol, celexa, flexaril, hydrocodone, levothyroxine, claritin, prilosec.    Do not wear jewelry, make-up or nail polish.  Do not wear lotions, powders, or perfumes, or deodorant.  Do not shave 48 hours prior to surgery.  Men may shave face and neck.  Do not bring valuables to the hospital.  [facility] is not responsible for any belongings or valuables.  Contacts, dentures or bridgework may not be worn into surgery.  Leave your suitcase in the car.  After surgery it may be brought to your room.  For patients admitted to the hospital, discharge time will be determined by your treatment team.  Patients discharged the day of surgery will not be allowed to drive home.   Name and phone number of your driver:   family Special instructions:  Follow the diet and prep instructions given to you by Dr Roseanne Kaufman office.  Please read over the following fact sheets that you were given. Anesthesia Post-op Instructions and Care and Recovery After Surgery       Colonoscopy, Adult A colonoscopy is an exam to look at the large intestine. It is done to check for problems, such as:  Lumps (tumors).  Growths (polyps).  Swelling (inflammation).  Bleeding.  What happens before the procedure? Eating and drinking Follow instructions from your doctor about eating and drinking. These  instructions may include:  A few days before the procedure - follow a low-fiber diet. ? Avoid nuts. ? Avoid seeds. ? Avoid dried fruit. ? Avoid raw fruits. ? Avoid vegetables.  1-3 days before the procedure - follow a clear liquid diet. Avoid liquids that have red or purple dye. Drink only clear liquids, such as: ? Clear broth or bouillon. ? Black coffee or tea. ? Clear juice. ? Clear soft drinks or sports drinks. ? Gelatin dessert. ? Popsicles.  On the day of the procedure - do not eat or drink anything during the 2 hours before the procedure.  Bowel prep If you were prescribed an oral bowel prep:  Take it as told by your doctor. Starting the day before your procedure, you will need to drink a lot of liquid. The liquid will cause you to poop (have bowel movements) until your poop is almost clear or light green.  If your skin or butt gets irritated from diarrhea, you may: ? Wipe the area with wipes that have medicine in them, such as adult wet wipes with aloe and vitamin E. ? Put something on your skin that soothes the area, such as petroleum jelly.  If you throw up (vomit) while drinking the bowel prep, take a break for up to 60 minutes. Then begin the bowel prep again. If  you keep throwing up and you cannot take the bowel prep without throwing up, call your doctor.  General instructions  Ask your doctor about changing or stopping your normal medicines. This is important if you take diabetes medicines or blood thinners.  Plan to have someone take you home from the hospital or clinic. What happens during the procedure?  An IV tube may be put into one of your veins.  You will be given medicine to help you relax (sedative).  To reduce your risk of infection: ? Your doctors will wash their hands. ? Your anal area will be washed with soap.  You will be asked to lie on your side with your knees bent.  Your doctor will get a long, thin, flexible tube ready. The tube will have  a camera and a light on the end.  The tube will be put into your anus.  The tube will be gently put into your large intestine.  Air will be delivered into your large intestine to keep it open. You may feel some pressure or cramping.  The camera will be used to take photos.  A small tissue sample may be removed from your body to be looked at under a microscope (biopsy). If any possible problems are found, the tissue will be sent to a lab for testing.  If small growths are found, your doctor may remove them and have them checked for cancer.  The tube that was put into your anus will be slowly removed. The procedure may vary among doctors and hospitals. What happens after the procedure?  Your doctor will check on you often until the medicines you were given have worn off.  Do not drive for 24 hours after the procedure.  You may have a small amount of blood in your poop.  You may pass gas.  You may have mild cramps or bloating in your belly (abdomen).  It is up to you to get the results of your procedure. Ask your doctor, or the department performing the procedure, when your results will be ready. This information is not intended to replace advice given to you by your health care provider. Make sure you discuss any questions you have with your health care provider. Document Released: 12/10/2010 Document Revised: 09/07/2016 Document Reviewed: 01/19/2016 Elsevier Interactive Patient Education  2017 [employer]  Colonoscopy, Adult, Care After This sheet gives you information about how to care for yourself after your procedure. Your health care provider may also give you more specific instructions. If you have problems or questions, contact your health care provider. What can I expect after the procedure? After the procedure, it is common to have:  A small amount of blood in your stool for 24 hours after the procedure.  Some gas.  Mild abdominal cramping or bloating.  Follow  these instructions at home: General instructions   For the first 24 hours after the procedure: ? Do not drive or use machinery. ? Do not sign important documents. ? Do not drink alcohol. ? Do your regular daily activities at a slower pace than normal. ? Eat soft, easy-to-digest foods. ? Rest often.  Take over-the-counter or prescription medicines only as told by your health care provider.  It is up to you to get the results of your procedure. Ask your health care provider, or the department performing the procedure, when your results will be ready. Relieving cramping and bloating  Try walking around when you have cramps or feel bloated.  Apply heat  to your abdomen as told by your health care provider. Use a heat source that your health care provider recommends, such as a moist heat pack or a heating pad. ? Place a towel between your skin and the heat source. ? Leave the heat on for 20-30 minutes. ? Remove the heat if your skin turns bright red. This is especially important if you are unable to feel pain, heat, or cold. You may have a greater risk of getting burned. Eating and drinking  Drink enough fluid to keep your urine clear or pale yellow.  Resume your normal diet as instructed by your health care provider. Avoid heavy or fried foods that are hard to digest.  Avoid drinking alcohol for as long as instructed by your health care provider. Contact a health care provider if:  You have blood in your stool 2-3 days after the procedure. Get help right away if:  You have more than a small spotting of blood in your stool.  You pass large blood clots in your stool.  Your abdomen is swollen.  You have nausea or vomiting.  You have a fever.  You have increasing abdominal pain that is not relieved with medicine. This information is not intended to replace advice given to you by your health care provider. Make sure you discuss any questions you have with your health care  provider. Document Released: 06/21/2004 Document Revised: 08/01/2016 Document Reviewed: 01/19/2016 Elsevier Interactive Patient Education  2018 [place] Anesthesia is a term that refers to techniques, procedures, and medicines that help a person stay safe and comfortable during a medical procedure. Monitored anesthesia care, or sedation, is one type of anesthesia. Your anesthesia specialist may recommend sedation if you will be having a procedure that does not require you to be unconscious, such as:  Cataract surgery.  A dental procedure.  A biopsy.  A colonoscopy.  During the procedure, you may receive a medicine to help you relax (sedative). There are three levels of sedation:  Mild sedation. At this level, you may feel awake and relaxed. You will be able to follow directions.  Moderate sedation. At this level, you will be sleepy. You may not remember the procedure.  Deep sedation. At this level, you will be asleep. You will not remember the procedure.  The more medicine you are given, the deeper your level of sedation will be. Depending on how you respond to the procedure, the anesthesia specialist may change your level of sedation or the type of anesthesia to fit your needs. An anesthesia specialist will monitor you closely during the procedure. Let your health care provider know about:  Any allergies you have.  All medicines you are taking, including vitamins, herbs, eye drops, creams, and over-the-counter medicines.  Any use of steroids (by mouth or as a cream).  Any problems you or family members have had with sedatives and anesthetic medicines.  Any blood disorders you have.  Any surgeries you have had.  Any medical conditions you have, such as sleep apnea.  Whether you are pregnant or may be pregnant.  Any use of cigarettes, alcohol, or street drugs. What are the risks? Generally, this is a safe procedure. However, problems may  occur, including:  Getting too much medicine (oversedation).  Nausea.  Allergic reaction to medicines.  Trouble breathing. If this happens, a breathing tube may be used to help with breathing. It will be removed when you are awake and breathing on your own.  Heart  trouble.  Lung trouble.  Before the procedure Staying hydrated Follow instructions from your health care provider about hydration, which may include:  Up to 2 hours before the procedure - you may continue to drink clear liquids, such as water, clear fruit juice, black coffee, and plain tea.  Eating and drinking restrictions Follow instructions from your health care provider about eating and drinking, which may include:  8 hours before the procedure - stop eating heavy meals or foods such as meat, fried foods, or fatty foods.  6 hours before the procedure - stop eating light meals or foods, such as toast or cereal.  6 hours before the procedure - stop drinking milk or drinks that contain milk.  2 hours before the procedure - stop drinking clear liquids.  Medicines Ask your health care provider about:  Changing or stopping your regular medicines. This is especially important if you are taking diabetes medicines or blood thinners.  Taking medicines such as aspirin and ibuprofen. These medicines can thin your blood. Do not take these medicines before your procedure if your health care provider instructs you not to.  Tests and exams  You will have a physical exam.  You may have blood tests done to show: ? How well your kidneys and liver are working. ? How well your blood can clot.  General instructions  Plan to have someone take you home from the hospital or clinic.  If you will be going home right after the procedure, plan to have someone with you for 24 hours.  What happens during the procedure?  Your blood pressure, heart rate, breathing, level of pain and overall condition will be monitored.  An IV  tube will be inserted into one of your veins.  Your anesthesia specialist will give you medicines as needed to keep you comfortable during the procedure. This may mean changing the level of sedation.  The procedure will be performed. After the procedure  Your blood pressure, heart rate, breathing rate, and blood oxygen level will be monitored until the medicines you were given have worn off.  Do not drive for 24 hours if you received a sedative.  You may: ? Feel sleepy, clumsy, or nauseous. ? Feel forgetful about what happened after the procedure. ? Have a sore throat if you had a breathing tube during the procedure. ? Vomit. This information is not intended to replace advice given to you by your health care provider. Make sure you discuss any questions you have with your health care provider. Document Released: 08/03/2005 Document Revised: 04/15/2016 Document Reviewed: 02/28/2016 Elsevier Interactive Patient Education  2018 [place], Care After These instructions provide you with information about caring for yourself after your procedure. Your health care provider may also give you more specific instructions. Your treatment has been planned according to current medical practices, but problems sometimes occur. Call your health care provider if you have any problems or questions after your procedure. What can I expect after the procedure? After your procedure, it is common to:  Feel sleepy for several hours.  Feel clumsy and have poor balance for several hours.  Feel forgetful about what happened after the procedure.  Have poor judgment for several hours.  Feel nauseous or vomit.  Have a sore throat if you had a breathing tube during the procedure.  Follow these instructions at home: For at least 24 hours after the procedure:   Do not: ? Participate in activities in which you could fall or  become injured. ? Drive. ? Use heavy  machinery. ? Drink alcohol. ? Take sleeping pills or medicines that cause drowsiness. ? Make important decisions or sign legal documents. ? Take care of children on your own.  Rest. Eating and drinking  Follow the diet that is recommended by your health care provider.  If you vomit, drink water, juice, or soup when you can drink without vomiting.  Make sure you have little or no nausea before eating solid foods. General instructions  Have a responsible adult stay with you until you are awake and alert.  Take over-the-counter and prescription medicines only as told by your health care provider.  If you smoke, do not smoke without supervision.  Keep all follow-up visits as told by your health care provider. This is important. Contact a health care provider if:  You keep feeling nauseous or you keep vomiting.  You feel light-headed.  You develop a rash.  You have a fever. Get help right away if:  You have trouble breathing. This information is not intended to replace advice given to you by your health care provider. Make sure you discuss any questions you have with your health care provider. Document Released: 02/28/2016 Document Revised: 06/29/2016 Document Reviewed: 02/28/2016 Elsevier Interactive Patient Education  [employer].

## 2018-07-19 ENCOUNTER — Other Ambulatory Visit: Payer: Self-pay

## 2018-07-19 ENCOUNTER — Encounter (HOSPITAL_COMMUNITY): Payer: Self-pay

## 2018-07-19 ENCOUNTER — Encounter (HOSPITAL_COMMUNITY)
Admission: RE | Admit: 2018-07-19 | Discharge: 2018-07-19 | Disposition: A | Discharge status: Home / Self Care | Payer: Medicare Other | Source: Ambulatory Visit | Attending: Internal Medicine | Admitting: Internal Medicine

## 2018-07-19 DIAGNOSIS — Z01810 Encounter for preprocedural cardiovascular examination: Secondary | ICD-10-CM | POA: Diagnosis not present

## 2018-07-19 DIAGNOSIS — Z01812 Encounter for preprocedural laboratory examination: Secondary | ICD-10-CM | POA: Insufficient documentation

## 2018-07-19 HISTORY — DX: Sleep apnea, unspecified: G47.30

## 2018-07-19 HISTORY — DX: Cerebral infarction, unspecified: I63.9

## 2018-07-19 LAB — CBC WITH DIFFERENTIAL/PLATELET
Basophils Absolute: 0.1 10*3/uL (ref 0.0–0.1)
Basophils Relative: 1 %
Eosinophils Absolute: 0.3 10*3/uL (ref 0.0–0.7)
Eosinophils Relative: 5 %
HEMATOCRIT: 38.4 % (ref 36.0–46.0)
Hemoglobin: 12.4 g/dL (ref 12.0–15.0)
Lymphocytes Relative: 38 %
Lymphs Abs: 2.5 10*3/uL (ref 0.7–4.0)
MCH: 27.9 pg (ref 26.0–34.0)
MCHC: 32.3 g/dL (ref 30.0–36.0)
MCV: 86.5 fL (ref 78.0–100.0)
MONO ABS: 0.5 10*3/uL (ref 0.1–1.0)
MONOS PCT: 8 %
NEUTROS ABS: 3.2 10*3/uL (ref 1.7–7.7)
Neutrophils Relative %: 48 %
Platelets: 267 10*3/uL (ref 150–400)
RBC: 4.44 MIL/uL (ref 3.87–5.11)
RDW: 14.2 % (ref 11.5–15.5)
WBC: 6.6 10*3/uL (ref 4.0–10.5)

## 2018-07-19 LAB — BASIC METABOLIC PANEL
Anion gap: 11 (ref 5–15)
BUN: 22 mg/dL (ref 8–23)
CALCIUM: 9.9 mg/dL (ref 8.9–10.3)
CO2: 25 mmol/L (ref 22–32)
CREATININE: 0.75 mg/dL (ref 0.44–1.00)
Chloride: 99 mmol/L (ref 98–111)
GFR calc Af Amer: >60 mL/min (ref >60)
GLUCOSE: 95 mg/dL (ref 70–99)
Potassium: 4.1 mmol/L (ref 3.5–5.1)
Sodium: 135 mmol/L (ref 135–145)

## 2018-07-26 ENCOUNTER — Ambulatory Visit (HOSPITAL_COMMUNITY): Payer: Medicare Other | Admitting: Anesthesiology

## 2018-07-26 ENCOUNTER — Encounter (HOSPITAL_COMMUNITY)
Admission: RE | Disposition: A | Discharge status: Home / Self Care | Payer: Self-pay | Source: Ambulatory Visit | Attending: Internal Medicine

## 2018-07-26 ENCOUNTER — Encounter (HOSPITAL_COMMUNITY): Payer: Self-pay | Admitting: *Deleted

## 2018-07-26 ENCOUNTER — Ambulatory Visit (HOSPITAL_COMMUNITY)
Admission: RE | Admit: 2018-07-26 | Discharge: 2018-07-26 | Disposition: A | Discharge status: Home / Self Care | Payer: Medicare Other | Source: Ambulatory Visit | Attending: Internal Medicine | Admitting: Internal Medicine

## 2018-07-26 DIAGNOSIS — I10 Essential (primary) hypertension: Secondary | ICD-10-CM | POA: Insufficient documentation

## 2018-07-26 DIAGNOSIS — Z7984 Long term (current) use of oral hypoglycemic drugs: Secondary | ICD-10-CM | POA: Insufficient documentation

## 2018-07-26 DIAGNOSIS — K648 Other hemorrhoids: Secondary | ICD-10-CM | POA: Insufficient documentation

## 2018-07-26 DIAGNOSIS — E785 Hyperlipidemia, unspecified: Secondary | ICD-10-CM | POA: Diagnosis not present

## 2018-07-26 DIAGNOSIS — Z87891 Personal history of nicotine dependence: Secondary | ICD-10-CM | POA: Insufficient documentation

## 2018-07-26 DIAGNOSIS — K219 Gastro-esophageal reflux disease without esophagitis: Secondary | ICD-10-CM | POA: Diagnosis not present

## 2018-07-26 DIAGNOSIS — Z79899 Other long term (current) drug therapy: Secondary | ICD-10-CM | POA: Diagnosis not present

## 2018-07-26 DIAGNOSIS — F329 Major depressive disorder, single episode, unspecified: Secondary | ICD-10-CM | POA: Diagnosis not present

## 2018-07-26 DIAGNOSIS — E119 Type 2 diabetes mellitus without complications: Secondary | ICD-10-CM | POA: Diagnosis not present

## 2018-07-26 DIAGNOSIS — E039 Hypothyroidism, unspecified: Secondary | ICD-10-CM | POA: Diagnosis not present

## 2018-07-26 DIAGNOSIS — K921 Melena: Secondary | ICD-10-CM

## 2018-07-26 DIAGNOSIS — Z8673 Personal history of transient ischemic attack (TIA), and cerebral infarction without residual deficits: Secondary | ICD-10-CM | POA: Diagnosis not present

## 2018-07-26 DIAGNOSIS — Z8371 Family history of colonic polyps: Secondary | ICD-10-CM | POA: Insufficient documentation

## 2018-07-26 DIAGNOSIS — K5730 Diverticulosis of large intestine without perforation or abscess without bleeding: Secondary | ICD-10-CM | POA: Diagnosis not present

## 2018-07-26 DIAGNOSIS — D123 Benign neoplasm of transverse colon: Secondary | ICD-10-CM | POA: Diagnosis not present

## 2018-07-26 DIAGNOSIS — Z7982 Long term (current) use of aspirin: Secondary | ICD-10-CM | POA: Insufficient documentation

## 2018-07-26 DIAGNOSIS — D214 Benign neoplasm of connective and other soft tissue of abdomen: Secondary | ICD-10-CM | POA: Diagnosis not present

## 2018-07-26 DIAGNOSIS — G4730 Sleep apnea, unspecified: Secondary | ICD-10-CM | POA: Insufficient documentation

## 2018-07-26 DIAGNOSIS — K625 Hemorrhage of anus and rectum: Secondary | ICD-10-CM

## 2018-07-26 HISTORY — PX: POLYPECTOMY: SHX5525

## 2018-07-26 HISTORY — PX: COLONOSCOPY WITH PROPOFOL: SHX5780

## 2018-07-26 LAB — GLUCOSE, CAPILLARY
GLUCOSE-CAPILLARY: 101 mg/dL — AB (ref 70–99)
Glucose-Capillary: 92 mg/dL (ref 70–99)

## 2018-07-26 SURGERY — COLONOSCOPY WITH PROPOFOL
Anesthesia: Monitor Anesthesia Care

## 2018-07-26 MED ORDER — HYDROCODONE-ACETAMINOPHEN 7.5-325 MG PO TABS: 1.0000 | ORAL_TABLET | Freq: Once | ORAL | Status: DC | PRN

## 2018-07-26 MED ORDER — CHLORHEXIDINE GLUCONATE CLOTH 2 % EX PADS: 6.0000 | MEDICATED_PAD | Freq: Once | CUTANEOUS | Status: DC

## 2018-07-26 MED ORDER — PROPOFOL 10 MG/ML IV BOLUS
INTRAVENOUS | Status: DC | PRN
  Administered 2018-07-26: 10 mg via INTRAVENOUS
  Administered 2018-07-26 (×3): 20 mg via INTRAVENOUS

## 2018-07-26 MED ORDER — PROMETHAZINE HCL 25 MG/ML IJ SOLN: 6.2500 mg | INTRAMUSCULAR | Status: DC | PRN

## 2018-07-26 MED ORDER — STERILE WATER FOR IRRIGATION IR SOLN
Status: DC | PRN
  Administered 2018-07-26: 100 mL

## 2018-07-26 MED ORDER — PROPOFOL 10 MG/ML IV BOLUS
INTRAVENOUS | Status: AC
  Filled 2018-07-26: qty 20

## 2018-07-26 MED ORDER — LACTATED RINGERS IV SOLN
INTRAVENOUS | Status: DC
  Administered 2018-07-26: 1000 mL via INTRAVENOUS

## 2018-07-26 MED ORDER — PROPOFOL 500 MG/50ML IV EMUL
INTRAVENOUS | Status: DC | PRN
  Administered 2018-07-26: 100 ug/kg/min via INTRAVENOUS

## 2018-07-26 MED ORDER — PROPOFOL 10 MG/ML IV BOLUS
INTRAVENOUS | Status: AC
  Filled 2018-07-26: qty 40

## 2018-07-26 MED ORDER — MEPERIDINE HCL 100 MG/ML IJ SOLN: 6.2500 mg | INTRAMUSCULAR | Status: DC | PRN

## 2018-07-26 MED ORDER — LACTATED RINGERS IV SOLN: INTRAVENOUS | Status: DC

## 2018-07-26 MED ORDER — HYDROMORPHONE HCL 1 MG/ML IJ SOLN: 0.2500 mg | INTRAMUSCULAR | Status: DC | PRN

## 2018-07-26 NOTE — Transfer of Care (Signed)
Immediate Anesthesia Transfer of Care Note  Patient: Brittany Goodman  Procedure(s) Performed: COLONOSCOPY WITH PROPOFOL (N/A ) POLYPECTOMY  Patient Location: PACU  Anesthesia Type:MAC  Level of Consciousness: awake and alert   Airway & Oxygen Therapy: Patient Spontanous Breathing  Post-op Assessment: Report given to RN  Post vital signs: Reviewed and stable  Last Vitals:  Vitals Value Taken Time  BP 168/88 07/26/2018  9:46 AM  Temp    Pulse 70 07/26/2018  9:48 AM  Resp 33 07/26/2018  9:48 AM  SpO2 94 % 07/26/2018  9:48 AM  Vitals shown include unvalidated device data.  Last Pain:  Vitals:   07/26/18 0942  TempSrc:   PainSc: 0-No pain      Patients Stated Pain Goal: 8 (18/98/42 1031)  Complications: No apparent anesthesia complications

## 2018-07-26 NOTE — Discharge Instructions (Signed)
°  Colonoscopy Discharge Instructions  Read the instructions outlined below and refer to this sheet in the next few weeks. These discharge instructions provide you with general information on caring for yourself after you leave the hospital. Your doctor may also give you specific instructions. While your treatment has been planned according to the most current medical practices available, unavoidable complications occasionally occur. If you have any problems or questions after discharge, call Dr. Gala Romney at [phone]. ACTIVITY  You may resume your regular activity, but move at a slower pace for the next 24 hours.   Take frequent rest periods for the next 24 hours.   Walking will help get rid of the air and reduce the bloated feeling in your belly (abdomen).   No driving for 24 hours (because of the medicine (anesthesia) used during the test).    Do not sign any important legal documents or operate any machinery for 24 hours (because of the anesthesia used during the test).  NUTRITION  Drink plenty of fluids.   You may resume your normal diet as instructed by your doctor.   Begin with a light meal and progress to your normal diet. Heavy or fried foods are harder to digest and may make you feel sick to your stomach (nauseated).   Avoid alcoholic beverages for 24 hours or as instructed.  MEDICATIONS  You may resume your normal medications unless your doctor tells you otherwise.  WHAT YOU CAN EXPECT TODAY  Some feelings of bloating in the abdomen.   Passage of more gas than usual.   Spotting of blood in your stool or on the toilet paper.  IF YOU HAD POLYPS REMOVED DURING THE COLONOSCOPY:  No aspirin products for 7 days or as instructed.   No alcohol for 7 days or as instructed.   Eat a soft diet for the next 24 hours.  FINDING OUT THE RESULTS OF YOUR TEST Not all test results are available during your visit. If your test results are not back during the visit, make an appointment  with your caregiver to find out the results. Do not assume everything is normal if you have not heard from your caregiver or the medical facility. It is important for you to follow up on all of your test results.  SEEK IMMEDIATE MEDICAL ATTENTION IF:  You have more than a spotting of blood in your stool.   Your belly is swollen (abdominal distention).   You are nauseated or vomiting.   You have a temperature over 101.   You have abdominal pain or discomfort that is severe or gets worse throughout the day.   Hemorrhoids, diverticulosis and colon polyp information provided  Further recommendations to follow pending review of pathology

## 2018-07-26 NOTE — Op Note (Signed)
[facility] Patient Name: Brittany Goodman Procedure Date: 07/26/2018 8:35 AM MRN: [id number] Date of Birth: 12-13-1945 Attending MD: Norvel Richards , MD CSN: [id number] Age: 72 Admit Type: Outpatient Procedure:                Colonoscopy Indications:              Hematochezia Providers:                Norvel Richards, MD, Rosina Lowenstein, RN, Randa Spike, Technician Referring MD:              Medicines:                Propofol per Anesthesia Complications:            No immediate complications. Estimated Blood Loss:     Estimated blood loss was minimal. Procedure:                Pre-Anesthesia Assessment:                           - Prior to the procedure, a History and Physical                            was performed, and patient medications and                            allergies were reviewed. The patient's tolerance of                            previous anesthesia was also reviewed. The risks                            and benefits of the procedure and the sedation                            options and risks were discussed with the patient.                            All questions were answered, and informed consent                            was obtained. Prior Anticoagulants: The patient has                            taken no previous anticoagulant or antiplatelet                            agents. ASA Grade Assessment: II - A patient with                            mild systemic disease. After reviewing the risks  and benefits, the patient was deemed in                            satisfactory condition to undergo the procedure.                           After obtaining informed consent, the colonoscope                            was passed under direct vision. Throughout the                            procedure, the patient's blood pressure, pulse, and                            oxygen saturations were  monitored continuously. The                            [id number]-[id number] ([id number]) scope was introduced through                            the and advanced to the the cecum, identified by                            appendiceal orifice and ileocecal valve. The                            colonoscopy was performed without difficulty. The                            patient tolerated the procedure well. The quality                            of the bowel preparation was adequate. Scope In: 9:17:04 AM Scope Out: 9:39:23 AM Scope Withdrawal Time: 0 hours 10 minutes 47 seconds  Total Procedure Duration: 0 hours 22 minutes 19 seconds  Findings:      The perianal and digital rectal examinations were normal.      Multiple small and large-mouthed diverticula were found in the entire       colon.      A 5 mm polyp was found in the hepatic flexure. The polyp was       semi-pedunculated. The polyp was removed with a cold snare. Resection       and retrieval were complete. Estimated blood loss was minimal.      Internal hemorrhoids were found.      The exam was otherwise without abnormality on direct and retroflexion       views. Impression:               - Diverticulosis in the entire examined colon.                           - One 5 mm polyp at the hepatic flexure, removed  with a cold snare. Resected and retrieved.                           - Internal hemorrhoids.                           - The examination was otherwise normal on direct                            and retroflexion views. Moderate Sedation:      Moderate (conscious) sedation was personally administered by an       anesthesia professional. The following parameters were monitored: oxygen       saturation, heart rate, blood pressure, respiratory rate, EKG, adequacy       of pulmonary ventilation, and response to care. Total physician       intraservice time was 23 minutes. Recommendation:           - Patient has a  contact number available for                            emergencies. The signs and symptoms of potential                            delayed complications were discussed with the                            patient. Return to normal activities tomorrow.                            Written discharge instructions were provided to the                            patient.                           - Resume previous diet.                           - Continue present medications.                           - Await pathology results.                           - Repeat colonoscopy date to be determined after                            pending pathology results are reviewed for                            surveillance based on pathology results.                           - Return to GI office (date not yet determined). Procedure Code(s):        --- Professional ---  18343, 51, Colonoscopy, flexible; with removal of                            tumor(s), polyp(s), or other lesion(s) by snare                            technique Diagnosis Code(s):        --- Professional ---                           K64.8, Other hemorrhoids                           D12.3, Benign neoplasm of transverse colon (hepatic                            flexure or splenic flexure)                           K92.1, Melena (includes Hematochezia)                           K57.30, Diverticulosis of large intestine without                            perforation or abscess without bleeding CPT copyright 2017 American Medical Association. All rights reserved. The codes documented in this report are preliminary and upon coder review may  be revised to meet current compliance requirements. Cristopher Estimable. Prateek Knipple, MD Norvel Richards, MD 07/26/2018 10:19:53 AM This report has been signed electronically. Number of Addenda: 0

## 2018-07-26 NOTE — Anesthesia Preprocedure Evaluation (Signed)
Anesthesia Evaluation  Patient identified by MRN, date of birth, ID band Patient awake    Reviewed: Allergy & Precautions, H&P , NPO status , Patient's Chart, lab work & pertinent test results, reviewed documented beta blocker date and time   Airway Mallampati: II  TM Distance: >3 FB Neck ROM: full    Dental no notable dental hx. (+) Edentulous Upper, Dental Advidsory Given   Pulmonary neg pulmonary ROS, shortness of breath, sleep apnea , former smoker,    Pulmonary exam normal breath sounds clear to auscultation       Cardiovascular Exercise Tolerance: Good hypertension, On Medications negative cardio ROS   Rhythm:regular Rate:Normal     Neuro/Psych PSYCHIATRIC DISORDERS Depression CVA negative neurological ROS  negative psych ROS   GI/Hepatic negative GI ROS, Neg liver ROS, GERD  ,  Endo/Other  negative endocrine ROSdiabetesHypothyroidism   Renal/GU negative Renal ROS  negative genitourinary   Musculoskeletal   Abdominal   Peds  Hematology negative hematology ROS (+)   Anesthesia Other Findings CVA around 4/18  No motor residual, some speech diff  Reproductive/Obstetrics negative OB ROS                             Anesthesia Physical Anesthesia Plan  ASA: III  Anesthesia Plan: MAC   Post-op Pain Management:    Induction:   PONV Risk Score and Plan:   Airway Management Planned:   Additional Equipment:   Intra-op Plan:   Post-operative Plan:   Informed Consent: I have reviewed the patients History and Physical, chart, labs and discussed the procedure including the risks, benefits and alternatives for the proposed anesthesia with the patient or authorized representative who has indicated his/her understanding and acceptance.   Dental Advisory Given  Plan Discussed with: CRNA and Anesthesiologist  Anesthesia Plan Comments:         Anesthesia Quick Evaluation

## 2018-07-26 NOTE — Anesthesia Postprocedure Evaluation (Signed)
Anesthesia Post Note  Patient: Brittany Goodman  Procedure(s) Performed: COLONOSCOPY WITH PROPOFOL (N/A ) POLYPECTOMY  Patient location during evaluation: PACU Anesthesia Type: MAC Level of consciousness: awake and alert and oriented Pain management: pain level controlled Vital Signs Assessment: post-procedure vital signs reviewed and stable Respiratory status: spontaneous breathing Cardiovascular status: stable and blood pressure returned to baseline Postop Assessment: no apparent nausea or vomiting Anesthetic complications: no     Last Vitals:  Vitals:   07/26/18 0900 07/26/18 0905  BP:    Pulse:    Resp: 15 15  Temp:    SpO2: 96% 100%    Last Pain:  Vitals:   07/26/18 0942  TempSrc:   PainSc: 0-No pain                 Kymani Laursen

## 2018-07-26 NOTE — H&P (Signed)
@LOGO @   Primary Care Physician:  [name], MD Primary Gastroenterologist:  Dr.   Pre-Procedure History & Physical: HPI:  Brittany Goodman is a 72 y.o. female here  for further evaluation of self-limiting rectal bleeding via colonoscopy.  Past Medical History:  Diagnosis Date  . Arthritis   . Depression   . Diabetes mellitus type II   . Diverticulosis   . GERD (gastroesophageal reflux disease)   . Hyperlipidemia   . Hypertension   . Hypothyroidism   . Sepsis ([place])    2011, Escherichia coli pyelonephritis  . Sleep apnea    Does not use CPAP. Cannot tolerate  . Spinal stenosis   . Stroke [facility])    no deficits    Past Surgical History:  Procedure Laterality Date  . BACK SURGERY    . CARPAL TUNNEL RELEASE Right   . COLONOSCOPY N/A 05/29/2013   Focal left colonic inflammation, likely remnant of recent bout of ischemic or segmental colitis, s/p biopsy. pancolonic diverticulosis. Due for surveillance in 5 years  . ESOPHAGOGASTRODUODENOSCOPY (EGD) WITH ESOPHAGEAL DILATION N/A 05/29/2013   normal appearing patent tubular esophagus, small hiatal hernia, multiple antral erosions. no ulcer. normlal duodenum. empiric dilation  . ILIOTIBIAL BAND RELEASE Left   . TOTAL ABDOMINAL HYSTERECTOMY      Prior to Admission medications   Medication Sig Start Date End Date Taking? Authorizing Provider  acetaminophen (TYLENOL) 500 MG tablet Take 500 mg by mouth 2 (two) times daily.   Yes [provider]  Ascorbic Acid (VITAMIN C) 1000 MG tablet Take 1,000-2,000 mg by mouth daily.    Yes [provider]  aspirin-acetaminophen-caffeine (EXCEDRIN MIGRAINE) [phone] MG tablet Take 1 tablet by mouth 2 (two) times daily.   Yes [provider]  atenolol (TENORMIN) 100 MG tablet Take 100 mg by mouth daily.     Yes [provider]  Biotin 5000 MCG TABS Take 1 tablet by mouth daily.   Yes [provider]  Cholecalciferol (VITAMIN D) 2000 units CAPS Take 1  capsule by mouth daily.   Yes [provider]  citalopram (CELEXA) 20 MG tablet Take 20 mg by mouth every other day.    Yes [provider]  cyclobenzaprine (FLEXERIL) 10 MG tablet Take 10 mg by mouth 2 (two) times daily as needed for muscle spasms.    Yes [provider]  folic acid (FOLVITE) 381 MCG tablet Take 800 mcg by mouth daily.   Yes [provider]  glimepiride (AMARYL) 1 MG tablet Take 1 mg by mouth daily before breakfast.  04/23/13  Yes [provider]  HYDROcodone-acetaminophen (NORCO/VICODIN) 5-325 MG tablet Take 1 tablet by mouth every 4 (four) hours as needed for moderate pain.  03/10/17  Yes [provider]  levothyroxine (SYNTHROID, LEVOTHROID) 88 MCG tablet Take 88 mcg by mouth daily before breakfast.  01/18/17  Yes [provider]  lisinopril (PRINIVIL,ZESTRIL) 5 MG tablet Take 5 mg by mouth daily.  03/09/13  Yes [provider]  loratadine (CLARITIN) 10 MG tablet Take 10 mg by mouth daily.   Yes [provider]  metFORMIN (GLUCOPHAGE) 1000 MG tablet Take 1,000 mg by mouth 2 (two) times daily with a meal.  12/01/16  Yes [provider]  omeprazole (PRILOSEC) 20 MG capsule Take 20 mg by mouth daily.    Yes [provider]  vitamin B-12 (CYANOCOBALAMIN) 1000 MCG tablet Take 1,000 mcg by mouth daily.   Yes [provider]  NONFORMULARY OR COMPOUNDED  ITEM Apply 1 application topically 3 (three) times daily as needed (Pain). Diclofenac, Baclofen, Gabapentin, Lidocaine, Menthol - Compounded at [facility], Historical, MD    Allergies as of 05/22/2018 - Review Complete 05/22/2018  Allergen Reaction Noted  . Codeine Other (See Comments) 08/04/2009  . Dilaudid [hydromorphone hcl]  05/06/2013  . Morphine  08/04/2009  . Tetracycline  08/04/2009    Family History  Problem Relation Age of Onset  . Heart disease Father   . Heart attack Father 6  . Heart disease  Brother   . Colon polyps Brother   . Liver cancer Brother 75       deceased , ?metastatic cancer?   . Colon cancer Neg Hx     Social History   Socioeconomic History  . Marital status: Widowed    Spouse name: Not on file  . Number of children: 2  . Years of education: GED  . Highest education level: Not on file  Occupational History  . 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 Financial resource strain: Not on file  . Food insecurity:    Worry: Not on file    Inability: Not on file  . Transportation needs:    Medical: Not on file    Non-medical: Not on file  Tobacco Use  . Smoking status: Former Smoker    Packs/day: 2.00    Years: 13.00    Pack years: 26.00    Last attempt to quit: 07/19/1966    Years since quitting: 52.0  . Smokeless tobacco: Former Systems developer    Types: Chew  Substance and Sexual Activity  . Alcohol use: No  . Drug use: No  . Sexual activity: Not Currently    Birth control/protection: Surgical  Lifestyle  . Physical activity:    Days per week: Not on file    Minutes per session: Not on file  . Stress: Not on file  Relationships  . Social connections:    Talks on phone: Not on file    Gets together: Not on file    Attends religious service: Not on file    Active member of club or organization: Not on file    Attends meetings of clubs or organizations: Not on file    Relationship status: Not on file  . Intimate partner violence:    Fear of current or ex partner: Not on file    Emotionally abused: Not on file    Physically abused: Not on file    Forced sexual activity: Not on file  Other Topics Concern  . Not on file  Social History Narrative   Lives with son   Caffeine use: Coffee daily   Right handed   No regular exercise    Review of Systems: See HPI, otherwise negative ROS  Physical Exam: BP (!) 150/91   Pulse 68   Temp 97.7 F (36.5 C) (Oral)   Resp 15   SpO2 100%  General:   Alert,   Well-developed, well-nourished, pleasant and cooperative in NAD Neck:  Supple; no masses or thyromegaly. No significant cervical adenopathy. Lungs:  Clear throughout to auscultation.   No wheezes, crackles, or rhonchi. No acute distress. Heart:  Regular rate and rhythm; no murmurs, clicks, rubs,  or gallops. Abdomen: Non-distended, normal bowel sounds.  Soft and nontender without appreciable mass or hepatosplenomegaly.  Pulses:  Normal pulses noted. Extremities:  Without clubbing or edema.  Impression/Plan:  73 year old lady with  rectal bleeding.  Apparently self limiting. Colonoscopy now begin to further evaluate. The risks, benefits, limitations, alternatives and imponderables have been reviewed with the patient. Questions have been answered. All parties are agreeable.     Notice: This dictation was prepared with Dragon dictation along with smaller phrase technology. Any transcriptional errors that result from this process are unintentional and may not be corrected upon review.

## 2018-07-29 ENCOUNTER — Encounter: Payer: Self-pay | Admitting: Internal Medicine

## 2018-07-31 ENCOUNTER — Encounter (HOSPITAL_COMMUNITY): Payer: Self-pay | Admitting: Internal Medicine

## 2018-08-09 DIAGNOSIS — M4186 Other forms of scoliosis, lumbar region: Secondary | ICD-10-CM | POA: Diagnosis not present

## 2018-08-09 DIAGNOSIS — M5136 Other intervertebral disc degeneration, lumbar region: Secondary | ICD-10-CM | POA: Diagnosis not present

## 2018-08-09 DIAGNOSIS — M48061 Spinal stenosis, lumbar region without neurogenic claudication: Secondary | ICD-10-CM | POA: Diagnosis not present

## 2018-08-29 DIAGNOSIS — M5412 Radiculopathy, cervical region: Secondary | ICD-10-CM | POA: Diagnosis not present

## 2018-08-29 DIAGNOSIS — G8929 Other chronic pain: Secondary | ICD-10-CM | POA: Diagnosis not present

## 2018-08-29 DIAGNOSIS — Z1389 Encounter for screening for other disorder: Secondary | ICD-10-CM | POA: Diagnosis not present

## 2018-08-29 DIAGNOSIS — Z6830 Body mass index (BMI) 30.0-30.9, adult: Secondary | ICD-10-CM | POA: Diagnosis not present

## 2018-08-29 DIAGNOSIS — Z23 Encounter for immunization: Secondary | ICD-10-CM | POA: Diagnosis not present

## 2018-08-29 DIAGNOSIS — R221 Localized swelling, mass and lump, neck: Secondary | ICD-10-CM | POA: Diagnosis not present

## 2018-08-29 DIAGNOSIS — M5136 Other intervertebral disc degeneration, lumbar region: Secondary | ICD-10-CM | POA: Diagnosis not present

## 2018-08-31 DIAGNOSIS — E6609 Other obesity due to excess calories: Secondary | ICD-10-CM | POA: Diagnosis not present

## 2018-08-31 DIAGNOSIS — R221 Localized swelling, mass and lump, neck: Secondary | ICD-10-CM | POA: Diagnosis not present

## 2018-08-31 DIAGNOSIS — Z23 Encounter for immunization: Secondary | ICD-10-CM | POA: Diagnosis not present

## 2018-08-31 DIAGNOSIS — T148XXD Other injury of unspecified body region, subsequent encounter: Secondary | ICD-10-CM | POA: Diagnosis not present

## 2018-08-31 DIAGNOSIS — Z6830 Body mass index (BMI) 30.0-30.9, adult: Secondary | ICD-10-CM | POA: Diagnosis not present

## 2018-09-01 ENCOUNTER — Emergency Department (HOSPITAL_COMMUNITY)
Admission: EM | Admit: 2018-09-01 | Discharge: 2018-09-01 | Disposition: A | Discharge status: Home / Self Care | Payer: Medicare Other | Attending: Emergency Medicine | Admitting: Emergency Medicine

## 2018-09-01 ENCOUNTER — Emergency Department (HOSPITAL_COMMUNITY): Payer: Medicare Other

## 2018-09-01 ENCOUNTER — Encounter (HOSPITAL_COMMUNITY): Payer: Self-pay | Admitting: Emergency Medicine

## 2018-09-01 ENCOUNTER — Other Ambulatory Visit: Payer: Self-pay

## 2018-09-01 DIAGNOSIS — E039 Hypothyroidism, unspecified: Secondary | ICD-10-CM | POA: Diagnosis not present

## 2018-09-01 DIAGNOSIS — Z7984 Long term (current) use of oral hypoglycemic drugs: Secondary | ICD-10-CM | POA: Insufficient documentation

## 2018-09-01 DIAGNOSIS — Z8673 Personal history of transient ischemic attack (TIA), and cerebral infarction without residual deficits: Secondary | ICD-10-CM | POA: Diagnosis not present

## 2018-09-01 DIAGNOSIS — Z79899 Other long term (current) drug therapy: Secondary | ICD-10-CM | POA: Insufficient documentation

## 2018-09-01 DIAGNOSIS — I10 Essential (primary) hypertension: Secondary | ICD-10-CM | POA: Insufficient documentation

## 2018-09-01 DIAGNOSIS — E119 Type 2 diabetes mellitus without complications: Secondary | ICD-10-CM | POA: Diagnosis not present

## 2018-09-01 DIAGNOSIS — S20212A Contusion of left front wall of thorax, initial encounter: Secondary | ICD-10-CM | POA: Diagnosis not present

## 2018-09-01 DIAGNOSIS — Z7982 Long term (current) use of aspirin: Secondary | ICD-10-CM | POA: Diagnosis not present

## 2018-09-01 DIAGNOSIS — Z87891 Personal history of nicotine dependence: Secondary | ICD-10-CM | POA: Insufficient documentation

## 2018-09-01 DIAGNOSIS — R911 Solitary pulmonary nodule: Secondary | ICD-10-CM | POA: Diagnosis not present

## 2018-09-01 DIAGNOSIS — R918 Other nonspecific abnormal finding of lung field: Secondary | ICD-10-CM | POA: Insufficient documentation

## 2018-09-01 DIAGNOSIS — M7981 Nontraumatic hematoma of soft tissue: Secondary | ICD-10-CM | POA: Diagnosis present

## 2018-09-01 DIAGNOSIS — E871 Hypo-osmolality and hyponatremia: Secondary | ICD-10-CM | POA: Diagnosis not present

## 2018-09-01 LAB — CBC WITH DIFFERENTIAL/PLATELET
Abs Immature Granulocytes: 0.03 10*3/uL (ref 0.00–0.07)
BASOS ABS: 0.1 10*3/uL (ref 0.0–0.1)
Basophils Relative: 1 %
EOS ABS: 0.2 10*3/uL (ref 0.0–0.5)
EOS PCT: 3 %
HCT: 35.2 % — ABNORMAL LOW (ref 36.0–46.0)
Hemoglobin: 11.2 g/dL — ABNORMAL LOW (ref 12.0–15.0)
IMMATURE GRANULOCYTES: 0 %
LYMPHS ABS: 2.6 10*3/uL (ref 0.7–4.0)
Lymphocytes Relative: 33 %
MCH: 26.7 pg (ref 26.0–34.0)
MCHC: 31.8 g/dL (ref 30.0–36.0)
MCV: 84 fL (ref 80.0–100.0)
Monocytes Absolute: 0.5 10*3/uL (ref 0.1–1.0)
Monocytes Relative: 7 %
NEUTROS PCT: 56 %
NRBC: 0 % (ref 0.0–0.2)
Neutro Abs: 4.2 10*3/uL (ref 1.7–7.7)
PLATELETS: 295 10*3/uL (ref 150–400)
RBC: 4.19 MIL/uL (ref 3.87–5.11)
RDW: 14.8 % (ref 11.5–15.5)
WBC: 7.6 10*3/uL (ref 4.0–10.5)

## 2018-09-01 LAB — COMPREHENSIVE METABOLIC PANEL
ALBUMIN: 3.8 g/dL (ref 3.5–5.0)
ALT: 21 U/L (ref 0–44)
ANION GAP: 11 (ref 5–15)
AST: 22 U/L (ref 15–41)
Alkaline Phosphatase: 46 U/L (ref 38–126)
BUN: 18 mg/dL (ref 8–23)
CO2: 22 mmol/L (ref 22–32)
Calcium: 9 mg/dL (ref 8.9–10.3)
Chloride: 93 mmol/L — ABNORMAL LOW (ref 98–111)
Creatinine, Ser: 0.75 mg/dL (ref 0.44–1.00)
GFR calc Af Amer: >60 mL/min (ref >60)
GFR calc non Af Amer: >60 mL/min (ref >60)
GLUCOSE: 91 mg/dL (ref 70–99)
POTASSIUM: 4.5 mmol/L (ref 3.5–5.1)
Sodium: 126 mmol/L — ABNORMAL LOW (ref 135–145)
TOTAL PROTEIN: 7.1 g/dL (ref 6.5–8.1)
Total Bilirubin: 0.4 mg/dL (ref 0.3–1.2)

## 2018-09-01 LAB — PROTIME-INR
INR: 0.91
Prothrombin Time: 12.1 seconds (ref 11.4–15.2)

## 2018-09-01 MED ORDER — SODIUM CHLORIDE 0.9 % IV BOLUS
1000.0000 mL | Freq: Once | INTRAVENOUS | Status: AC
  Administered 2018-09-01: 1000 mL via INTRAVENOUS

## 2018-09-01 MED ORDER — IOHEXOL 300 MG/ML  SOLN
75.0000 mL | Freq: Once | INTRAMUSCULAR | Status: AC | PRN
  Administered 2018-09-01: 75 mL via INTRAVENOUS

## 2018-09-01 NOTE — Discharge Instructions (Addendum)
Stop taking aspirin or Excedrin.  Take Tylenol for pain.  Follow-up with your family doctor this week so he can decide how to look into the area in your lung that was seen on CT

## 2018-09-01 NOTE — ED Triage Notes (Signed)
Pt reports hematoma to left side of neck/shoulder area Monday night. Went to PCP on Tuesday. Bruising has spread down to left breast and side. Pt states also had some tingling in tongue yesterday for approximately two minutes. Denies other symptoms or pain.

## 2018-09-01 NOTE — ED Provider Notes (Signed)
[facility] EMERGENCY DEPARTMENT Provider Note   CSN: [id number] Arrival date & time: 09/01/18  1802     History   Chief Complaint Chief Complaint  Patient presents with  . Bleeding/Bruising    HPI Brittany Goodman is a 72 y.o. female.  Patient presents with bruising to her left chest.  She takes aspirin daily.  No history of any recent trauma  The history is provided by the patient. No language interpreter was used.  Illness  This is a new problem. The current episode started more than 2 days ago. The problem occurs constantly. The problem has not changed since onset.Pertinent negatives include no chest pain, no abdominal pain and no headaches. Nothing aggravates the symptoms. Nothing relieves the symptoms. She has tried nothing for the symptoms. The treatment provided no relief.    Past Medical History:  Diagnosis Date  . Arthritis   . Depression   . Diabetes mellitus type II   . Diverticulosis   . GERD (gastroesophageal reflux disease)   . Hyperlipidemia   . Hypertension   . Hypothyroidism   . Sepsis ([place])    2011, Escherichia coli pyelonephritis  . Sleep apnea    Does not use CPAP. Cannot tolerate  . Spinal stenosis   . Stroke [facility])    no deficits    Patient Active Problem List   Diagnosis Date Noted  . Abdominal pain, left lower quadrant 05/22/2018  . Rectal bleeding 05/22/2018  . Weakness due to cerebrovascular accident (CVA) 02/28/2017  . Cerebrovascular disease 02/27/2017  . RUQ pain 05/06/2013  . Abdominal pain, epigastric 05/06/2013  . Esophageal dysphagia 05/06/2013  . GERD (gastroesophageal reflux disease) 05/06/2013  . Family history of polyps in the colon 05/06/2013  . DYSPNEA 09/01/2009  . MIXED HYPERLIPIDEMIA 08/05/2009  . HYPERTENSION, BENIGN ESSENTIAL 08/05/2009  . PRECORDIAL PAIN 08/05/2009    Past Surgical History:  Procedure Laterality Date  . BACK SURGERY    . CARPAL TUNNEL RELEASE Right   . COLONOSCOPY N/A 05/29/2013   Focal left  colonic inflammation, likely remnant of recent bout of ischemic or segmental colitis, s/p biopsy. pancolonic diverticulosis. Due for surveillance in 5 years  . COLONOSCOPY WITH PROPOFOL N/A 07/26/2018   Procedure: COLONOSCOPY WITH PROPOFOL;  Surgeon: Daneil Dolin, MD;  Location: [facility] ENDO SUITE;  Service: Endoscopy;  Laterality: N/A;  8:30am  . ESOPHAGOGASTRODUODENOSCOPY (EGD) WITH ESOPHAGEAL DILATION N/A 05/29/2013   normal appearing patent tubular esophagus, small hiatal hernia, multiple antral erosions. no ulcer. normlal duodenum. empiric dilation  . ILIOTIBIAL BAND RELEASE Left   . POLYPECTOMY  07/26/2018   Procedure: POLYPECTOMY;  Surgeon: Daneil Dolin, MD;  Location: [facility] ENDO SUITE;  Service: Endoscopy;;  hepatic flexure polyp  . TOTAL ABDOMINAL HYSTERECTOMY       OB History   None      Home Medications    Prior to Admission medications   Medication Sig Start Date End Date Taking? Authorizing Provider  acetaminophen (TYLENOL) 500 MG tablet Take 500 mg by mouth 2 (two) times daily.   Yes [provider]  Ascorbic Acid (VITAMIN C) 1000 MG tablet Take 1,000-2,000 mg by mouth daily.    Yes [provider]  aspirin-acetaminophen-caffeine (EXCEDRIN MIGRAINE) [phone] MG tablet Take 1 tablet by mouth 2 (two) times daily.   Yes [provider]  atenolol (TENORMIN) 100 MG tablet Take 100 mg by mouth daily.     Yes [provider]  Biotin 5000 MCG TABS Take 1 tablet by  mouth daily.   Yes [provider]  Cholecalciferol (VITAMIN D) 2000 units CAPS Take 1 capsule by mouth daily.   Yes [provider]  citalopram (CELEXA) 20 MG tablet Take 20 mg by mouth every other day.    Yes [provider]  cyclobenzaprine (FLEXERIL) 10 MG tablet Take 10 mg by mouth 2 (two) times daily as needed for muscle spasms.    Yes [provider]  folic acid (FOLVITE) 974 MCG tablet Take 800 mcg by mouth daily.   Yes [provider]    glimepiride (AMARYL) 1 MG tablet Take 1 mg by mouth daily before breakfast.  04/23/13  Yes [provider]  HYDROcodone-acetaminophen (NORCO/VICODIN) 5-325 MG tablet Take 1 tablet by mouth every 4 (four) hours as needed for moderate pain.  03/10/17  Yes [provider]  levothyroxine (SYNTHROID, LEVOTHROID) 88 MCG tablet Take 88 mcg by mouth daily before breakfast.  01/18/17  Yes [provider]  lisinopril (PRINIVIL,ZESTRIL) 5 MG tablet Take 5 mg by mouth daily.  03/09/13  Yes [provider]  loratadine (CLARITIN) 10 MG tablet Take 10 mg by mouth daily.   Yes [provider]  metFORMIN (GLUCOPHAGE) 1000 MG tablet Take 1,000 mg by mouth 2 (two) times daily with a meal.  12/01/16  Yes [provider]  NONFORMULARY OR COMPOUNDED ITEM Apply 1 application topically 3 (three) times daily as needed (Pain). Diclofenac, Baclofen, Gabapentin, Lidocaine, Menthol - [facility]   Yes [provider]  omeprazole (PRILOSEC) 20 MG capsule Take 20 mg by mouth daily.    Yes [provider]  vitamin B-12 (CYANOCOBALAMIN) 1000 MCG tablet Take 1,000 mcg by mouth daily.   Yes [provider]    Family History Family History  Problem Relation Age of Onset  . Heart disease Father   . Heart attack Father 31  . Heart disease Brother   . Colon polyps Brother   . Liver cancer Brother 105       deceased , ?metastatic cancer?   . Colon cancer Neg Hx     Social History Social History   Tobacco Use  . Smoking status: Former Smoker    Packs/day: 2.00    Years: 13.00    Pack years: 26.00    Last attempt to quit: 07/19/1966    Years since quitting: 52.1  . Smokeless tobacco: Former Systems developer    Types: Chew  Substance Use Topics  . Alcohol use: No  . Drug use: No     Allergies   Codeine; Dilaudid [hydromorphone hcl]; Morphine; and Tetracycline   Review of Systems Review of Systems  Constitutional: Negative for  appetite change and fatigue.  HENT: Negative for congestion, ear discharge and sinus pressure.   Eyes: Negative for discharge.  Respiratory: Negative for cough.        Bruising to chest wall  Cardiovascular: Negative for chest pain.  Gastrointestinal: Negative for abdominal pain and diarrhea.  Genitourinary: Negative for frequency and hematuria.  Musculoskeletal: Negative for back pain.  Skin: Negative for rash.  Neurological: Negative for seizures and headaches.  Psychiatric/Behavioral: Negative for hallucinations.     Physical Exam Updated Vital Signs BP (!) 168/92   Pulse 70   Temp 98.3 F (36.8 C) (Oral)   Resp 16   SpO2 96%   Physical Exam  Constitutional: She is oriented to person, place, and time. She appears well-developed.  HENT:  Head: Normocephalic.  Eyes: Conjunctivae and EOM are normal.  No scleral icterus.  Neck: Neck supple. No thyromegaly present.  Cardiovascular: Normal rate and regular rhythm. Exam reveals no gallop and no friction rub.  No murmur heard. Pulmonary/Chest: No stridor. She has no wheezes. She has no rales. She exhibits no tenderness.  Abdominal: She exhibits no distension. There is no tenderness. There is no rebound.  Musculoskeletal: Normal range of motion. She exhibits no edema.  Patient with significant bruising to left lateral chest and superior chest  Lymphadenopathy:    She has no cervical adenopathy.  Neurological: She is oriented to person, place, and time. She exhibits normal muscle tone. Coordination normal.  Skin: No rash noted. No erythema.  Psychiatric: She has a normal mood and affect. Her behavior is normal.     ED Treatments / Results  Labs (all labs ordered are listed, but only abnormal results are displayed) Labs Reviewed  CBC WITH DIFFERENTIAL/PLATELET - Abnormal; Notable for the following components:      Result Value   Hemoglobin 11.2 (*)    HCT 35.2 (*)    All other components within normal limits  COMPREHENSIVE  METABOLIC PANEL - Abnormal; Notable for the following components:   Sodium 126 (*)    Chloride 93 (*)    All other components within normal limits  PROTIME-INR    EKG None  Radiology Ct Chest W Contrast  Result Date: 09/01/2018 CLINICAL DATA:  Left shoulder hematoma.  No known injury. EXAM: CT CHEST WITH CONTRAST TECHNIQUE: Multidetector CT imaging of the chest was performed during intravenous contrast administration. CONTRAST:  5m OMNIPAQUE IOHEXOL 300 MG/ML  SOLN COMPARISON:  CT chest 03/20/2014 FINDINGS: Cardiovascular: Normal heart size. Coronary arterial vascular calcifications. Thoracic aortic vascular calcifications. Mediastinum/Nodes: No enlarged axillary, mediastinal or hilar lymphadenopathy. Normal appearance of the esophagus. Lungs/Pleura: Central airways are patent. Dependent atelectasis/scarring within the bilateral lower lobes. 1.7 x 1.2 cm irregular sub solid nodule left lung apex (image 21; series 4), previously measuring 0.7 x 1.0 cm. No large area of pulmonary consolidation. No pleural effusion or pneumothorax. Upper Abdomen: No acute process. Musculoskeletal: Thoracic spine degenerative changes. No aggressive or acute appearing osseous lesions. IMPRESSION: 1. There is a 1.7 cm enlarging sub solid nodule within the left lung apex. Possibility of slow growing malignancy should be considered. Recommend multi disciplinary thoracic oncology consultation. 2. No acute process within the chest. thank 3. Aortic Atherosclerosis (ICD10-I70.0). Electronically Signed   By: DLovey NewcomerM.D.   On: 09/01/2018 20:33    Procedures Procedures (including critical care time)  Medications Ordered in ED Medications  iohexol (OMNIPAQUE) 300 MG/ML solution 75 mL (75 mLs Intravenous Contrast Given 09/01/18 1947)  sodium chloride 0.9 % bolus 1,000 mL (1,000 mLs Intravenous New Bag/Given 09/01/18 2012)     Initial Impression / Assessment and Plan / ED Course  I have reviewed the triage vital  signs and the nursing notes.  Pertinent labs & imaging results that were available during my care of the patient were reviewed by me and considered in my medical decision making (see chart for details).     Labs show some hyponatremia.  Also CT scan shows a 1.7 cm lung mass.  Patient was instructed to follow-up with her primary care doctor this week for continued evaluation of this lung mass.  She was also told to stop taking aspirin  Final Clinical Impressions(s) / ED Diagnoses   Final diagnoses:  Lung mass    ED Discharge Orders    None  Milton Ferguson, MD 09/01/18 2127

## 2018-09-13 DIAGNOSIS — S199XXA Unspecified injury of neck, initial encounter: Secondary | ICD-10-CM | POA: Diagnosis not present

## 2018-09-13 DIAGNOSIS — R600 Localized edema: Secondary | ICD-10-CM | POA: Diagnosis not present

## 2018-09-13 DIAGNOSIS — M50322 Other cervical disc degeneration at C5-C6 level: Secondary | ICD-10-CM | POA: Diagnosis not present

## 2018-09-13 DIAGNOSIS — M5412 Radiculopathy, cervical region: Secondary | ICD-10-CM | POA: Diagnosis not present

## 2018-09-13 DIAGNOSIS — M50323 Other cervical disc degeneration at C6-C7 level: Secondary | ICD-10-CM | POA: Diagnosis not present

## 2018-09-13 DIAGNOSIS — M4802 Spinal stenosis, cervical region: Secondary | ICD-10-CM | POA: Diagnosis not present

## 2018-09-13 DIAGNOSIS — M50321 Other cervical disc degeneration at C4-C5 level: Secondary | ICD-10-CM | POA: Diagnosis not present

## 2018-09-13 DIAGNOSIS — S20212A Contusion of left front wall of thorax, initial encounter: Secondary | ICD-10-CM | POA: Diagnosis not present

## 2018-09-13 DIAGNOSIS — M5031 Other cervical disc degeneration,  high cervical region: Secondary | ICD-10-CM | POA: Diagnosis not present

## 2018-09-13 DIAGNOSIS — R221 Localized swelling, mass and lump, neck: Secondary | ICD-10-CM | POA: Diagnosis not present

## 2018-09-14 DIAGNOSIS — G2581 Restless legs syndrome: Secondary | ICD-10-CM | POA: Diagnosis not present

## 2018-09-14 DIAGNOSIS — Z6830 Body mass index (BMI) 30.0-30.9, adult: Secondary | ICD-10-CM | POA: Diagnosis not present

## 2018-09-14 DIAGNOSIS — E538 Deficiency of other specified B group vitamins: Secondary | ICD-10-CM | POA: Diagnosis not present

## 2018-09-14 DIAGNOSIS — I10 Essential (primary) hypertension: Secondary | ICD-10-CM | POA: Diagnosis not present

## 2018-09-14 DIAGNOSIS — E119 Type 2 diabetes mellitus without complications: Secondary | ICD-10-CM | POA: Diagnosis not present

## 2018-09-14 DIAGNOSIS — E039 Hypothyroidism, unspecified: Secondary | ICD-10-CM | POA: Diagnosis not present

## 2018-09-26 DIAGNOSIS — M4150 Other secondary scoliosis, site unspecified: Secondary | ICD-10-CM | POA: Diagnosis not present

## 2018-09-26 DIAGNOSIS — M5416 Radiculopathy, lumbar region: Secondary | ICD-10-CM | POA: Diagnosis not present

## 2018-10-03 DIAGNOSIS — H6123 Impacted cerumen, bilateral: Secondary | ICD-10-CM | POA: Diagnosis not present

## 2018-10-08 NOTE — Progress Notes (Signed)
[address]       [place] [address]             [phone]                    Brittany Goodman  Medical Record #[id number] Date of Birth: 11/09/1946  Referring: [name], MD Primary Care: [name], MD Primary Cardiologist: No primary care provider on file.  Chief Complaint:    Chief Complaint  Patient presents with  . Lung Lesion    LULobe with CT CHEST 09/01/18    History of Present Illness:    Brittany Goodman 72 y.o. female is seen in the office  today for evaluation of left upper lobe lung abnormality.  Most recently the patient had a CT scan of the chest done on September 01, 2018.  This was precipitated by a visit to the emergency room when she noted extensive bruising along her left neck left anterior chest and down along the left side of her chest.  She denies any specific trauma.  She had been taking high-dose aspirin at.  This was treated without any specific intervention other than discontinuing the aspirin.  Incidentally a CT scan suggested an irregular mass in the left upper lobe.  She had a previous history of a left upper lobe mass that she says was followed for several years with CT scan.  On review of her history and films a CT scan from 2015 was located.  The report on that scan does not mention left upper lobe irregularity but on review of the film there is an area of irregular mass in the area of question that persists now.  It does appear slightly enlarged over 4 years.   Patient is a distant smoker having smoked for approximately 10 years and quitting in 1973.  She denies any history of other malignancy.  She has had chronic back pain with numerous previous interventions, she is currently being evaluated for neck surgery because of chronic pain.      Current Activity/ Functional Status:  Patient is independent with mobility/ambulation, transfers, ADL's, IADL's.   Zubrod Score: At the time of surgery this patient's  most appropriate activity status/level should be described as: []     0    Normal activity, no symptoms [x]     1    Restricted in physical strenuous activity but ambulatory, able to do out light work []     2    Ambulatory and capable of self care, unable to do work activities, up and about               >50 % of waking hours                              []     3    Only limited self care, in bed greater than 50% of waking hours []     4    Completely disabled, no self care, confined to bed or chair []     5    Moribund   Past Medical History:  Diagnosis Date  . Arthritis   . Depression   . Diabetes mellitus type II   . Diverticulosis   . GERD (gastroesophageal reflux disease)   . Hyperlipidemia   . Hypertension   . Hypothyroidism   . Sepsis ([place])    2011, Escherichia coli pyelonephritis  .  Sleep apnea    Does not use CPAP. Cannot tolerate  . Spinal stenosis   . Stroke [facility])    no deficits    Past Surgical History:  Procedure Laterality Date  . BACK SURGERY    . CARPAL TUNNEL RELEASE Right   . COLONOSCOPY N/A 05/29/2013   Focal left colonic inflammation, likely remnant of recent bout of ischemic or segmental colitis, s/p biopsy. pancolonic diverticulosis. Due for surveillance in 5 years  . COLONOSCOPY WITH PROPOFOL N/A 07/26/2018   Procedure: COLONOSCOPY WITH PROPOFOL;  Surgeon: Daneil Dolin, MD;  Location: [facility];  Service: Endoscopy;  Laterality: N/A;  8:30am  . ESOPHAGOGASTRODUODENOSCOPY (EGD) WITH ESOPHAGEAL DILATION N/A 05/29/2013   normal appearing patent tubular esophagus, small hiatal hernia, multiple antral erosions. no ulcer. normlal duodenum. empiric dilation  . ILIOTIBIAL BAND RELEASE Left   . POLYPECTOMY  07/26/2018   Procedure: POLYPECTOMY;  Surgeon: Daneil Dolin, MD;  Location: [facility];  Service: Endoscopy;;  hepatic flexure polyp  . TOTAL ABDOMINAL HYSTERECTOMY      Family History  Problem Relation Age of Onset  . Heart disease Mother        MI  .  Heart disease Father   . Heart attack Father 53  . Heart disease Brother   . Cancer Brother        ADRENAL GLAND  . Colon polyps Brother   . Liver cancer Brother 89       deceased , ?metastatic cancer?   . Heart disease Maternal Grandfather        MI  . Cancer Paternal Grandfather        LUNG  . Colon cancer Neg Hx      Social History   Tobacco Use  Smoking Status Former Smoker  . Packs/day: 2.00  . Years: 13.00  . Pack years: 26.00  . Last attempt to quit: 07/19/1966  . Years since quitting: 52.2  Smokeless Tobacco Former Systems developer  . Types: Chew    Social History   Substance and Sexual Activity  Alcohol Use No     Allergies  Allergen Reactions  . Codeine Other (See Comments)    "makes me feel bad"  . Dilaudid [Hydromorphone Hcl] Other (See Comments)    hallucination  . Morphine     vomiting  . Tetracycline     vomiting    Current Outpatient Medications  Medication Sig Dispense Refill  . acetaminophen (TYLENOL) 500 MG tablet Take 500 mg by mouth 2 (two) times daily.    . Ascorbic Acid (VITAMIN C) 1000 MG tablet Take 1,000-2,000 mg by mouth daily.     Marland Kitchen aspirin-acetaminophen-caffeine (EXCEDRIN MIGRAINE) 250-250-65 MG tablet Take 1 tablet by mouth 2 (two) times daily.    Marland Kitchen atenolol (TENORMIN) 100 MG tablet Take 100 mg by mouth daily.      . Biotin 5000 MCG TABS Take 1 tablet by mouth daily.    . Cholecalciferol (VITAMIN D) 2000 units CAPS Take 1 capsule by mouth daily.    . citalopram (CELEXA) 20 MG tablet Take 20 mg by mouth every other day.     . cyclobenzaprine (FLEXERIL) 10 MG tablet Take 10 mg by mouth 2 (two) times daily as needed for muscle spasms.     . folic acid (FOLVITE) 1 MG tablet Take 1 mg by mouth daily.    Marland Kitchen glimepiride (AMARYL) 1 MG tablet Take 1 mg by mouth daily before breakfast.     . glucose blood  test strip 1 each by Other route daily. Use as instructed ....Marland KitchenAVIVA PLUS    . HYDROcodone-acetaminophen (NORCO/VICODIN) 5-325 MG tablet Take 1  tablet by mouth every 4 (four) hours as needed for moderate pain.   0  . levothyroxine (SYNTHROID, LEVOTHROID) 88 MCG tablet Take 88 mcg by mouth daily before breakfast.     . lisinopril (PRINIVIL,ZESTRIL) 5 MG tablet Take 5 mg by mouth daily.     Marland Kitchen loratadine (CLARITIN) 10 MG tablet Take 10 mg by mouth daily.    . metFORMIN (GLUCOPHAGE) 1000 MG tablet Take 1,000 mg by mouth 2 (two) times daily with a meal.     . NONFORMULARY OR COMPOUNDED ITEM Apply 1 application topically 3 (three) times daily as needed (Pain). Diclofenac, Baclofen, Gabapentin, Lidocaine, Menthol - Compounded at [facility]    . omeprazole (PRILOSEC) 20 MG capsule Take 20 mg by mouth daily.     Marland Kitchen rOPINIRole (REQUIP) 0.5 MG tablet Take 0.5-1 mg by mouth at bedtime.    . vitamin B-12 (CYANOCOBALAMIN) 1000 MCG tablet Take 1,000 mcg by mouth daily.     No current facility-administered medications for this visit.     Although listed on the patient's medicine profile she notes that since the bleeding and bruising episode along the left neck and left chest she has not taken any other aspirin.  Pertinent items are noted in HPI.   Review of Systems:     Cardiac Review of Systems: [Y] = yes  or   [ N ] = no   Chest Pain [  n  ]  Resting SOB [ n  ] Exertional SOB  [ y ]  Vertell Limber Florencio.Farrier  ]   Pedal Edema [ n  ]    Palpitations [n  ] Syncope  [ n ]   Presyncope [  n ]   General Review of Systems: [Y] = yes [  ]=no Constitional: recent weight change [  ];  Wt loss over the last 3 months [   ] anorexia [  ]; fatigue [  ]; nausea [  ]; night sweats [  ]; fever [  ]; or chills [  ];           Eye : blurred vision [  ]; diplopia [   ]; vision changes [  ];  Amaurosis fugax[  ]; Resp: cough [  ];  wheezing[  ];  hemoptysis[  ]; shortness of breath[  ]; paroxysmal nocturnal dyspnea[  ]; dyspnea on exertion[  ]; or orthopnea[  ];  GI:  gallstones[  ], vomiting[  ];  dysphagia[  ]; melena[  ];  hematochezia [  ]; heartburn[  ];   Hx of   Colonoscopy[  ]; GU: kidney stones [  ]; hematuria[  ];   dysuria [  ];  nocturia[  ];  history of     obstruction [  ]; urinary frequency [  ]             Skin: rash, swelling[  ];, hair loss[  ];  peripheral edema[  ];  or itching[  ]; Musculosketetal: myalgias[  ];  joint swelling[  ];  joint erythema[  ];  joint pain[  ];  back pain[y  ];  Heme/Lymph: bruising[ y ];  bleeding[y  ];  anemia[  ];  Neuro: TIA[  ];  headaches[  ];  stroke[  ];  vertigo[  ];  seizures[  ];   paresthesias[  ];  difficulty walking[y  ];  Psych:depression[  ]; anxiety[  ];  Endocrine: diabetes[  ];  thyroid dysfunction[  ];  Immunizations: Flu up to date Blue.Reese  ]; Pneumococcal up to date Blue.Reese  ];  Other:     PHYSICAL EXAMINATION: BP (!) 182/118 (BP Location: Left Arm, Patient Position: Sitting, Cuff Size: Normal) Comment: manual  Pulse 75   Resp 18   Ht 5' 5"  (1.651 m)   Wt 81.6 kg   SpO2 97% Comment: RA  BMI 29.95 kg/m  General appearance: alert, cooperative, appears older than stated age and no distress Head: Normocephalic, without obvious abnormality, atraumatic Neck: no adenopathy, no carotid bruit, no JVD, supple, symmetrical, trachea midline and thyroid not enlarged, symmetric, no tenderness/mass/nodules Lymph nodes: Cervical, supraclavicular, and axillary nodes normal. Resp: clear to auscultation bilaterally Back: symmetric, no curvature. ROM normal. No CVA tenderness. Cardio: regular rate and rhythm, S1, S2 normal, no murmur, click, rub or gallop GI: soft, non-tender; bowel sounds normal; no masses,  no organomegaly Extremities: extremities normal, atraumatic, no cyanosis or edema and Homans sign is negative, no sign of DVT Neurologic: Grossly normal but the patient does have a slow gait as she walks.  Diagnostic Studies & Laboratory data:     Recent Radiology Findings:   CLINICAL DATA:  Left shoulder hematoma.  No known injury.  EXAM: CT CHEST WITH CONTRAST  TECHNIQUE: Multidetector CT  imaging of the chest was performed during intravenous contrast administration.  CONTRAST:  28m OMNIPAQUE IOHEXOL 300 MG/ML  SOLN  COMPARISON:  CT chest 03/20/2014  FINDINGS: Cardiovascular: Normal heart size. Coronary arterial vascular calcifications. Thoracic aortic vascular calcifications.  Mediastinum/Nodes: No enlarged axillary, mediastinal or hilar lymphadenopathy. Normal appearance of the esophagus.  Lungs/Pleura: Central airways are patent. Dependent atelectasis/scarring within the bilateral lower lobes. 1.7 x 1.2 cm irregular sub solid nodule left lung apex (image 21; series 4), previously measuring 0.7 x 1.0 cm. No large area of pulmonary consolidation. No pleural effusion or pneumothorax.  Upper Abdomen: No acute process.  Musculoskeletal: Thoracic spine degenerative changes. No aggressive or acute appearing osseous lesions.  IMPRESSION: 1. There is a 1.7 cm enlarging sub solid nodule within the left lung apex. Possibility of slow growing malignancy should be considered. Recommend multi disciplinary thoracic oncology consultation. 2. No acute process within the chest. thank 3. Aortic Atherosclerosis (ICD10-I70.0).   Electronically Signed   By: DLovey NewcomerM.D.   On: 09/01/2018 20:33  Scan from 2015: no mention of left  upper lobe lesion that is present    I have independently reviewed the above radiology studies  and reviewed the findings with the patient.  08/2018:   02/2014:   Recent Lab Findings: Lab Results  Component Value Date   WBC 7.6 09/01/2018   HGB 11.2 (L) 09/01/2018   HCT 35.2 (L) 09/01/2018   PLT 295 09/01/2018   GLUCOSE 91 09/01/2018   CHOL 254 (H) 02/28/2017   TRIG 344 (H) 02/28/2017   HDL 49 02/28/2017   LDLCALC 136 (H) 02/28/2017   ALT 21 09/01/2018   AST 22 09/01/2018   NA 126 (L) 09/01/2018   K 4.5 09/01/2018   CL 93 (L) 09/01/2018   CREATININE 0.75 09/01/2018   BUN 18 09/01/2018   CO2 22 09/01/2018   TSH  2.808 02/27/2017   INR 0.91 09/01/2018   HGBA1C 6.1 (H) 02/27/2017      Assessment / Plan:   Patient has a persistent parenchymal irregularity left upper lobe, that was present in  2015 and is incidentally noted again in 2019 when she obtain a CT scan for spontaneous bruising along the left neck and chest.  The area in question does appear to have lower enlarged very slightly over the 4 years.  The mass is indeterminate as if it is malignant or not but if it is it is changing slowly.-I discussed with the patient proceeding with PET scan and needle biopsy now or the other option to obtain a follow-up CT scan in 3 months which will be approximately 4 months after the previous scan.  If we document continued steady enlargement will obtain a PET scan and needle biopsy.  The patient would be a poor candidate for surgical lung resection.   I discussed and reviewed the films with the patient and her son in detail and we will see her back in 3 months with a follow-up noncontrasted CT of the chest.  She will contact Dr. Myrtis Ser, spine surgery to let them know she would like to proceed with her neck surgery.     I  spent 60 minutes with  the patient face to face and greater then 50% of the time was spent in counseling and coordination of care.    Grace Isaac MD      [address] [place] [address] Office [phone]   Beeper [phone]  10/09/2018 3:30 PM

## 2018-10-09 ENCOUNTER — Institutional Professional Consult (permissible substitution) (INDEPENDENT_AMBULATORY_CARE_PROVIDER_SITE_OTHER): Payer: Medicare Other | Admitting: Cardiothoracic Surgery

## 2018-10-09 ENCOUNTER — Other Ambulatory Visit: Payer: Self-pay

## 2018-10-09 ENCOUNTER — Encounter: Payer: Self-pay | Admitting: *Deleted

## 2018-10-09 ENCOUNTER — Encounter: Payer: Self-pay | Admitting: Cardiothoracic Surgery

## 2018-10-09 VITALS — BP 182/118 | HR 75 | Resp 18 | Ht 65.0 in | Wt 180.0 lb

## 2018-10-09 DIAGNOSIS — R911 Solitary pulmonary nodule: Secondary | ICD-10-CM | POA: Diagnosis not present

## 2018-11-07 DIAGNOSIS — R112 Nausea with vomiting, unspecified: Secondary | ICD-10-CM | POA: Diagnosis not present

## 2018-11-07 DIAGNOSIS — G459 Transient cerebral ischemic attack, unspecified: Secondary | ICD-10-CM | POA: Diagnosis not present

## 2018-11-07 DIAGNOSIS — E079 Disorder of thyroid, unspecified: Secondary | ICD-10-CM | POA: Diagnosis not present

## 2018-11-07 DIAGNOSIS — K219 Gastro-esophageal reflux disease without esophagitis: Secondary | ICD-10-CM | POA: Diagnosis not present

## 2018-11-07 DIAGNOSIS — Z9889 Other specified postprocedural states: Secondary | ICD-10-CM | POA: Diagnosis not present

## 2018-11-07 DIAGNOSIS — Z01818 Encounter for other preprocedural examination: Secondary | ICD-10-CM | POA: Diagnosis not present

## 2018-11-07 DIAGNOSIS — I10 Essential (primary) hypertension: Secondary | ICD-10-CM | POA: Diagnosis not present

## 2018-11-07 DIAGNOSIS — T4145XD Adverse effect of unspecified anesthetic, subsequent encounter: Secondary | ICD-10-CM | POA: Diagnosis not present

## 2018-11-07 DIAGNOSIS — E119 Type 2 diabetes mellitus without complications: Secondary | ICD-10-CM | POA: Diagnosis not present

## 2018-11-07 DIAGNOSIS — M4712 Other spondylosis with myelopathy, cervical region: Secondary | ICD-10-CM | POA: Diagnosis not present

## 2018-11-16 DIAGNOSIS — E6609 Other obesity due to excess calories: Secondary | ICD-10-CM | POA: Diagnosis not present

## 2018-11-16 DIAGNOSIS — Z6830 Body mass index (BMI) 30.0-30.9, adult: Secondary | ICD-10-CM | POA: Diagnosis not present

## 2018-11-16 DIAGNOSIS — G8929 Other chronic pain: Secondary | ICD-10-CM | POA: Diagnosis not present

## 2018-11-16 DIAGNOSIS — N342 Other urethritis: Secondary | ICD-10-CM | POA: Diagnosis not present

## 2018-11-26 ENCOUNTER — Other Ambulatory Visit: Payer: Self-pay | Admitting: Cardiothoracic Surgery

## 2018-11-26 DIAGNOSIS — K219 Gastro-esophageal reflux disease without esophagitis: Secondary | ICD-10-CM | POA: Diagnosis not present

## 2018-11-26 DIAGNOSIS — I10 Essential (primary) hypertension: Secondary | ICD-10-CM | POA: Diagnosis not present

## 2018-11-26 DIAGNOSIS — Z7984 Long term (current) use of oral hypoglycemic drugs: Secondary | ICD-10-CM | POA: Diagnosis not present

## 2018-11-26 DIAGNOSIS — Z8673 Personal history of transient ischemic attack (TIA), and cerebral infarction without residual deficits: Secondary | ICD-10-CM | POA: Diagnosis not present

## 2018-11-26 DIAGNOSIS — E039 Hypothyroidism, unspecified: Secondary | ICD-10-CM | POA: Diagnosis not present

## 2018-11-26 DIAGNOSIS — E119 Type 2 diabetes mellitus without complications: Secondary | ICD-10-CM | POA: Diagnosis not present

## 2018-11-26 DIAGNOSIS — M4712 Other spondylosis with myelopathy, cervical region: Secondary | ICD-10-CM | POA: Diagnosis not present

## 2018-11-26 DIAGNOSIS — R918 Other nonspecific abnormal finding of lung field: Secondary | ICD-10-CM

## 2018-11-26 DIAGNOSIS — Z79899 Other long term (current) drug therapy: Secondary | ICD-10-CM | POA: Diagnosis not present

## 2018-11-27 DIAGNOSIS — I10 Essential (primary) hypertension: Secondary | ICD-10-CM | POA: Diagnosis not present

## 2018-11-27 DIAGNOSIS — Z8673 Personal history of transient ischemic attack (TIA), and cerebral infarction without residual deficits: Secondary | ICD-10-CM | POA: Diagnosis not present

## 2018-11-27 DIAGNOSIS — E119 Type 2 diabetes mellitus without complications: Secondary | ICD-10-CM | POA: Diagnosis not present

## 2018-11-27 DIAGNOSIS — K219 Gastro-esophageal reflux disease without esophagitis: Secondary | ICD-10-CM | POA: Diagnosis not present

## 2018-11-27 DIAGNOSIS — M4712 Other spondylosis with myelopathy, cervical region: Secondary | ICD-10-CM | POA: Diagnosis not present

## 2018-11-27 DIAGNOSIS — E039 Hypothyroidism, unspecified: Secondary | ICD-10-CM | POA: Diagnosis not present

## 2018-11-28 ENCOUNTER — Emergency Department (HOSPITAL_COMMUNITY): Payer: Medicare Other

## 2018-11-28 ENCOUNTER — Inpatient Hospital Stay (HOSPITAL_COMMUNITY)
Admission: EM | Admit: 2018-11-28 | Discharge: 2018-12-02 | DRG: 385 | Disposition: A | Discharge status: Home / Self Care | Payer: Medicare Other | Attending: Family Medicine | Admitting: Family Medicine

## 2018-11-28 ENCOUNTER — Encounter (HOSPITAL_COMMUNITY): Payer: Self-pay

## 2018-11-28 DIAGNOSIS — I10 Essential (primary) hypertension: Secondary | ICD-10-CM | POA: Diagnosis not present

## 2018-11-28 DIAGNOSIS — K51511 Left sided colitis with rectal bleeding: Principal | ICD-10-CM | POA: Diagnosis present

## 2018-11-28 DIAGNOSIS — Z888 Allergy status to other drugs, medicaments and biological substances status: Secondary | ICD-10-CM

## 2018-11-28 DIAGNOSIS — E860 Dehydration: Secondary | ICD-10-CM | POA: Diagnosis present

## 2018-11-28 DIAGNOSIS — R4182 Altered mental status, unspecified: Secondary | ICD-10-CM | POA: Diagnosis not present

## 2018-11-28 DIAGNOSIS — Z79899 Other long term (current) drug therapy: Secondary | ICD-10-CM

## 2018-11-28 DIAGNOSIS — Z7984 Long term (current) use of oral hypoglycemic drugs: Secondary | ICD-10-CM

## 2018-11-28 DIAGNOSIS — E785 Hyperlipidemia, unspecified: Secondary | ICD-10-CM | POA: Diagnosis present

## 2018-11-28 DIAGNOSIS — K5731 Diverticulosis of large intestine without perforation or abscess with bleeding: Secondary | ICD-10-CM | POA: Diagnosis present

## 2018-11-28 DIAGNOSIS — K5730 Diverticulosis of large intestine without perforation or abscess without bleeding: Secondary | ICD-10-CM | POA: Diagnosis not present

## 2018-11-28 DIAGNOSIS — J9811 Atelectasis: Secondary | ICD-10-CM | POA: Diagnosis not present

## 2018-11-28 DIAGNOSIS — E119 Type 2 diabetes mellitus without complications: Secondary | ICD-10-CM | POA: Diagnosis present

## 2018-11-28 DIAGNOSIS — Z885 Allergy status to narcotic agent status: Secondary | ICD-10-CM

## 2018-11-28 DIAGNOSIS — R402421 Glasgow coma scale score 9-12, in the field [EMT or ambulance]: Secondary | ICD-10-CM | POA: Diagnosis not present

## 2018-11-28 DIAGNOSIS — R52 Pain, unspecified: Secondary | ICD-10-CM | POA: Diagnosis not present

## 2018-11-28 DIAGNOSIS — Z981 Arthrodesis status: Secondary | ICD-10-CM

## 2018-11-28 DIAGNOSIS — Z79891 Long term (current) use of opiate analgesic: Secondary | ICD-10-CM

## 2018-11-28 DIAGNOSIS — Z66 Do not resuscitate: Secondary | ICD-10-CM | POA: Diagnosis present

## 2018-11-28 DIAGNOSIS — K529 Noninfective gastroenteritis and colitis, unspecified: Secondary | ICD-10-CM | POA: Diagnosis not present

## 2018-11-28 DIAGNOSIS — R404 Transient alteration of awareness: Secondary | ICD-10-CM | POA: Diagnosis not present

## 2018-11-28 DIAGNOSIS — Z79890 Hormone replacement therapy (postmenopausal): Secondary | ICD-10-CM

## 2018-11-28 DIAGNOSIS — K760 Fatty (change of) liver, not elsewhere classified: Secondary | ICD-10-CM | POA: Diagnosis not present

## 2018-11-28 DIAGNOSIS — E871 Hypo-osmolality and hyponatremia: Secondary | ICD-10-CM | POA: Diagnosis not present

## 2018-11-28 DIAGNOSIS — Z881 Allergy status to other antibiotic agents status: Secondary | ICD-10-CM

## 2018-11-28 DIAGNOSIS — K921 Melena: Secondary | ICD-10-CM

## 2018-11-28 DIAGNOSIS — E872 Acidosis: Secondary | ICD-10-CM | POA: Diagnosis not present

## 2018-11-28 DIAGNOSIS — D649 Anemia, unspecified: Secondary | ICD-10-CM | POA: Diagnosis present

## 2018-11-28 DIAGNOSIS — A09 Infectious gastroenteritis and colitis, unspecified: Secondary | ICD-10-CM | POA: Diagnosis not present

## 2018-11-28 DIAGNOSIS — R197 Diarrhea, unspecified: Secondary | ICD-10-CM | POA: Diagnosis not present

## 2018-11-28 DIAGNOSIS — Z87891 Personal history of nicotine dependence: Secondary | ICD-10-CM

## 2018-11-28 DIAGNOSIS — E782 Mixed hyperlipidemia: Secondary | ICD-10-CM | POA: Diagnosis present

## 2018-11-28 DIAGNOSIS — R0902 Hypoxemia: Secondary | ICD-10-CM | POA: Diagnosis not present

## 2018-11-28 DIAGNOSIS — Z8673 Personal history of transient ischemic attack (TIA), and cerebral infarction without residual deficits: Secondary | ICD-10-CM

## 2018-11-28 DIAGNOSIS — I959 Hypotension, unspecified: Secondary | ICD-10-CM | POA: Diagnosis present

## 2018-11-28 DIAGNOSIS — E039 Hypothyroidism, unspecified: Secondary | ICD-10-CM | POA: Diagnosis present

## 2018-11-28 DIAGNOSIS — M542 Cervicalgia: Secondary | ICD-10-CM | POA: Diagnosis not present

## 2018-11-28 LAB — LIPASE, BLOOD: Lipase: 68 U/L — ABNORMAL HIGH (ref 11–51)

## 2018-11-28 LAB — URINALYSIS, ROUTINE W REFLEX MICROSCOPIC
Bilirubin Urine: NEGATIVE
GLUCOSE, UA: NEGATIVE mg/dL
HGB URINE DIPSTICK: NEGATIVE
Ketones, ur: NEGATIVE mg/dL
Leukocytes, UA: NEGATIVE
Nitrite: NEGATIVE
Protein, ur: NEGATIVE mg/dL
Specific Gravity, Urine: 1.011 (ref 1.005–1.030)
pH: 6 (ref 5.0–8.0)

## 2018-11-28 LAB — CBC WITH DIFFERENTIAL/PLATELET
Abs Immature Granulocytes: 0.13 10*3/uL — ABNORMAL HIGH (ref 0.00–0.07)
BASOS PCT: 1 %
Basophils Absolute: 0.2 10*3/uL — ABNORMAL HIGH (ref 0.0–0.1)
Eosinophils Absolute: 0.2 10*3/uL (ref 0.0–0.5)
Eosinophils Relative: 1 %
HCT: 51.8 % — ABNORMAL HIGH (ref 36.0–46.0)
Hemoglobin: 16 g/dL — ABNORMAL HIGH (ref 12.0–15.0)
Immature Granulocytes: 1 %
Lymphocytes Relative: 7 %
Lymphs Abs: 1.5 10*3/uL (ref 0.7–4.0)
MCH: 26.5 pg (ref 26.0–34.0)
MCHC: 30.9 g/dL (ref 30.0–36.0)
MCV: 85.9 fL (ref 80.0–100.0)
Monocytes Absolute: 0.4 10*3/uL (ref 0.1–1.0)
Monocytes Relative: 2 %
Neutro Abs: 17.5 10*3/uL — ABNORMAL HIGH (ref 1.7–7.7)
Neutrophils Relative %: 88 %
PLATELETS: 300 10*3/uL (ref 150–400)
RBC: 6.03 MIL/uL — ABNORMAL HIGH (ref 3.87–5.11)
RDW: 15.4 % (ref 11.5–15.5)
WBC: 19.9 10*3/uL — ABNORMAL HIGH (ref 4.0–10.5)
nRBC: 0 % (ref 0.0–0.2)

## 2018-11-28 LAB — RAPID URINE DRUG SCREEN, HOSP PERFORMED
Amphetamines: NOT DETECTED
BARBITURATES: NOT DETECTED
Benzodiazepines: POSITIVE — AB
Cocaine: NOT DETECTED
Opiates: POSITIVE — AB
Tetrahydrocannabinol: NOT DETECTED

## 2018-11-28 LAB — COMPREHENSIVE METABOLIC PANEL
ALT: 16 U/L (ref 0–44)
AST: 32 U/L (ref 15–41)
Albumin: 3.4 g/dL — ABNORMAL LOW (ref 3.5–5.0)
Alkaline Phosphatase: 357 U/L — ABNORMAL HIGH (ref 38–126)
Anion gap: 13 (ref 5–15)
BUN: 15 mg/dL (ref 8–23)
CO2: 14 mmol/L — ABNORMAL LOW (ref 22–32)
Calcium: 8.2 mg/dL — ABNORMAL LOW (ref 8.9–10.3)
Chloride: 94 mmol/L — ABNORMAL LOW (ref 98–111)
Creatinine, Ser: 0.97 mg/dL (ref 0.44–1.00)
GFR calc Af Amer: >60 mL/min (ref >60)
GFR calc non Af Amer: 59 mL/min — ABNORMAL LOW (ref >60)
Glucose, Bld: 134 mg/dL — ABNORMAL HIGH (ref 70–99)
Potassium: 4.4 mmol/L (ref 3.5–5.1)
Sodium: 121 mmol/L — ABNORMAL LOW (ref 135–145)
Total Bilirubin: 0.7 mg/dL (ref 0.3–1.2)
Total Protein: 6.8 g/dL (ref 6.5–8.1)

## 2018-11-28 LAB — ETHANOL: Alcohol, Ethyl (B): <10 mg/dL (ref <10)

## 2018-11-28 LAB — C DIFFICILE QUICK SCREEN W PCR REFLEX
C Diff antigen: NEGATIVE
C Diff interpretation: NOT DETECTED
C Diff toxin: NEGATIVE

## 2018-11-28 LAB — I-STAT TROPONIN, ED: Troponin i, poc: 0.01 ng/mL (ref 0.00–0.08)

## 2018-11-28 LAB — I-STAT CG4 LACTIC ACID, ED: Lactic Acid, Venous: 3.36 mmol/L (ref 0.5–1.9)

## 2018-11-28 LAB — CBG MONITORING, ED: Glucose-Capillary: 189 mg/dL — ABNORMAL HIGH (ref 70–99)

## 2018-11-28 MED ORDER — HYDROCODONE-ACETAMINOPHEN 5-325 MG PO TABS
1.00 | ORAL_TABLET | ORAL | Status: DC
Start: ? — End: 2018-11-28

## 2018-11-28 MED ORDER — LISINOPRIL 5 MG PO TABS
5.00 | ORAL_TABLET | ORAL | Status: DC
Start: 2018-11-28 — End: 2018-11-28

## 2018-11-28 MED ORDER — TIZANIDINE HCL 4 MG PO TABS
2.00 | ORAL_TABLET | ORAL | Status: DC
Start: ? — End: 2018-11-28

## 2018-11-28 MED ORDER — SODIUM CHLORIDE 0.9 % IV SOLN
Freq: Once | INTRAVENOUS | Status: AC
  Administered 2018-11-28: 22:00:00 via INTRAVENOUS

## 2018-11-28 MED ORDER — CHOLECALCIFEROL 25 MCG (1000 UT) PO TABS
5000.00 | ORAL_TABLET | ORAL | Status: DC
Start: 2018-11-28 — End: 2018-11-28

## 2018-11-28 MED ORDER — GENERIC EXTERNAL MEDICATION
1000.00 | Status: DC
Start: 2018-11-28 — End: 2018-11-28

## 2018-11-28 MED ORDER — SIMETHICONE 80 MG PO CHEW
80.00 | CHEWABLE_TABLET | ORAL | Status: DC
Start: ? — End: 2018-11-28

## 2018-11-28 MED ORDER — ACETAMINOPHEN 500 MG PO TABS
1000.0000 mg | ORAL_TABLET | Freq: Once | ORAL | Status: AC
  Administered 2018-11-29: 1000 mg via ORAL
  Filled 2018-11-28: qty 2

## 2018-11-28 MED ORDER — VITAMIN B-12 1000 MCG PO TABS
5000.00 | ORAL_TABLET | ORAL | Status: DC
Start: 2018-11-28 — End: 2018-11-28

## 2018-11-28 MED ORDER — SENNOSIDES-DOCUSATE SODIUM 8.6-50 MG PO TABS
1.00 | ORAL_TABLET | ORAL | Status: DC
Start: 2018-11-27 — End: 2018-11-28

## 2018-11-28 MED ORDER — DIPHENHYDRAMINE HCL 50 MG/ML IJ SOLN
25.00 | INTRAMUSCULAR | Status: DC
Start: ? — End: 2018-11-28

## 2018-11-28 MED ORDER — SODIUM CHLORIDE 0.9 % IV SOLN
2.0000 g | Freq: Once | INTRAVENOUS | Status: AC
  Administered 2018-11-28: 2 g via INTRAVENOUS
  Filled 2018-11-28: qty 2

## 2018-11-28 MED ORDER — VANCOMYCIN HCL 10 G IV SOLR
1500.0000 mg | Freq: Once | INTRAVENOUS | Status: AC
  Administered 2018-11-28: 1500 mg via INTRAVENOUS
  Filled 2018-11-28 (×2): qty 1500

## 2018-11-28 MED ORDER — ATENOLOL 50 MG PO TABS
100.00 | ORAL_TABLET | ORAL | Status: DC
Start: 2018-11-28 — End: 2018-11-28

## 2018-11-28 MED ORDER — SODIUM CHLORIDE 0.9 % IV BOLUS
1000.0000 mL | Freq: Once | INTRAVENOUS | Status: AC
  Administered 2018-11-28: 1000 mL via INTRAVENOUS

## 2018-11-28 MED ORDER — SODIUM CHLORIDE 0.9 % IV SOLN: 2.0000 g | Freq: Two times a day (BID) | INTRAVENOUS | Status: DC

## 2018-11-28 MED ORDER — FOLIC ACID 400 MCG PO TABS
800.00 | ORAL_TABLET | ORAL | Status: DC
Start: 2018-11-28 — End: 2018-11-28

## 2018-11-28 MED ORDER — METFORMIN HCL 500 MG PO TABS
1000.00 | ORAL_TABLET | ORAL | Status: DC
Start: 2018-11-27 — End: 2018-11-28

## 2018-11-28 MED ORDER — GLIMEPIRIDE 1 MG PO TABS
1.00 | ORAL_TABLET | ORAL | Status: DC
Start: 2018-11-28 — End: 2018-11-28

## 2018-11-28 MED ORDER — ROPINIROLE HCL 0.25 MG PO TABS
0.50 | ORAL_TABLET | ORAL | Status: DC
Start: ? — End: 2018-11-28

## 2018-11-28 MED ORDER — VANCOMYCIN HCL IN DEXTROSE 750-5 MG/150ML-% IV SOLN
750.0000 mg | Freq: Two times a day (BID) | INTRAVENOUS | Status: DC
  Filled 2018-11-28: qty 150

## 2018-11-28 MED ORDER — NALOXONE HCL 0.4 MG/ML IJ SOLN
INTRAMUSCULAR | Status: AC
  Administered 2018-11-28: 18:00:00
  Filled 2018-11-28: qty 1

## 2018-11-28 MED ORDER — ALUMINUM-MAGNESIUM-SIMETHICONE 200-200-20 MG/5ML PO SUSP
30.00 | ORAL | Status: DC
Start: ? — End: 2018-11-28

## 2018-11-28 MED ORDER — GENERIC EXTERNAL MEDICATION
4.00 | Status: DC
Start: ? — End: 2018-11-28

## 2018-11-28 MED ORDER — VANCOMYCIN HCL IN DEXTROSE 1-5 GM/200ML-% IV SOLN
1000.0000 mg | Freq: Once | INTRAVENOUS | Status: DC
  Filled 2018-11-28: qty 200

## 2018-11-28 MED ORDER — CYCLOBENZAPRINE HCL 10 MG PO TABS
10.00 | ORAL_TABLET | ORAL | Status: DC
Start: ? — End: 2018-11-28

## 2018-11-28 MED ORDER — CITALOPRAM HYDROBROMIDE 20 MG PO TABS
20.00 | ORAL_TABLET | ORAL | Status: DC
Start: 2018-11-28 — End: 2018-11-28

## 2018-11-28 MED ORDER — OXYCODONE HCL 5 MG PO TABS
5.00 | ORAL_TABLET | ORAL | Status: DC
Start: ? — End: 2018-11-28

## 2018-11-28 MED ORDER — LEVOTHYROXINE SODIUM 88 MCG PO TABS
88.00 | ORAL_TABLET | ORAL | Status: DC
Start: 2018-11-28 — End: 2018-11-28

## 2018-11-28 MED ORDER — POLYETHYLENE GLYCOL 3350 17 G PO PACK
17.00 | PACK | ORAL | Status: DC
Start: 2018-11-28 — End: 2018-11-28

## 2018-11-28 MED ORDER — DIPHENHYDRAMINE HCL 25 MG PO CAPS
25.00 | ORAL_CAPSULE | ORAL | Status: DC
Start: ? — End: 2018-11-28

## 2018-11-28 MED ORDER — IOPAMIDOL (ISOVUE-300) INJECTION 61%
100.0000 mL | Freq: Once | INTRAVENOUS | Status: AC | PRN
  Administered 2018-11-28: 100 mL via INTRAVENOUS

## 2018-11-28 MED ORDER — METRONIDAZOLE IN NACL 5-0.79 MG/ML-% IV SOLN
500.0000 mg | Freq: Three times a day (TID) | INTRAVENOUS | Status: DC
  Administered 2018-11-28 – 2018-12-01 (×8): 500 mg via INTRAVENOUS
  Filled 2018-11-28 (×9): qty 100

## 2018-11-28 MED ORDER — BISACODYL 10 MG RE SUPP
10.00 | RECTAL | Status: DC
Start: ? — End: 2018-11-28

## 2018-11-28 MED ORDER — RANITIDINE HCL 150 MG PO TABS
150.00 | ORAL_TABLET | ORAL | Status: DC
Start: ? — End: 2018-11-28

## 2018-11-28 MED ORDER — PANTOPRAZOLE SODIUM 40 MG PO TBEC
40.00 | DELAYED_RELEASE_TABLET | ORAL | Status: DC
Start: 2018-11-28 — End: 2018-11-28

## 2018-11-28 MED ORDER — FEXOFENADINE HCL 60 MG PO TABS
60.00 | ORAL_TABLET | ORAL | Status: DC
Start: 2018-11-28 — End: 2018-11-28

## 2018-11-28 NOTE — ED Triage Notes (Signed)
EMS reports pt had spinal fusion at [facility] on Monday.  Reports pt took a hydrocodone this afternoon.  EMS says pt started c/o abd pain and became weak and almost fell while trying to use the bathroom.  EMS arrived and pt was laying in the bedroom and was cool to touch.  Pt opened eyes when moved to er stretcher.  BP 80-60 and ems started IVF bolus.  bp increased 124 and reports pt more responsive with ems.  Pt very lethargic, opens eyes to voice.  Oriented to name but no place.  Pt grimaces when tries to move neck.  CBG 169.

## 2018-11-28 NOTE — Progress Notes (Signed)
Pharmacy Antibiotic Note  Brittany Goodman is a 73 y.o. female admitted on 11/28/2018 with sepsis.  Pharmacy has been consulted for cefepime and vancomycin dosing.  Plan: vancomycin 1.5gm iv x 1  Vancomycin 767m IV every 12 hours.  Goal trough 15-20 mcg/mL. cefepime 2gm iv q12h  Height: 5' 5"  (165.1 cm) Weight: 180 lb (81.6 kg) IBW/kg (Calculated) : 57  No data recorded.  Recent Labs  Lab 11/28/18 1756 11/28/18 2002  WBC  --  19.9*  CREATININE  --  0.97  LATICACIDVEN 3.36*  --     Estimated Creatinine Clearance: 56.1 mL/min (by C-G formula based on SCr of 0.97 mg/dL).    Allergies  Allergen Reactions  . Codeine Other (See Comments)    "makes me feel bad"  . Dilaudid [Hydromorphone Hcl] Other (See Comments)    hallucination  . Morphine     vomiting  . Tetracycline     vomiting    Antimicrobials this admission: 1/8 cefepime >>  1/8 vancomycin >>  1/8 metronidazole >>  Microbiology results: 1/8 BCx: sent 1/8 UCx: sent  1/8 c diff quick scan: negative   Thank you for allowing pharmacy to be a part of this patient's care.  Brittany Goodman 11/28/2018 9:24 PM

## 2018-11-28 NOTE — ED Provider Notes (Signed)
Emergency Department Provider Note   I have reviewed the triage vital signs and the nursing notes.   HISTORY  Chief Complaint Altered Mental Status   HPI Brittany Goodman is a 73 y.o. female with PMH of DM, HLD, HTN, Hypothyroidism, and prior CVA currently POD #2 s/p ACDF C3-4 with Dr. Owens Shark at [facility] presents to the emergency department with acute onset altered mental status.  Patient was last seen normal at 11:30 AM.  She has been recovering at home with family.  They state that she has been taking her pain medication sparingly and overall improving.  She ate breakfast this morning.  Around 11:30 AM she told her son she needed to go to the bathroom.  She went downstairs to her bathroom with some assistance and her son waited for her.  He states that he heard a thud against the wall and went in to see the patient with weakness and leaning against the wall.  She had not fallen.  She had been complaining of abdominal cramping.  Family states that sometimes she will have abdominal cramping with diarrhea followed by severe weakness.  They were monitoring her at home when symptoms got acutely worse.  They state that the patient became "incoherent" around 3:30 PM. No fever. Patient had not been complaining of CP or SOB prior to getting worse. No additional vomiting or diarrhea. Patient is somnolent. She can follow commands but cannot give a full history. Speaking in a very soft voice.   Level 5 caveat: Patient is somnolent.    Past Medical History:  Diagnosis Date  . Arthritis   . Depression   . Diabetes mellitus type II   . Diverticulosis   . GERD (gastroesophageal reflux disease)   . Hyperlipidemia   . Hypertension   . Hypothyroidism   . Sepsis ([place])    2011, Escherichia coli pyelonephritis  . Sleep apnea    Does not use CPAP. Cannot tolerate  . Spinal stenosis   . Stroke [facility])    no deficits    Patient Active Problem List   Diagnosis Date Noted  . 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 Abdominal pain, left lower quadrant 05/22/2018  . Rectal bleeding 05/22/2018  . Weakness due to cerebrovascular accident (CVA) 02/28/2017  . Cerebrovascular disease 02/27/2017  . RUQ pain 05/06/2013  . Abdominal pain, epigastric 05/06/2013  . Esophageal dysphagia 05/06/2013  . GERD (gastroesophageal reflux disease) 05/06/2013  . Family history of polyps in the colon 05/06/2013  . DYSPNEA 09/01/2009  . MIXED HYPERLIPIDEMIA 08/05/2009  . HYPERTENSION, BENIGN ESSENTIAL 08/05/2009  . PRECORDIAL PAIN 08/05/2009    Past Surgical History:  Procedure Laterality Date  . BACK SURGERY    . CARPAL TUNNEL RELEASE Right   . COLONOSCOPY N/A 05/29/2013   Focal left colonic inflammation, likely remnant of recent bout of ischemic or segmental colitis, s/p biopsy. pancolonic diverticulosis. Due for surveillance in 5 years  . COLONOSCOPY WITH PROPOFOL N/A 07/26/2018   Procedure: COLONOSCOPY WITH PROPOFOL;  Surgeon: Daneil Dolin, MD;  Location: [facility];  Service: Endoscopy;  Laterality: N/A;  8:30am  . ESOPHAGOGASTRODUODENOSCOPY (EGD) WITH ESOPHAGEAL DILATION N/A 05/29/2013   normal appearing patent tubular esophagus, small hiatal hernia, multiple antral erosions. no ulcer. normlal duodenum. empiric dilation  . ILIOTIBIAL BAND RELEASE Left   . POLYPECTOMY  07/26/2018   Procedure: POLYPECTOMY;  Surgeon: Daneil Dolin, MD;  Location: [facility] ENDO SUITE;  Service: Endoscopy;;  hepatic flexure polyp  .  TOTAL ABDOMINAL HYSTERECTOMY      Allergies Celecoxib; Codeine; Dilaudid [hydromorphone hcl]; Morphine; Statins; and Tetracycline  Family History  Problem Relation Age of Onset  . Heart disease Mother        MI  . Heart disease Father   . Heart attack Father 84  . Heart disease Brother   . Cancer Brother        ADRENAL GLAND  . Colon polyps Brother   . Liver cancer Brother 84       deceased , ?metastatic cancer?   . Heart disease Maternal Grandfather        MI  . Cancer  Paternal Grandfather        LUNG  . Colon cancer Neg Hx     Social History Social History   Tobacco Use  . Smoking status: Former Smoker    Packs/day: 2.00    Years: 13.00    Pack years: 26.00    Last attempt to quit: 07/19/1966    Years since quitting: 52.3  . Smokeless tobacco: Former Systems developer    Types: Chew  Substance Use Topics  . Alcohol use: No  . Drug use: No    Review of Systems  Level 5 caveat: AMS   ____________________________________________   PHYSICAL EXAM:  VITAL SIGNS: Vitals:   11/28/18 1930 11/28/18 2000  BP: 95/67 (!) 130/94  Pulse:    Resp: 15 16  SpO2:       Constitutional: Somnolent and minimally responsive. Following some commands.  Eyes: Conjunctivae are normal. Pupils are pinpoint.  Head: Atraumatic. Nose: No congestion/rhinnorhea. Mouth/Throat: Mucous membranes are dry.  Neck: No stridor.   Cardiovascular: Normal rate, regular rhythm. Good peripheral circulation. Grossly normal heart sounds.   Respiratory: Normal respiratory effort.  No retractions. Lungs CTAB. Gastrointestinal: Soft with diffuse tenderness. No rebound or guarding. No distention.  Musculoskeletal: No lower extremity tenderness nor edema. No gross deformities of extremities. Neurologic:  No gross focal neurologic deficits are appreciated. Patient's speech is soft but clear. No CN deficits but mental status limits exam.  Skin:  Skin is warm, dry and intact. No rash noted.   ____________________________________________   LABS (all labs ordered are listed, but only abnormal results are displayed)  Labs Reviewed  COMPREHENSIVE METABOLIC PANEL - Abnormal; Notable for the following components:      Result Value   Sodium 121 (*)    Chloride 94 (*)    CO2 14 (*)    Glucose, Bld 134 (*)    Calcium 8.2 (*)    Albumin 3.4 (*)    Alkaline Phosphatase 357 (*)    GFR calc non Af Amer 59 (*)    All other components within normal limits  LIPASE, BLOOD - Abnormal; Notable for  the following components:   Lipase 68 (*)    All other components within normal limits  CBC WITH DIFFERENTIAL/PLATELET - Abnormal; Notable for the following components:   WBC 19.9 (*)    RBC 6.03 (*)    Hemoglobin 16.0 (*)    HCT 51.8 (*)    Neutro Abs 17.5 (*)    Basophils Absolute 0.2 (*)    Abs Immature Granulocytes 0.13 (*)    All other components within normal limits  RAPID URINE DRUG SCREEN, HOSP PERFORMED - Abnormal; Notable for the following components:   Opiates POSITIVE (*)    Benzodiazepines POSITIVE (*)    All other components within normal limits  I-STAT CG4 LACTIC ACID, ED - Abnormal; Notable for  the following components:   Lactic Acid, Venous 3.36 (*)    All other components within normal limits  CBG MONITORING, ED - Abnormal; Notable for the following components:   Glucose-Capillary 189 (*)    All other components within normal limits  C DIFFICILE QUICK SCREEN W PCR REFLEX  CULTURE, BLOOD (ROUTINE X 2)  CULTURE, BLOOD (ROUTINE X 2)  URINE CULTURE  GASTROINTESTINAL PANEL BY PCR, STOOL (REPLACES STOOL CULTURE)  URINALYSIS, ROUTINE W REFLEX MICROSCOPIC  ETHANOL  I-STAT TROPONIN, ED  I-STAT CG4 LACTIC ACID, ED  I-STAT CG4 LACTIC ACID, ED  I-STAT CG4 LACTIC ACID, ED   ____________________________________________  EKG   EKG Interpretation  Date/Time:  Wednesday November 28 2018 17:47:15 EST Ventricular Rate:  57 PR Interval:    QRS Duration: 96 QT Interval:  481 QTC Calculation: 469 R Axis:   4 Text Interpretation:  Sinus rhythm Multiform ventricular premature complexes Low voltage, precordial leads No STEMI.  Confirmed by Nanda Quinton [phone]) on 11/28/2018 7:46:46 PM       ____________________________________________  RADIOLOGY  Ct Head Wo Contrast  Result Date: 11/28/2018 CLINICAL DATA:  Altered mental status EXAM: CT HEAD WITHOUT CONTRAST TECHNIQUE: Contiguous axial images were obtained from the base of the skull through the vertex without  intravenous contrast. COMPARISON:  Head CT 03/01/2017 FINDINGS: Brain: No acute territorial infarction, hemorrhage, or intracranial mass is visualized. The ventricles are nonenlarged. Vascular: No hyperdense vessels.  Carotid vascular calcification Skull: Normal. Negative for fracture or focal lesion. Sinuses/Orbits: Mucosal thickening in the sphenoid sinus on the right side. Small debris in the posterior left nasal passage. Other: None IMPRESSION: Negative.  No CT evidence for acute intracranial abnormality. Electronically Signed   By: Donavan Foil M.D.   On: 11/28/2018 18:15   Ct Cervical Spine Wo Contrast  Result Date: 11/28/2018 CLINICAL DATA:  Initial evaluation for recent cervical spine fusion, pain. EXAM: CT CERVICAL SPINE WITHOUT CONTRAST TECHNIQUE: Multidetector CT imaging of the cervical spine was performed without intravenous contrast. Multiplanar CT image reconstructions were also generated. COMPARISON:  Prior MRI from 09/13/2018. FINDINGS: Alignment: Straightening with slight reversal of the normal cervical lordosis, apex at C4-5. Trace anterolisthesis of C3 on C4, and C5 on C6, most likely chronic and degenerative. Skull base and vertebrae: Skull base intact. Mild rotation of C1 on C2 likely positional. Normal C1-2 articulations otherwise preserved. Dens intact. Vertebral body heights maintained. No acute fracture. Soft tissues and spinal canal: Prevertebral edema with layering effusion and soft tissue emphysema, most likely postoperative in nature related to recent C3-4 ACDF. No frank loculated collections identified. Additional soft tissue stranding seen along the left-sided neck approach. No complication or adverse features. Spinal canal within normal limits. Vascular calcifications noted about the carotid bifurcations. Disc levels: Recent ACDF at C3-4. Hardware appears intact and well position without complication small amount of postoperative gas present within the C3-4 interspace. Additional  moderate cervical spondylolysis at C5-6 and C6-7. Multilevel facet arthrosis, most notable at C3-4 on the left and C5-6 on the right. Upper chest: Loculated collection partially visualized about the right shoulder (series 4, image 86), indeterminate, not well assessed on this examination. Visualized upper chest demonstrates no other acute finding. Mild atelectatic changes present at the left lung apex. Visualized lungs are otherwise clear. Other: None. IMPRESSION: 1. No acute abnormality within the cervical spine. 2. Postoperative changes from recent ACDF at C9-4 without complication. Prevertebral swelling and effusion with soft tissue emphysema felt to be consistent with normal expected postoperative changes.  3. Additional moderate degenerative spondylolysis at C5-6 and C6-7, with multilevel facet arthrosis as above. 4. Partially visualized loculated hypodense collection about the right shoulder as above, indeterminate. Further evaluation with dedicated plain film radiography and/or cross-sectional imaging suggested as clinically warranted. Electronically Signed   By: Jeannine Boga M.D.   On: 11/28/2018 23:09   Ct Abdomen Pelvis W Contrast  Result Date: 11/28/2018 CLINICAL DATA:  Acute abdominal pain. Patient reports spinal fusion at [facility] two days ago. EXAM: CT ABDOMEN AND PELVIS WITH CONTRAST TECHNIQUE: Multidetector CT imaging of the abdomen and pelvis was performed using the standard protocol following bolus administration of intravenous contrast. CONTRAST:  177m ISOVUE-300 IOPAMIDOL (ISOVUE-300) INJECTION 61% COMPARISON:  Abdominal CTA 06/08/2018 FINDINGS: Lower chest: Mild hypoventilatory atelectasis. No pleural fluid or confluent airspace disease. There are coronary artery calcifications. Hepatobiliary: Mild hepatic steatosis without focal hepatic abnormality. Gallbladder distention without calcified gallstone, pericholecystic inflammation, or biliary dilatation. Pancreas: No ductal dilatation or  inflammation. Spleen: Normal in size without focal abnormality. Adrenals/Urinary Tract: Normal adrenal glands. No hydronephrosis or perinephric edema. Homogeneous renal enhancement with symmetric excretion on delayed phase imaging. Urinary bladder is distended without wall thickening. Stomach/Bowel: Colonic wall thickening with pericolonic edema extending from the mid transverse colon through the rectum. Liquid stool throughout the entire colon. No pneumatosis or perforation. Colonic diverticulosis without focal Peri diverticular inflammation. Appendix is not confidently visualized. No small bowel dilatation, obstruction, or inflammatory change. Stomach is unremarkable. Vascular/Lymphatic: Aortic and branch atherosclerosis. Calcifications at the origin of the celiac and SMA arteries. No evidence of acute vascular abnormality. Portal vein is patent. Reproductive: Status post hysterectomy. No adnexal masses. Other: Small volume of free fluid in the pelvis, pericolic gutters, and upper abdomen. No free air. No loculated fluid or intra-abdominal abscess. Tiny fat containing umbilical hernia. Musculoskeletal: Scoliosis and multilevel degenerative change throughout spine. There are no acute or suspicious osseous abnormalities. IMPRESSION: 1. Colitis extending from the mid transverse colon through the rectum, likely infectious or inflammatory. Consider C diff. The distribution is not consistent with ischemic colitis. 2. Colonic diverticulosis without evidence of focal diverticulitis. 3. Mild hepatic steatosis. 4.  Aortic Atherosclerosis (ICD10-I70.0). Electronically Signed   By: MKeith RakeM.D.   On: 11/28/2018 22:29   Dg Chest Portable 1 View  Result Date: 11/28/2018 CLINICAL DATA:  Altered mental status EXAM: PORTABLE CHEST 1 VIEW COMPARISON:  02/28/2017 FINDINGS: Hypoventilation with decreased lung volume and mild bibasilar atelectasis. Negative for heart failure or effusion. Negative for pneumonia. IMPRESSION:  Hypoventilation with bibasilar atelectasis. Electronically Signed   By: CFranchot GalloM.D.   On: 11/28/2018 18:29    ____________________________________________   PROCEDURES  Procedure(s) performed:   Procedures  CRITICAL CARE Performed by: JMargette FastTotal critical care time: 75 minutes Critical care time was exclusive of separately billable procedures and treating other patients. Critical care was necessary to treat or prevent imminent or life-threatening deterioration. Critical care was time spent personally by me on the following activities: development of treatment plan with patient and/or surrogate as well as nursing, discussions with consultants, evaluation of patient's response to treatment, examination of patient, obtaining history from patient or surrogate, ordering and performing treatments and interventions, ordering and review of laboratory studies, ordering and review of radiographic studies, pulse oximetry and re-evaluation of patient's condition.  JNanda Quinton MD Emergency Medicine  ____________________________________________   INITIAL IMPRESSION / ASSESSMENT AND PLAN / ED COURSE  Pertinent labs & imaging results that were available during my care of the patient  were reviewed by me and considered in my medical decision making (see chart for details).  Patient postop day 2 status post cervical fusion of C3/C4 at [facility] with Dr. Owens Shark presents with acute onset altered mental status.  Patient's initial presentation is 1 of more global encephalopathy and somnolence. Patient found to be hypotensive. Afebrile. Doubt infectious source.  Patient's responsiveness improved with Narcan.  She does remain somewhat hypotensive.  She is able to tell me that her main complaint is her abdomen which is feeling diffusely crampy and has mild tenderness on exam. Equal strength in the upper and lower extremities. No concern for CVA clinically.   Patient is responsive to IV fluids.  She  continues to have profuse, watery diarrhea in the emergency department.  C. difficile sent and is negative.  I have sent for additional stool studies.  Plan to continue IV fluids.  Patient's initial lactate is approaching 4 with elevated white blood cell count and hyponatremia.  Patient was given broad-spectrum antibiotics with concern for sepsis in the setting of hypotension.  Unclear if this is hypotension related to volume loss and severe diarrhea versus acute infection.  CT imaging of the head and cervical spine are without acute findings.  The patient cervical spine appears to have normal, postoperative changes.  The wound is well-appearing and does not appear infected.  CT imaging of the abdomen and pelvis shows diffuse colitis which correlates well with the patient's symptoms.   Discussed patient's case with Hospitalist, Dr. Darrick Meigs to request admission. Patient and family (if present) updated with plan. Care transferred to Hospitalist service.  I reviewed all nursing notes, vitals, pertinent old records, EKGs, labs, imaging (as available).  ____________________________________________  FINAL CLINICAL IMPRESSION(S) / ED DIAGNOSES  Final diagnoses:  Hypotension, unspecified hypotension type  Altered mental status, unspecified altered mental status type  Colitis  Diarrhea of presumed infectious origin  Hyponatremia     MEDICATIONS GIVEN DURING THIS VISIT:  Medications  metroNIDAZOLE (FLAGYL) IVPB 500 mg (500 mg Intravenous New Bag/Given 11/28/18 1913)  ceFEPIme (MAXIPIME) 2 g in sodium chloride 0.9 % 100 mL IVPB (has no administration in time range)  vancomycin (VANCOCIN) IVPB 750 mg/150 ml premix (has no administration in time range)  acetaminophen (TYLENOL) tablet 1,000 mg (has no administration in time range)  naloxone (NARCAN) 0.4 MG/ML injection (  Given 11/28/18 1745)  sodium chloride 0.9 % bolus 1,000 mL (1,000 mLs Intravenous New Bag/Given 11/28/18 1806)  ceFEPIme (MAXIPIME) 2 g in  sodium chloride 0.9 % 100 mL IVPB (0 g Intravenous Stopped 11/28/18 2104)  sodium chloride 0.9 % bolus 1,000 mL (1,000 mLs Intravenous New Bag/Given 11/28/18 1839)  vancomycin (VANCOCIN) 1,500 mg in sodium chloride 0.9 % 500 mL IVPB (1,500 mg Intravenous New Bag/Given 11/28/18 2026)  iopamidol (ISOVUE-300) 61 % injection 100 mL (100 mLs Intravenous Contrast Given 11/28/18 2141)  0.9 %  sodium chloride infusion ( Intravenous New Bag/Given 11/28/18 2217)    Note:  This document was prepared using Dragon voice recognition software and may include unintentional dictation errors.  Nanda Quinton, MD Emergency Medicine    , [facility], MD 11/28/18 [phone]

## 2018-11-28 NOTE — ED Notes (Signed)
Patient transported to CT 

## 2018-11-28 NOTE — ED Notes (Signed)
Pt more alert after narcan administration.  Pt verbal, eyes open.  Pt follows commands.  Pt moving all extremities and able to raise hips off bed to get on bedpan.  Pt trying to have a bm.  Family at bedside.  Placed pt on o2 at 2liters for o2 sat 70's but poor pleth on monitor.

## 2018-11-28 NOTE — ED Notes (Signed)
Pt had large loose bm.  Pt cleaned and repositioned.  PT requesting bedpan again to have another bm.

## 2018-11-29 ENCOUNTER — Other Ambulatory Visit: Payer: Self-pay

## 2018-11-29 ENCOUNTER — Encounter (HOSPITAL_COMMUNITY): Payer: Self-pay

## 2018-11-29 DIAGNOSIS — Z885 Allergy status to narcotic agent status: Secondary | ICD-10-CM | POA: Diagnosis not present

## 2018-11-29 DIAGNOSIS — I959 Hypotension, unspecified: Secondary | ICD-10-CM | POA: Diagnosis not present

## 2018-11-29 DIAGNOSIS — K529 Noninfective gastroenteritis and colitis, unspecified: Secondary | ICD-10-CM | POA: Diagnosis present

## 2018-11-29 DIAGNOSIS — D649 Anemia, unspecified: Secondary | ICD-10-CM | POA: Diagnosis present

## 2018-11-29 DIAGNOSIS — Z981 Arthrodesis status: Secondary | ICD-10-CM | POA: Diagnosis not present

## 2018-11-29 DIAGNOSIS — R197 Diarrhea, unspecified: Secondary | ICD-10-CM

## 2018-11-29 DIAGNOSIS — Z8673 Personal history of transient ischemic attack (TIA), and cerebral infarction without residual deficits: Secondary | ICD-10-CM | POA: Diagnosis not present

## 2018-11-29 DIAGNOSIS — Z7984 Long term (current) use of oral hypoglycemic drugs: Secondary | ICD-10-CM | POA: Diagnosis not present

## 2018-11-29 DIAGNOSIS — Z79899 Other long term (current) drug therapy: Secondary | ICD-10-CM | POA: Diagnosis not present

## 2018-11-29 DIAGNOSIS — Z66 Do not resuscitate: Secondary | ICD-10-CM | POA: Diagnosis present

## 2018-11-29 DIAGNOSIS — E039 Hypothyroidism, unspecified: Secondary | ICD-10-CM | POA: Diagnosis not present

## 2018-11-29 DIAGNOSIS — E860 Dehydration: Secondary | ICD-10-CM | POA: Diagnosis present

## 2018-11-29 DIAGNOSIS — Z888 Allergy status to other drugs, medicaments and biological substances status: Secondary | ICD-10-CM | POA: Diagnosis not present

## 2018-11-29 DIAGNOSIS — E782 Mixed hyperlipidemia: Secondary | ICD-10-CM | POA: Diagnosis present

## 2018-11-29 DIAGNOSIS — Z79890 Hormone replacement therapy (postmenopausal): Secondary | ICD-10-CM | POA: Diagnosis not present

## 2018-11-29 DIAGNOSIS — R4182 Altered mental status, unspecified: Secondary | ICD-10-CM | POA: Diagnosis not present

## 2018-11-29 DIAGNOSIS — K921 Melena: Secondary | ICD-10-CM | POA: Diagnosis not present

## 2018-11-29 DIAGNOSIS — E871 Hypo-osmolality and hyponatremia: Secondary | ICD-10-CM | POA: Diagnosis not present

## 2018-11-29 DIAGNOSIS — E785 Hyperlipidemia, unspecified: Secondary | ICD-10-CM | POA: Diagnosis present

## 2018-11-29 DIAGNOSIS — K5731 Diverticulosis of large intestine without perforation or abscess with bleeding: Secondary | ICD-10-CM

## 2018-11-29 DIAGNOSIS — E119 Type 2 diabetes mellitus without complications: Secondary | ICD-10-CM | POA: Diagnosis not present

## 2018-11-29 DIAGNOSIS — Z881 Allergy status to other antibiotic agents status: Secondary | ICD-10-CM | POA: Diagnosis not present

## 2018-11-29 DIAGNOSIS — E872 Acidosis: Secondary | ICD-10-CM | POA: Diagnosis present

## 2018-11-29 DIAGNOSIS — K51511 Left sided colitis with rectal bleeding: Secondary | ICD-10-CM | POA: Diagnosis present

## 2018-11-29 DIAGNOSIS — Z87891 Personal history of nicotine dependence: Secondary | ICD-10-CM | POA: Diagnosis not present

## 2018-11-29 DIAGNOSIS — Z79891 Long term (current) use of opiate analgesic: Secondary | ICD-10-CM | POA: Diagnosis not present

## 2018-11-29 DIAGNOSIS — I10 Essential (primary) hypertension: Secondary | ICD-10-CM | POA: Diagnosis present

## 2018-11-29 LAB — OSMOLALITY: Osmolality: 274 mOsm/kg — ABNORMAL LOW (ref 275–295)

## 2018-11-29 LAB — COMPREHENSIVE METABOLIC PANEL
ALK PHOS: 261 U/L — AB (ref 38–126)
ALT: 15 U/L (ref 0–44)
AST: 27 U/L (ref 15–41)
Albumin: 2.9 g/dL — ABNORMAL LOW (ref 3.5–5.0)
Anion gap: 11 (ref 5–15)
BUN: 15 mg/dL (ref 8–23)
CO2: 18 mmol/L — ABNORMAL LOW (ref 22–32)
Calcium: 7.7 mg/dL — ABNORMAL LOW (ref 8.9–10.3)
Chloride: 97 mmol/L — ABNORMAL LOW (ref 98–111)
Creatinine, Ser: 0.93 mg/dL (ref 0.44–1.00)
GFR calc Af Amer: >60 mL/min (ref >60)
GFR calc non Af Amer: >60 mL/min (ref >60)
Glucose, Bld: 162 mg/dL — ABNORMAL HIGH (ref 70–99)
Potassium: 4.1 mmol/L (ref 3.5–5.1)
Sodium: 126 mmol/L — ABNORMAL LOW (ref 135–145)
Total Bilirubin: 0.6 mg/dL (ref 0.3–1.2)
Total Protein: 5.8 g/dL — ABNORMAL LOW (ref 6.5–8.1)

## 2018-11-29 LAB — CBC
HEMATOCRIT: 42.5 % (ref 36.0–46.0)
Hemoglobin: 13.1 g/dL (ref 12.0–15.0)
MCH: 26.4 pg (ref 26.0–34.0)
MCHC: 30.8 g/dL (ref 30.0–36.0)
MCV: 85.7 fL (ref 80.0–100.0)
Platelets: 343 10*3/uL (ref 150–400)
RBC: 4.96 MIL/uL (ref 3.87–5.11)
RDW: 15.1 % (ref 11.5–15.5)
WBC: 19.9 10*3/uL — ABNORMAL HIGH (ref 4.0–10.5)
nRBC: 0 % (ref 0.0–0.2)

## 2018-11-29 LAB — LACTIC ACID, PLASMA: Lactic Acid, Venous: 3.4 mmol/L (ref 0.5–1.9)

## 2018-11-29 LAB — TSH: TSH: 2.298 u[IU]/mL (ref 0.350–4.500)

## 2018-11-29 LAB — HEMOGLOBIN AND HEMATOCRIT, BLOOD
HCT: 35.9 % — ABNORMAL LOW (ref 36.0–46.0)
Hemoglobin: 11.5 g/dL — ABNORMAL LOW (ref 12.0–15.0)

## 2018-11-29 LAB — OCCULT BLOOD X 1 CARD TO LAB, STOOL: Fecal Occult Bld: POSITIVE — AB

## 2018-11-29 MED ORDER — CITALOPRAM HYDROBROMIDE 20 MG PO TABS
20.0000 mg | ORAL_TABLET | ORAL | Status: DC
  Administered 2018-11-29 – 2018-12-01 (×2): 20 mg via ORAL
  Filled 2018-11-29 (×4): qty 1

## 2018-11-29 MED ORDER — LORATADINE 10 MG PO TABS
10.0000 mg | ORAL_TABLET | Freq: Every day | ORAL | Status: DC
  Administered 2018-11-29 – 2018-12-02 (×4): 10 mg via ORAL
  Filled 2018-11-29 (×4): qty 1

## 2018-11-29 MED ORDER — ACETAMINOPHEN 325 MG PO TABS
650.0000 mg | ORAL_TABLET | Freq: Four times a day (QID) | ORAL | Status: DC | PRN
  Administered 2018-11-29 – 2018-12-02 (×5): 650 mg via ORAL
  Filled 2018-11-29 (×5): qty 2

## 2018-11-29 MED ORDER — LEVOTHYROXINE SODIUM 88 MCG PO TABS
88.0000 ug | ORAL_TABLET | Freq: Every day | ORAL | Status: DC
  Administered 2018-11-30 – 2018-12-02 (×3): 88 ug via ORAL
  Filled 2018-11-29 (×5): qty 1

## 2018-11-29 MED ORDER — ENOXAPARIN SODIUM 40 MG/0.4ML ~~LOC~~ SOLN
40.0000 mg | SUBCUTANEOUS | Status: DC
  Administered 2018-11-29: 40 mg via SUBCUTANEOUS
  Filled 2018-11-29: qty 0.4

## 2018-11-29 MED ORDER — VITAMIN B-12 1000 MCG PO TABS
1000.0000 ug | ORAL_TABLET | Freq: Every day | ORAL | Status: DC
  Administered 2018-11-29 – 2018-12-02 (×4): 1000 ug via ORAL
  Filled 2018-11-29 (×6): qty 1

## 2018-11-29 MED ORDER — ONDANSETRON HCL 4 MG/2ML IJ SOLN
4.0000 mg | Freq: Four times a day (QID) | INTRAMUSCULAR | Status: DC | PRN
  Administered 2018-11-29: 4 mg via INTRAVENOUS
  Filled 2018-11-29: qty 2

## 2018-11-29 MED ORDER — ACETAMINOPHEN 650 MG RE SUPP: 650.0000 mg | Freq: Four times a day (QID) | RECTAL | Status: DC | PRN

## 2018-11-29 MED ORDER — ATENOLOL 25 MG PO TABS
100.0000 mg | ORAL_TABLET | Freq: Every morning | ORAL | Status: DC
  Administered 2018-11-29 – 2018-12-02 (×4): 100 mg via ORAL
  Filled 2018-11-29 (×4): qty 4

## 2018-11-29 MED ORDER — SODIUM CHLORIDE 0.9 % IV SOLN
INTRAVENOUS | Status: DC
  Administered 2018-11-29 – 2018-12-02 (×6): via INTRAVENOUS

## 2018-11-29 MED ORDER — FOLIC ACID 1 MG PO TABS
1000.0000 ug | ORAL_TABLET | Freq: Every day | ORAL | Status: DC
  Administered 2018-11-29 – 2018-12-02 (×4): 1 mg via ORAL
  Filled 2018-11-29 (×4): qty 1

## 2018-11-29 MED ORDER — CIPROFLOXACIN IN D5W 400 MG/200ML IV SOLN
400.0000 mg | Freq: Two times a day (BID) | INTRAVENOUS | Status: DC
  Administered 2018-11-29 – 2018-11-30 (×4): 400 mg via INTRAVENOUS
  Filled 2018-11-29 (×5): qty 200

## 2018-11-29 MED ORDER — CYCLOBENZAPRINE HCL 10 MG PO TABS
10.0000 mg | ORAL_TABLET | Freq: Two times a day (BID) | ORAL | Status: DC | PRN
  Administered 2018-11-29 – 2018-12-02 (×6): 10 mg via ORAL
  Filled 2018-11-29 (×6): qty 1

## 2018-11-29 MED ORDER — ONDANSETRON HCL 4 MG PO TABS: 4.0000 mg | ORAL_TABLET | Freq: Four times a day (QID) | ORAL | Status: DC | PRN

## 2018-11-29 MED ORDER — IBUPROFEN 400 MG PO TABS
400.0000 mg | ORAL_TABLET | Freq: Four times a day (QID) | ORAL | Status: DC | PRN
  Administered 2018-11-29: 400 mg via ORAL
  Filled 2018-11-29: qty 1

## 2018-11-29 NOTE — Progress Notes (Signed)
Patient had BM, bright red blood noted, moderate amount.

## 2018-11-29 NOTE — Consult Note (Signed)
Referring Provider: [facility] Primary Care Physician:  [name], MD Primary Gastroenterologist:  Dr. Gala Romney  Date of Admission: 11/28/2018 Date of Consultation: 11/29/2018  Reason for Consultation:  Colitis, heme+ stool  HPI:  Brittany Goodman is a 73 y.o. female with a past medical history of diverticulosis, GERD, hypertension, pyelonephritis sepsis, sleep apnea, CVA without deficits.  He is currently postop day 2 status post spinal fusion C3-4 at [facility] who presented to the emergency department with acute onset altered mental status.  The patient apparently had a near-syncopal episode in the bathroom with associated weakness.  She had been complaining of abdominal cramping and diarrhea.  In the emergency department she was somnolent.  Pertinent labs found elevated lactic acid at 3.36, negative C. difficile quick scan, blood cultures negative to date (less than 12 hours).  CBC did find leukocytosis with white blood cell count of 19.9.  CMP found elevated alkaline phosphatase at 357, other liver function testing normal.  Urine culture in process but urinalysis normal.   CT the head was normal.  CT of the abdomen and pelvis found colitis extending from the mid transverse colon through the rectum likely infectious or inflammatory.  Recommended consider C. difficile.  Distribution deemed not consistent with ischemic colitis.  Mild hepatic steatosis and colonic diverticulosis without focal diverticulitis.  CT of the spine found changes consistent with normal expected postoperative changes status post surgical intervention.  Overall presentation felt more consistent with global encephalopathy and somnolence in the setting of hypotension and doubt infectious source.  After becoming more awake and aware felt her abdomen cramping and diarrhea were more her primary concerns.  The patient was admitted for further evaluation and treatment.  The patient's primary diagnosis was  colitis and she was started on Cipro, Flagyl IV.  Undergoing replacement of hyponatremia (initially found to be 121) felt likely due to GI loss secondary to diarrhea.  However by the time of admission diarrhea had slowed.  Still with crampy abdominal pain.  GI pathogen panel ordered and pending.  The hospitalist spoke with the patient son who noted the patient stools were dark and might be bloody.  An iFOBT was ordered and found to be positive.  Additionally just after noon today the patient had a bowel movement and there was noted bright red blood, moderate amount.  Subsequently GI was consulted.  Labs today show no improvement in white blood cell count which remains elevated at 19.9, despite antibiotics.  Lactic acid also remains elevated at 3.4 despite fluid treatments.  CMP shows improvement in sodium to 126, normal creatinine, AST/ALT/bilirubin normal, alkaline phosphatase improved to 261 (it was 357 yesterday).  Of note the patient's last colonoscopy was just recently completed on 07/26/2018 for hematochezia. 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 Recommended no future colonoscopy unless symptoms develop.  Today she is accompanied at the bedside by her son. She was having some episodic confusion earlier but this seems to have cleared.  The patient states she began having abdominal cramping, better described as "discomfort" yesterday.  This about the time she began having rectal bleeding.  She is previously had trace or low-volume rectal bleeding which is why her colonoscopy was recently completed.  She feels this is different.  She has passed at least 2 bowel movements while in the hospital that were described as "straight  blood".  The nurse confirms this stating she just had a bowel movement that was essentially pure blood.  Her  hemoglobin is still normal although she did have declined from 16-13.  Possible some hydration effect but not much given her current blood pressure and other indices.  She did have one episode of vomiting yesterday.  Denies any further nausea or vomiting.  She did have a CVA about 2 and half years ago.  No other atherosclerotic disease including cardiac disease, lower extremity circulation issues.  She is not diabetic.  It is important to note that she is a Restaurant manager, fast food and is stated she will not accept blood products. No other GI complaints.  Past Medical History:  Diagnosis Date  . Arthritis   . Depression   . Diabetes mellitus type II   . Diverticulosis   . GERD (gastroesophageal reflux disease)   . Hyperlipidemia   . Hypertension   . Hypothyroidism   . Sepsis ([place])    2011, Escherichia coli pyelonephritis  . Sleep apnea    Does not use CPAP. Cannot tolerate  . Spinal stenosis   . Stroke [facility])    no deficits    Past Surgical History:  Procedure Laterality Date  . BACK SURGERY    . CARPAL TUNNEL RELEASE Right   . COLONOSCOPY N/A 05/29/2013   Focal left colonic inflammation, likely remnant of recent bout of ischemic or segmental colitis, s/p biopsy. pancolonic diverticulosis. Due for surveillance in 5 years  . COLONOSCOPY WITH PROPOFOL N/A 07/26/2018   Procedure: COLONOSCOPY WITH PROPOFOL;  Surgeon: Daneil Dolin, MD;  [facility];  Service: Endoscopy;  Laterality: N/A;  8:30am  . ESOPHAGOGASTRODUODENOSCOPY (EGD) WITH ESOPHAGEAL DILATION N/A 05/29/2013   normal appearing patent tubular esophagus, small hiatal hernia, multiple antral erosions. no ulcer. normlal duodenum. empiric dilation  . ILIOTIBIAL BAND RELEASE Left   . POLYPECTOMY  07/26/2018   Procedure: POLYPECTOMY;  Surgeon: Daneil Dolin, MD;  Location: [facility];  Service: Endoscopy;;  hepatic flexure polyp  . TOTAL ABDOMINAL HYSTERECTOMY      Prior to Admission medications   Medication Sig Start Date  End Date Taking? Authorizing Provider  acetaminophen (TYLENOL) 500 MG tablet Take 1,000 mg by mouth 2 (two) times daily as needed for mild pain or moderate pain.    Yes [provider]  Ascorbic Acid (VITAMIN C) 1000 MG tablet Take 1,000-2,000 mg by mouth daily.    Yes [provider]  atenolol (TENORMIN) 100 MG tablet Take 100 mg by mouth every morning.    Yes [provider]  Biotin 5000 MCG TABS Take 1 tablet by mouth daily.   Yes [provider]  Cholecalciferol (VITAMIN D) 125 MCG (5000 UT) CAPS Take 1 capsule by mouth daily.    Yes [provider]  citalopram (CELEXA) 20 MG tablet Take 20 mg by mouth every other day.    Yes [provider]  cyclobenzaprine (FLEXERIL) 10 MG tablet Take 10 mg by mouth 2 (two) times daily as needed for muscle spasms.    Yes [provider]  folic acid (FOLVITE) 876 MCG tablet Take 800 mcg by mouth daily.    Yes [provider]  glimepiride (AMARYL) 1 MG tablet Take 1 mg by mouth daily before breakfast.  04/23/13  Yes [provider]  HYDROcodone-acetaminophen (NORCO/VICODIN) 5-325 MG tablet Take 1 tablet by mouth every 4 (four) hours as needed for moderate pain.  03/10/17  Yes [provider]  levothyroxine (SYNTHROID, LEVOTHROID) 88 MCG tablet Take 88 mcg by mouth daily before breakfast.  01/18/17  Yes [provider]  lisinopril (PRINIVIL,ZESTRIL) 5 MG tablet Take 5 mg by mouth daily.  03/09/13  Yes [provider]  loratadine (CLARITIN) 10 MG tablet Take 10 mg by mouth daily.   Yes [provider]  metFORMIN (GLUCOPHAGE) 1000 MG tablet Take 1,000 mg by mouth 2 (two) times daily with a meal.  12/01/16  Yes [provider]  NONFORMULARY OR COMPOUNDED ITEM Apply 1 application topically 3 (three) times daily as needed (Pain). Diclofenac, Baclofen, Gabapentin, Lidocaine, Menthol - Compounded at [facility]   Yes [provider]   omeprazole (PRILOSEC) 20 MG capsule Take 20 mg by mouth daily.    Yes [provider]  polyethylene glycol (MIRALAX / GLYCOLAX) packet Take 1 packet by mouth once as needed. For constipation 11/28/18 12/01/18 Yes [provider]  vitamin B-12 (CYANOCOBALAMIN) 1000 MCG tablet Take 1,000 mcg by mouth daily.   Yes [provider]    Current Facility-Administered Medications  Medication Dose Route Frequency Provider Last Rate Last Dose  . 0.9 %  sodium chloride infusion   Intravenous Continuous Oswald Hillock, MD 100 mL/hr at 11/29/18 0931    . acetaminophen (TYLENOL) tablet 650 mg  650 mg Oral Q6H PRN Oswald Hillock, MD       Or  . acetaminophen (TYLENOL) suppository 650 mg  650 mg Rectal Q6H PRN Oswald Hillock, MD      . atenolol (TENORMIN) tablet 100 mg  100 mg Oral q morning - 10a Oswald Hillock, MD   100 mg at 11/29/18 0914  . ciprofloxacin (CIPRO) IVPB 400 mg  400 mg Intravenous Q12H Oswald Hillock, MD 200 mL/hr at 11/29/18 0755 400 mg at 11/29/18 0755  . citalopram (CELEXA) tablet 20 mg  20 mg Oral Nilda Simmer, MD   20 mg at 11/29/18 0914  . cyclobenzaprine (FLEXERIL) tablet 10 mg  10 mg Oral BID PRN Oswald Hillock, MD   10 mg at 11/29/18 0914  . folic acid (FOLVITE) tablet 1 mg  1,000 mcg Oral Daily Oswald Hillock, MD   1 mg at 11/29/18 0914  . levothyroxine (SYNTHROID, LEVOTHROID) tablet 88 mcg  88 mcg Oral Q0600 Oswald Hillock, MD      . loratadine (CLARITIN) tablet 10 mg  10 mg Oral Daily Oswald Hillock, MD   10 mg at 11/29/18 0914  . metroNIDAZOLE (FLAGYL) IVPB 500 mg  500 mg Intravenous Q8H Oswald Hillock, MD 100 mL/hr at 11/29/18 1113 500 mg at 11/29/18 1113  . ondansetron (ZOFRAN) tablet 4 mg  4 mg Oral Q6H PRN Oswald Hillock, MD       Or  . ondansetron [facility]) injection 4 mg  4 mg Intravenous Q6H PRN Oswald Hillock, MD   4 mg at 11/29/18 0914  . vitamin B-12 (CYANOCOBALAMIN) tablet 1,000 mcg  1,000 mcg Oral Daily Oswald Hillock, MD   1,000 mcg at 11/29/18 1113     Allergies as of 11/28/2018 - Review Complete 11/28/2018  Allergen Reaction Noted  . Celecoxib Swelling 11/28/2018  . Codeine Other (See Comments) 08/04/2009  . Dilaudid [hydromorphone hcl] Other (See Comments) 05/06/2013  . Morphine Nausea And Vomiting 08/04/2009  . Statins  11/28/2018  . Tetracycline Nausea And Vomiting 08/04/2009    Family History  Problem Relation Age of Onset  . Heart disease Mother  MI  . Heart disease Father   . Heart attack Father 61  . Heart disease Brother   . Cancer Brother        ADRENAL GLAND  . Colon polyps Brother   . Liver cancer Brother 16       deceased , ?metastatic cancer?   . Heart disease Maternal Grandfather        MI  . Cancer Paternal Grandfather        LUNG  . Colon cancer Neg Hx     Social History   Socioeconomic History  . Marital status: Widowed    Spouse name: Not on file  . Number of children: 2  . Years of education: GED  . Highest education level: Not on file  Occupational History  . 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 Financial resource strain: Not on file  . Food insecurity:    Worry: Not on file    Inability: Not on file  . Transportation needs:    Medical: Not on file    Non-medical: Not on file  Tobacco Use  . Smoking status: Former Smoker    Packs/day: 2.00    Years: 13.00    Pack years: 26.00    Last attempt to quit: 07/19/1966    Years since quitting: 52.4  . Smokeless tobacco: Former Systems developer    Types: Chew  Substance and Sexual Activity  . Alcohol use: No  . Drug use: No  . Sexual activity: Not Currently    Birth control/protection: Surgical  Lifestyle  . Physical activity:    Days per week: Not on file    Minutes per session: Not on file  . Stress: Not on file  Relationships  . Social connections:    Talks on phone: Not on file    Gets together: Not on file    Attends religious service: Not on file    Active member of club or organization:  Not on file    Attends meetings of clubs or organizations: Not on file    Relationship status: Not on file  . Intimate partner violence:    Fear of current or ex partner: Not on file    Emotionally abused: Not on file    Physically abused: Not on file    Forced sexual activity: Not on file  Other Topics Concern  . Not on file  Social History Narrative   Lives with son   Caffeine use: Coffee daily   Right handed   No regular exercise    Review of Systems: General: Negative for anorexia, weight loss, fever, chills, fatigue, weakness. ENT: Negative for hoarseness, difficulty swallowing. CV: Negative for chest pain, angina, palpitations, peripheral edema.  Respiratory: Negative for dyspnea at rest, cough, sputum, wheezing.  GI: See history of present illness. MS: Negative for joint pain, low back pain.  Derm: Negative for rash or itching.  Endo: Negative for unusual weight change.  Heme: Negative for bruising or bleeding. Allergy: Negative for rash or hives.  Physical Exam: Vital signs in last 24 hours: Temp:  [97.7 F (36.5 C)] 97.7 F (36.5 C) (01/09 0850) Pulse Rate:  [30-77] 72 (01/09 0850) Resp:  [12-21] 20 (01/09 0850) BP: (74-131)/(54-94) 87/57 (01/09 0850) SpO2:  [95 %-100 %] 98 % (01/09 0850) Weight:  [81.6 kg] 81.6 kg (01/08 1906) Last BM Date: 11/28/18 General:   Alert,  Well-developed, well-nourished, pleasant and cooperative in NAD Head:  Normocephalic and atraumatic.  Eyes:  Sclera clear, no icterus. Conjunctiva pink. Ears:  Normal auditory acuity. Neck:  Supple; no masses or thyromegaly. Lungs:  Clear throughout to auscultation.   No wheezes, crackles, or rhonchi. No acute distress. Heart:  Regular rate and rhythm; no murmurs, clicks, rubs,  or gallops. Abdomen:  Soft, and nondistended. Moderate to significant TTP noted. No masses, hepatosplenomegaly or hernias noted. Normal bowel sounds, without guarding, and without rebound.   Rectal:  Deferred.   Msk:   Symmetrical without gross deformities. Pulses:  Normal bilateral DP pulses noted. Extremities:  Without clubbing or edema. Neurologic:  Alert and  oriented x4;  grossly normal neurologically. Skin:  Intact without significant lesions or rashes. Psych:  Alert and cooperative. Normal mood and affect.  Intake/Output from previous day: 01/08 0701 - 01/09 0700 In: 1436.3 [IV Piggyback:1436.3] Out: -  Intake/Output this shift: Total I/O In: 100 [IV Piggyback:100] Out: -   Lab Results: Recent Labs    11/28/18 2002 11/29/18 0514  WBC 19.9* 19.9*  HGB 16.0* 13.1  HCT 51.8* 42.5  PLT 300 343   BMET Recent Labs    11/28/18 2002 11/29/18 0514  NA 121* 126*  K 4.4 4.1  CL 94* 97*  CO2 14* 18*  GLUCOSE 134* 162*  BUN 15 15  CREATININE 0.97 0.93  CALCIUM 8.2* 7.7*   LFT Recent Labs    11/28/18 2002 11/29/18 0514  PROT 6.8 5.8*  ALBUMIN 3.4* 2.9*  AST 32 27  ALT 16 15  ALKPHOS 357* 261*  BILITOT 0.7 0.6   PT/INR No results for input(s): LABPROT, INR in the last 72 hours. Hepatitis Panel No results for input(s): HEPBSAG, HCVAB, HEPAIGM, HEPBIGM in the last 72 hours. C-Diff Recent Labs    11/28/18 1915  CDIFFTOX NEGATIVE    Studies/Results: Ct Head Wo Contrast  Result Date: 11/28/2018 CLINICAL DATA:  Altered mental status EXAM: CT HEAD WITHOUT CONTRAST TECHNIQUE: Contiguous axial images were obtained from the base of the skull through the vertex without intravenous contrast. COMPARISON:  Head CT 03/01/2017 FINDINGS: Brain: No acute territorial infarction, hemorrhage, or intracranial mass is visualized. The ventricles are nonenlarged. Vascular: No hyperdense vessels.  Carotid vascular calcification Skull: Normal. Negative for fracture or focal lesion. Sinuses/Orbits: Mucosal thickening in the sphenoid sinus on the right side. Small debris in the posterior left nasal passage. Other: None IMPRESSION: Negative.  No CT evidence for acute intracranial abnormality.  Electronically Signed   By: Donavan Foil M.D.   On: 11/28/2018 18:15   Ct Cervical Spine Wo Contrast  Result Date: 11/28/2018 CLINICAL DATA:  Initial evaluation for recent cervical spine fusion, pain. EXAM: CT CERVICAL SPINE WITHOUT CONTRAST TECHNIQUE: Multidetector CT imaging of the cervical spine was performed without intravenous contrast. Multiplanar CT image reconstructions were also generated. COMPARISON:  Prior MRI from 09/13/2018. FINDINGS: Alignment: Straightening with slight reversal of the normal cervical lordosis, apex at C4-5. Trace anterolisthesis of C3 on C4, and C5 on C6, most likely chronic and degenerative. Skull base and vertebrae: Skull base intact. Mild rotation of C1 on C2 likely positional. Normal C1-2 articulations otherwise preserved. Dens intact. Vertebral body heights maintained. No acute fracture. Soft tissues and spinal canal: Prevertebral edema with layering effusion and soft tissue emphysema, most likely postoperative in nature related to recent C3-4 ACDF. No frank loculated collections identified. Additional soft tissue stranding seen along the left-sided neck approach. No complication or adverse features. Spinal canal within normal limits. Vascular calcifications noted about the carotid bifurcations. Disc levels: Recent  ACDF at C3-4. Hardware appears intact and well position without complication small amount of postoperative gas present within the C3-4 interspace. Additional moderate cervical spondylolysis at C5-6 and C6-7. Multilevel facet arthrosis, most notable at C3-4 on the left and C5-6 on the right. Upper chest: Loculated collection partially visualized about the right shoulder (series 4, image 86), indeterminate, not well assessed on this examination. Visualized upper chest demonstrates no other acute finding. Mild atelectatic changes present at the left lung apex. Visualized lungs are otherwise clear. Other: None. IMPRESSION: 1. No acute abnormality within the cervical  spine. 2. Postoperative changes from recent ACDF at F3-5 without complication. Prevertebral swelling and effusion with soft tissue emphysema felt to be consistent with normal expected postoperative changes. 3. Additional moderate degenerative spondylolysis at C5-6 and C6-7, with multilevel facet arthrosis as above. 4. Partially visualized loculated hypodense collection about the right shoulder as above, indeterminate. Further evaluation with dedicated plain film radiography and/or cross-sectional imaging suggested as clinically warranted. Electronically Signed   By: Jeannine Boga M.D.   On: 11/28/2018 23:09   Ct Abdomen Pelvis W Contrast  Result Date: 11/28/2018 CLINICAL DATA:  Acute abdominal pain. Patient reports spinal fusion at [facility] two days ago. EXAM: CT ABDOMEN AND PELVIS WITH CONTRAST TECHNIQUE: Multidetector CT imaging of the abdomen and pelvis was performed using the standard protocol following bolus administration of intravenous contrast. CONTRAST:  141m ISOVUE-300 IOPAMIDOL (ISOVUE-300) INJECTION 61% COMPARISON:  Abdominal CTA 06/08/2018 FINDINGS: Lower chest: Mild hypoventilatory atelectasis. No pleural fluid or confluent airspace disease. There are coronary artery calcifications. Hepatobiliary: Mild hepatic steatosis without focal hepatic abnormality. Gallbladder distention without calcified gallstone, pericholecystic inflammation, or biliary dilatation. Pancreas: No ductal dilatation or inflammation. Spleen: Normal in size without focal abnormality. Adrenals/Urinary Tract: Normal adrenal glands. No hydronephrosis or perinephric edema. Homogeneous renal enhancement with symmetric excretion on delayed phase imaging. Urinary bladder is distended without wall thickening. Stomach/Bowel: Colonic wall thickening with pericolonic edema extending from the mid transverse colon through the rectum. Liquid stool throughout the entire colon. No pneumatosis or perforation. Colonic diverticulosis without  focal Peri diverticular inflammation. Appendix is not confidently visualized. No small bowel dilatation, obstruction, or inflammatory change. Stomach is unremarkable. Vascular/Lymphatic: Aortic and branch atherosclerosis. Calcifications at the origin of the celiac and SMA arteries. No evidence of acute vascular abnormality. Portal vein is patent. Reproductive: Status post hysterectomy. No adnexal masses. Other: Small volume of free fluid in the pelvis, pericolic gutters, and upper abdomen. No free air. No loculated fluid or intra-abdominal abscess. Tiny fat containing umbilical hernia. Musculoskeletal: Scoliosis and multilevel degenerative change throughout spine. There are no acute or suspicious osseous abnormalities. IMPRESSION: 1. Colitis extending from the mid transverse colon through the rectum, likely infectious or inflammatory. Consider C diff. The distribution is not consistent with ischemic colitis. 2. Colonic diverticulosis without evidence of focal diverticulitis. 3. Mild hepatic steatosis. 4.  Aortic Atherosclerosis (ICD10-I70.0). Electronically Signed   By: MKeith RakeM.D.   On: 11/28/2018 22:29   Dg Chest Portable 1 View  Result Date: 11/28/2018 CLINICAL DATA:  Altered mental status EXAM: PORTABLE CHEST 1 VIEW COMPARISON:  02/28/2017 FINDINGS: Hypoventilation with decreased lung volume and mild bibasilar atelectasis. Negative for heart failure or effusion. Negative for pneumonia. IMPRESSION: Hypoventilation with bibasilar atelectasis. Electronically Signed   By: CFranchot GalloM.D.   On: 11/28/2018 18:29    Impression: Very pleasant 73year old female who presented initially with acute mental status changes and hypotension with near syncopal episode at home.  She is 2  days postop cervical spinal surgery at [facility].  When her mentation improved she indicated her biggest concern is abdominal discomfort associated with moderate to large volume hematochezia.  These were started yesterday.  In the  hospital today she has had at least 2 confirmed bowel movements which are moderate to large bloody bowel movements.  Previously with diarrhea which seems to have improved.  C. difficile negative.  She does have some vascular disease with a CVA 2 and half years ago but no other known cardiovascular disease.  She does have known diverticula.  Recent colonoscopy in 2019 with a single polyp and noted diverticula with no future colonoscopies unless symptoms present themselves.  Moderate volume hematochezia in the setting of abdominal cramping, lactic acidosis, and hypotension.  There is a possibility of ischemic colitis brought on by hypotension with possible mild atherosclerotic disease.  Infectious colitis remains in the differentials as well. Also possible diverticular bleed, although this typically does not present with significant abdominal pain.  Less likely benign anorectal source, bleeding polyps, carcinoma.  We will discuss neck steps with Dr. Gala Romney.  Plan: 1. Check H/H this afternoon around 1500; follow h/h closely. 2. Continue antibiotics for now 3. After discussion with Dr. Gala Romney, no need for colonoscopy at this time 4. Close monitoring 5. Supportive measures 6. Follow H&H closely   Thank you for allowing [place] to participate in the care of [place], DNP, AGNP-C Adult & Gerontological Nurse Practitioner [facility]    LOS: 0 days     11/29/2018, 1:13 PM

## 2018-11-29 NOTE — Progress Notes (Signed)
Patient had another BM, moderate blood noted, bright red.

## 2018-11-29 NOTE — Progress Notes (Signed)
Son just notified this nurse that patient has blood in her stool when she goes to the bathroom. MD notified as well.

## 2018-11-29 NOTE — Progress Notes (Signed)
Patient has another BM, with moderate amount of bright red blood.

## 2018-11-29 NOTE — Progress Notes (Signed)
CRITICAL VALUE ALERT  Critical Value:  Lactic acid  3.4  Date & Time Notied:  11/29/18  Provider Notified: Tawanna Solo   Orders Received/Actions taken:

## 2018-11-29 NOTE — Progress Notes (Addendum)
Patient is a 69-year female with past medical history of diabetes mellitus, hyperlipidemia, hypertension, hypothyroidism, CVA, recent cervical spinal surgery who presented here with complaints of generalized weakness, abdominal cramping and multiple episodes of diarrhea.  She was just discharged from [facility] after the spinal surgery. CT abdomen pelvis showed Colitis extending from the mid transverse colon through the rectum, likely infectious or inflammatory.  C. difficile suspected but C. difficile PCR was negative. She was found to have leukocytosis, lactic acidosis. Started management for possible infectious colitis.  Started on Cipro and Flagyl.  She was also noted to have hyponatremia with sodium of 121 which is improving.  Most likely the hyponatremia is associated with GI loss secondary to diarrhea.  Patient has been started on normal saline. Patient seen and examined the bedside in the emergency department.  Currently she is hemodynamically stable.  Diarrhea has slowed down.  She Still complains of crampy abdominal pain. Examination revealed elderly debilitated female.  Found to have mild  generalized tenderness on the abdomen. We will check GI pathogen panel.Cultures also sent. Extensive discussion held at the bedside with the sister about the current situation and further management plan. Patient seen by Dr. Darrick Meigs this morning.  We will continue to monitor the patient.  Addendum:  Talked to the son this afternoon on phone.  He said that patient's stools are dark and might be bloody.  I have requested RN to do FOBT test. I am also contacting Dr Gala Romney ,GI.  Discussed with Dr. Gala Romney.  He will see her today.  Keep her n.p.o. after midnight. There is high probability that she might have ischemic colitis given the bloody bowel movements,lactic acidosis. We will continue current management.

## 2018-11-29 NOTE — H&P (Signed)
TRH H&P    Patient Demographics:    Brittany Goodman, is a 73 y.o. female  MRN: [id number]  DOB - 10-Mar-1946  Admit Date - 11/28/2018  Referring MD/NP/PA: Nanda Quinton  Outpatient Primary MD for the patient is [name], MD  Patient coming from: Home  Chief complaint-abdominal pain   HPI:    Brittany Goodman  is a 73 y.o. female, with history of diabetes mellitus, hyperlipidemia, hypertension, hypothyroidism, prior CVA, who had recent cervical spine surgery C3-4 for cervical spondylosis with myelopathy at [facility].  Patient was taking pain medications sparingly and overall improving.  At 11:30 AM she told her son that she needs to go to bathroom.  She went downstairs to her bathroom with some assistance and submitted for her.  He states that he heard a thud against a wall and went to see patient with weakness and leaning against a wall.  She had not fallen.  Did not pass out.  Patient has been complaining of abdominal cramping. When patient came to the ED she had multiple episodes of diarrhea. Her mental status improved after she received Narcan. CT scan showed inflammation of the transverse colon to rectum C. difficile PCR was negative Patient was started on cefepime, vancomycin and Flagyl for possible sepsis initially. She denies chest pain or shortness of breath. She had 2 episodes of vomiting. Denies dysuria. No previous history of stroke or seizures.    Review of systems:    In addition to the HPI above,  .  All other systems reviewed and are negative.    Past History of the following :    Past Medical History:  Diagnosis Date  . Arthritis   . Depression   . Diabetes mellitus type II   . Diverticulosis   . GERD (gastroesophageal reflux disease)   . Hyperlipidemia   . Hypertension   . Hypothyroidism   . Sepsis ([place])    2011, Escherichia coli pyelonephritis  . Sleep apnea    Does not  use CPAP. Cannot tolerate  . Spinal stenosis   . Stroke [facility])    no deficits      Past Surgical History:  Procedure Laterality Date  . BACK SURGERY    . CARPAL TUNNEL RELEASE Right   . COLONOSCOPY N/A 05/29/2013   Focal left colonic inflammation, likely remnant of recent bout of ischemic or segmental colitis, s/p biopsy. pancolonic diverticulosis. Due for surveillance in 5 years  . COLONOSCOPY WITH PROPOFOL N/A 07/26/2018   Procedure: COLONOSCOPY WITH PROPOFOL;  Surgeon: Daneil Dolin, MD;  Location: [facility];  Service: Endoscopy;  Laterality: N/A;  8:30am  . ESOPHAGOGASTRODUODENOSCOPY (EGD) WITH ESOPHAGEAL DILATION N/A 05/29/2013   normal appearing patent tubular esophagus, small hiatal hernia, multiple antral erosions. no ulcer. normlal duodenum. empiric dilation  . ILIOTIBIAL BAND RELEASE Left   . POLYPECTOMY  07/26/2018   Procedure: POLYPECTOMY;  Surgeon: Daneil Dolin, MD;  Location: [facility];  Service: Endoscopy;;  hepatic flexure polyp  . TOTAL ABDOMINAL HYSTERECTOMY  Social History:      Social History   Tobacco Use  . Smoking status: Former Smoker    Packs/day: 2.00    Years: 13.00    Pack years: 26.00    Last attempt to quit: 07/19/1966    Years since quitting: 52.4  . Smokeless tobacco: Former Systems developer    Types: Chew  Substance Use Topics  . Alcohol use: No       Family History :     Family History  Problem Relation Age of Onset  . Heart disease Mother        MI  . Heart disease Father   . Heart attack Father 48  . Heart disease Brother   . Cancer Brother        ADRENAL GLAND  . Colon polyps Brother   . Liver cancer Brother 89       deceased , ?metastatic cancer?   . Heart disease Maternal Grandfather        MI  . Cancer Paternal Grandfather        LUNG  . Colon cancer Neg Hx       Home Medications:   Prior to Admission medications   Medication Sig Start Date End Date Taking? Authorizing Provider  acetaminophen (TYLENOL) 500 MG  tablet Take 1,000 mg by mouth 2 (two) times daily as needed for mild pain or moderate pain.    Yes [provider]  Ascorbic Acid (VITAMIN C) 1000 MG tablet Take 1,000-2,000 mg by mouth daily.    Yes [provider]  atenolol (TENORMIN) 100 MG tablet Take 100 mg by mouth every morning.    Yes [provider]  Biotin 5000 MCG TABS Take 1 tablet by mouth daily.   Yes [provider]  Cholecalciferol (VITAMIN D) 125 MCG (5000 UT) CAPS Take 1 capsule by mouth daily.    Yes [provider]  citalopram (CELEXA) 20 MG tablet Take 20 mg by mouth every other day.    Yes [provider]  cyclobenzaprine (FLEXERIL) 10 MG tablet Take 10 mg by mouth 2 (two) times daily as needed for muscle spasms.    Yes [provider]  folic acid (FOLVITE) 093 MCG tablet Take 800 mcg by mouth daily.    Yes [provider]  glimepiride (AMARYL) 1 MG tablet Take 1 mg by mouth daily before breakfast.  04/23/13  Yes [provider]  HYDROcodone-acetaminophen (NORCO/VICODIN) 5-325 MG tablet Take 1 tablet by mouth every 4 (four) hours as needed for moderate pain.  03/10/17  Yes [provider]  levothyroxine (SYNTHROID, LEVOTHROID) 88 MCG tablet Take 88 mcg by mouth daily before breakfast.  01/18/17  Yes [provider]  lisinopril (PRINIVIL,ZESTRIL) 5 MG tablet Take 5 mg by mouth daily.  03/09/13  Yes [provider]  loratadine (CLARITIN) 10 MG tablet Take 10 mg by mouth daily.   Yes [provider]  metFORMIN (GLUCOPHAGE) 1000 MG tablet Take 1,000 mg by mouth 2 (two) times daily with a meal.  12/01/16  Yes [provider]  NONFORMULARY OR COMPOUNDED ITEM Apply 1 application topically 3 (three) times daily as needed (Pain). Diclofenac, Baclofen, Gabapentin, Lidocaine, Menthol - Compounded at [facility]   Yes [provider]  omeprazole (PRILOSEC) 20 MG capsule Take 20 mg by mouth daily.    Yes  [provider]  polyethylene glycol (MIRALAX / GLYCOLAX) packet Take 1 packet by mouth once as needed. For constipation 11/28/18 12/01/18 Yes [provider]  vitamin B-12 (CYANOCOBALAMIN) 1000 MCG tablet Take 1,000 mcg by mouth daily.   Yes [provider]     Allergies:     Allergies  Allergen Reactions  . Celecoxib Swelling  . Codeine Other (See Comments)    "makes me feel bad"  . Dilaudid [Hydromorphone Hcl] Other (See Comments)    hallucination  . Morphine Nausea And Vomiting  . Statins     Muscle pain  . Tetracycline Nausea And Vomiting     Physical Exam:   Vitals  Blood pressure 124/70, pulse 72, resp. rate 16, height 5' 5"  (1.651 m), weight 81.6 kg, SpO2 100 %.  1.  General: Appears in no acute distress  2. Psychiatric:  Intact judgement and  insight, awake alert, oriented x 3.  3. Neurologic: No focal neurological deficits, all cranial nerves intact.Strength 5/5 all 4 extremities, sensation intact all 4 extremities, plantars down going.  4. Eyes :  anicteric sclerae, moist conjunctivae with no lid lag. PERRLA.  5. ENMT:  Oropharynx clear with moist mucous membranes and good dentition  6. Neck:  supple, no cervical lymphadenopathy appriciated, No thyromegaly  7. Respiratory : Normal respiratory effort, good air movement bilaterally,clear to  auscultation bilaterally  8. Cardiovascular : RRR, no gallops, rubs or murmurs, no leg edema  9. Gastrointestinal:  Abdomen is soft, generalized tenderness to palpation.  No organomegaly  10. Skin:  No cyanosis, normal texture and turgor, no rash, lesions or ulcers  11.Musculoskeletal:  Good muscle tone,  joints appear normal , no effusions,  normal range of motion    Data Review:    CBC Recent Labs  Lab 11/28/18 2002  WBC 19.9*  HGB 16.0*  HCT 51.8*  PLT 300  MCV 85.9  MCH 26.5  MCHC 30.9  RDW 15.4  LYMPHSABS 1.5  MONOABS 0.4  EOSABS 0.2  BASOSABS 0.2*    ------------------------------------------------------------------------------------------------------------------  Results for orders placed or performed during the hospital encounter of 11/28/18 (from the past 48 hour(s))  I-stat troponin, ED     Status: None   Collection Time: 11/28/18  5:54 PM  Result Value Ref Range   Troponin i, poc 0.01 0.00 - 0.08 ng/mL   Comment 3            Comment: Due to the release kinetics of cTnI, a negative result within the first hours of the onset of symptoms does not rule out myocardial infarction with certainty. If myocardial infarction is still suspected, repeat the test at appropriate intervals.   I-Stat CG4 Lactic Acid, ED     Status: Abnormal   Collection Time: 11/28/18  5:56 PM  Result Value Ref Range   Lactic Acid, Venous 3.36 (HH) 0.5 - 1.9 mmol/L  CBG monitoring, ED     Status: Abnormal   Collection Time: 11/28/18  6:08 PM  Result Value Ref Range   Glucose-Capillary 189 (H) 70 - 99 mg/dL  C difficile quick scan w PCR reflex     Status: None   Collection Time: 11/28/18  7:15 PM  Result Value Ref Range   C Diff antigen NEGATIVE NEGATIVE   C Diff toxin NEGATIVE NEGATIVE   C Diff interpretation No C. difficile detected.     Comment: Performed at [facility], [address], [place], [place] [address]  Comprehensive metabolic panel     Status: Abnormal   Collection Time: 11/28/18  8:02 PM  Result Value Ref Range   Sodium 121 (L) 135 - 145 mmol/L  Potassium 4.4 3.5 - 5.1 mmol/L   Chloride 94 (L) 98 - 111 mmol/L   CO2 14 (L) 22 - 32 mmol/L   Glucose, Bld 134 (H) 70 - 99 mg/dL   BUN 15 8 - 23 mg/dL   Creatinine, Ser 0.97 0.44 - 1.00 mg/dL   Calcium 8.2 (L) 8.9 - 10.3 mg/dL   Total Protein 6.8 6.5 - 8.1 g/dL   Albumin 3.4 (L) 3.5 - 5.0 g/dL   AST 32 15 - 41 U/L   ALT 16 0 - 44 U/L   Alkaline Phosphatase 357 (H) 38 - 126 U/L   Total Bilirubin 0.7 0.3 - 1.2 mg/dL   GFR calc non Af Amer 59 (L) >60 mL/min   GFR calc Af Amer >60 >60  mL/min   Anion gap 13 5 - 15    Comment: Performed at [facility], [address], [place], [place] [address]  Lipase, blood     Status: Abnormal   Collection Time: 11/28/18  8:02 PM  Result Value Ref Range   Lipase 68 (H) 11 - 51 U/L    Comment: Performed at [facility], [address], [place], [place] [address]  CBC with Differential     Status: Abnormal   Collection Time: 11/28/18  8:02 PM  Result Value Ref Range   WBC 19.9 (H) 4.0 - 10.5 K/uL   RBC 6.03 (H) 3.87 - 5.11 MIL/uL   Hemoglobin 16.0 (H) 12.0 - 15.0 g/dL   HCT 51.8 (H) 36.0 - 46.0 %   MCV 85.9 80.0 - 100.0 fL   MCH 26.5 26.0 - 34.0 pg   MCHC 30.9 30.0 - 36.0 g/dL   RDW 15.4 11.5 - 15.5 %   Platelets 300 150 - 400 K/uL    Comment: SPECIMEN CHECKED FOR CLOTS   nRBC 0.0 0.0 - 0.2 %   Neutrophils Relative % 88 %   Neutro Abs 17.5 (H) 1.7 - 7.7 K/uL   Lymphocytes Relative 7 %   Lymphs Abs 1.5 0.7 - 4.0 K/uL   Monocytes Relative 2 %   Monocytes Absolute 0.4 0.1 - 1.0 K/uL   Eosinophils Relative 1 %   Eosinophils Absolute 0.2 0.0 - 0.5 K/uL   Basophils Relative 1 %   Basophils Absolute 0.2 (H) 0.0 - 0.1 K/uL   Immature Granulocytes 1 %   Abs Immature Granulocytes 0.13 (H) 0.00 - 0.07 K/uL    Comment: Performed at [facility], [address], [place], [place] [address]  Ethanol     Status: None   Collection Time: 11/28/18  8:02 PM  Result Value Ref Range   Alcohol, Ethyl (B) <10 <10 mg/dL    Comment: Performed at [facility], [address], [place], [place] [address]  Urinalysis, Routine w reflex microscopic     Status: None   Collection Time: 11/28/18 10:23 PM  Result Value Ref Range   Color, Urine YELLOW YELLOW   APPearance CLEAR CLEAR   Specific Gravity, Urine 1.011 1.005 - 1.030   pH 6.0 5.0 - 8.0   Glucose, UA NEGATIVE NEGATIVE mg/dL   Hgb urine dipstick NEGATIVE NEGATIVE   Bilirubin Urine NEGATIVE NEGATIVE   Ketones, ur NEGATIVE NEGATIVE mg/dL   Protein, ur NEGATIVE NEGATIVE mg/dL   Nitrite NEGATIVE  NEGATIVE   Leukocytes, UA NEGATIVE NEGATIVE    Comment: Performed at [facility], [address], [place], [place] [address]  Urine rapid drug screen (hosp performed)     Status: Abnormal   Collection  Time: 11/28/18 10:23 PM  Result Value Ref Range   Opiates POSITIVE (A) NONE DETECTED   Cocaine NONE DETECTED NONE DETECTED   Benzodiazepines POSITIVE (A) NONE DETECTED   Amphetamines NONE DETECTED NONE DETECTED   Tetrahydrocannabinol NONE DETECTED NONE DETECTED   Barbiturates NONE DETECTED NONE DETECTED    Comment: (NOTE) DRUG SCREEN FOR MEDICAL PURPOSES ONLY.  IF CONFIRMATION IS NEEDED FOR ANY PURPOSE, NOTIFY LAB WITHIN 5 DAYS. LOWEST DETECTABLE LIMITS FOR URINE DRUG SCREEN Drug Class                     Cutoff (ng/mL) Amphetamine and metabolites    1000 Barbiturate and metabolites    200 Benzodiazepine                 474 Tricyclics and metabolites     300 Opiates and metabolites        300 Cocaine and metabolites        300 THC                            50 Performed at [facility], [address] [place], [place] [address]     Chemistries  Recent Labs  Lab 11/28/18 2002  NA 121*  K 4.4  CL 94*  CO2 14*  GLUCOSE 134*  BUN 15  CREATININE 0.97  CALCIUM 8.2*  AST 32  ALT 16  ALKPHOS 357*  BILITOT 0.7   ------------------------------------------------------------------------------------------------------------------  ------------------------------------------------------------------------------------------------------------------ GFR: Estimated Creatinine Clearance: 56.1 mL/min (by C-G formula based on SCr of 0.97 mg/dL). Liver Function Tests: Recent Labs  Lab 11/28/18 2002  AST 32  ALT 16  ALKPHOS 357*  BILITOT 0.7  PROT 6.8  ALBUMIN 3.4*   Recent Labs  Lab 11/28/18 2002  LIPASE 68*   No results for input(s): AMMONIA in the last 168 hours. Coagulation Profile: No results for input(s): INR, PROTIME in the last 168 hours. Cardiac Enzymes: No  results for input(s): CKTOTAL, CKMB, CKMBINDEX, TROPONINI in the last 168 hours. BNP (last 3 results) No results for input(s): PROBNP in the last 8760 hours. HbA1C: No results for input(s): HGBA1C in the last 72 hours. CBG: Recent Labs  Lab 11/28/18 1808  GLUCAP 189*   Lipid Profile: No results for input(s): CHOL, HDL, LDLCALC, TRIG, CHOLHDL, LDLDIRECT in the last 72 hours. Thyroid Function Tests: No results for input(s): TSH, T4TOTAL, FREET4, T3FREE, THYROIDAB in the last 72 hours. Anemia Panel: No results for input(s): VITAMINB12, FOLATE, FERRITIN, TIBC, IRON, RETICCTPCT in the last 72 hours.  --------------------------------------------------------------------------------------------------------------- Urine analysis:    Component Value Date/Time   COLORURINE YELLOW 11/28/2018 2223   APPEARANCEUR CLEAR 11/28/2018 2223   LABSPEC 1.011 11/28/2018 2223   PHURINE 6.0 11/28/2018 2223   GLUCOSEU NEGATIVE 11/28/2018 2223   HGBUR NEGATIVE 11/28/2018 2223   BILIRUBINUR NEGATIVE 11/28/2018 2223   KETONESUR NEGATIVE 11/28/2018 2223   PROTEINUR NEGATIVE 11/28/2018 2223   UROBILINOGEN 0.2 05/27/2010 0005   NITRITE NEGATIVE 11/28/2018 2223   LEUKOCYTESUR NEGATIVE 11/28/2018 2223      Imaging Results:    Ct Head Wo Contrast  Result Date: 11/28/2018 CLINICAL DATA:  Altered mental status EXAM: CT HEAD WITHOUT CONTRAST TECHNIQUE: Contiguous axial images were obtained from the base of the skull through the vertex without intravenous contrast. COMPARISON:  Head CT 03/01/2017 FINDINGS: Brain: No acute territorial infarction, hemorrhage, or intracranial mass is visualized. The ventricles are nonenlarged. Vascular: No hyperdense vessels.  Carotid vascular  calcification Skull: Normal. Negative for fracture or focal lesion. Sinuses/Orbits: Mucosal thickening in the sphenoid sinus on the right side. Small debris in the posterior left nasal passage. Other: None IMPRESSION: Negative.  No CT evidence  for acute intracranial abnormality. Electronically Signed   By: Donavan Foil M.D.   On: 11/28/2018 18:15   Ct Cervical Spine Wo Contrast  Result Date: 11/28/2018 CLINICAL DATA:  Initial evaluation for recent cervical spine fusion, pain. EXAM: CT CERVICAL SPINE WITHOUT CONTRAST TECHNIQUE: Multidetector CT imaging of the cervical spine was performed without intravenous contrast. Multiplanar CT image reconstructions were also generated. COMPARISON:  Prior MRI from 09/13/2018. FINDINGS: Alignment: Straightening with slight reversal of the normal cervical lordosis, apex at C4-5. Trace anterolisthesis of C3 on C4, and C5 on C6, most likely chronic and degenerative. Skull base and vertebrae: Skull base intact. Mild rotation of C1 on C2 likely positional. Normal C1-2 articulations otherwise preserved. Dens intact. Vertebral body heights maintained. No acute fracture. Soft tissues and spinal canal: Prevertebral edema with layering effusion and soft tissue emphysema, most likely postoperative in nature related to recent C3-4 ACDF. No frank loculated collections identified. Additional soft tissue stranding seen along the left-sided neck approach. No complication or adverse features. Spinal canal within normal limits. Vascular calcifications noted about the carotid bifurcations. Disc levels: Recent ACDF at C3-4. Hardware appears intact and well position without complication small amount of postoperative gas present within the C3-4 interspace. Additional moderate cervical spondylolysis at C5-6 and C6-7. Multilevel facet arthrosis, most notable at C3-4 on the left and C5-6 on the right. Upper chest: Loculated collection partially visualized about the right shoulder (series 4, image 86), indeterminate, not well assessed on this examination. Visualized upper chest demonstrates no other acute finding. Mild atelectatic changes present at the left lung apex. Visualized lungs are otherwise clear. Other: None. IMPRESSION: 1. No  acute abnormality within the cervical spine. 2. Postoperative changes from recent ACDF at F1-6 without complication. Prevertebral swelling and effusion with soft tissue emphysema felt to be consistent with normal expected postoperative changes. 3. Additional moderate degenerative spondylolysis at C5-6 and C6-7, with multilevel facet arthrosis as above. 4. Partially visualized loculated hypodense collection about the right shoulder as above, indeterminate. Further evaluation with dedicated plain film radiography and/or cross-sectional imaging suggested as clinically warranted. Electronically Signed   By: Jeannine Boga M.D.   On: 11/28/2018 23:09   Ct Abdomen Pelvis W Contrast  Result Date: 11/28/2018 CLINICAL DATA:  Acute abdominal pain. Patient reports spinal fusion at [facility] two days ago. EXAM: CT ABDOMEN AND PELVIS WITH CONTRAST TECHNIQUE: Multidetector CT imaging of the abdomen and pelvis was performed using the standard protocol following bolus administration of intravenous contrast. CONTRAST:  166m ISOVUE-300 IOPAMIDOL (ISOVUE-300) INJECTION 61% COMPARISON:  Abdominal CTA 06/08/2018 FINDINGS: Lower chest: Mild hypoventilatory atelectasis. No pleural fluid or confluent airspace disease. There are coronary artery calcifications. Hepatobiliary: Mild hepatic steatosis without focal hepatic abnormality. Gallbladder distention without calcified gallstone, pericholecystic inflammation, or biliary dilatation. Pancreas: No ductal dilatation or inflammation. Spleen: Normal in size without focal abnormality. Adrenals/Urinary Tract: Normal adrenal glands. No hydronephrosis or perinephric edema. Homogeneous renal enhancement with symmetric excretion on delayed phase imaging. Urinary bladder is distended without wall thickening. Stomach/Bowel: Colonic wall thickening with pericolonic edema extending from the mid transverse colon through the rectum. Liquid stool throughout the entire colon. No pneumatosis or  perforation. Colonic diverticulosis without focal Peri diverticular inflammation. Appendix is not confidently visualized. No small bowel dilatation, obstruction, or inflammatory change. Stomach is  unremarkable. Vascular/Lymphatic: Aortic and branch atherosclerosis. Calcifications at the origin of the celiac and SMA arteries. No evidence of acute vascular abnormality. Portal vein is patent. Reproductive: Status post hysterectomy. No adnexal masses. Other: Small volume of free fluid in the pelvis, pericolic gutters, and upper abdomen. No free air. No loculated fluid or intra-abdominal abscess. Tiny fat containing umbilical hernia. Musculoskeletal: Scoliosis and multilevel degenerative change throughout spine. There are no acute or suspicious osseous abnormalities. IMPRESSION: 1. Colitis extending from the mid transverse colon through the rectum, likely infectious or inflammatory. Consider C diff. The distribution is not consistent with ischemic colitis. 2. Colonic diverticulosis without evidence of focal diverticulitis. 3. Mild hepatic steatosis. 4.  Aortic Atherosclerosis (ICD10-I70.0). Electronically Signed   By: Keith Rake M.D.   On: 11/28/2018 22:29   Dg Chest Portable 1 View  Result Date: 11/28/2018 CLINICAL DATA:  Altered mental status EXAM: PORTABLE CHEST 1 VIEW COMPARISON:  02/28/2017 FINDINGS: Hypoventilation with decreased lung volume and mild bibasilar atelectasis. Negative for heart failure or effusion. Negative for pneumonia. IMPRESSION: Hypoventilation with bibasilar atelectasis. Electronically Signed   By: Franchot Gallo M.D.   On: 11/28/2018 18:29    My personal review of EKG: Rhythm NSR   Assessment & Plan:    Active Problems:   Colitis   1. Colitis-patient presenting with colitis seen on CT scan extending from mid transverse colon through rectum.  Infectious versus inflammatory.  C. difficile PCR negative.  Will start Cipro 400 mg IV every 12 hours, Flagyl 500 mg IV every 8  hours.  2. Status post cervical spine C3-4 surgery at [facility]-stable, no acute changes seen on the CT cervical spine.  3. Hyponatremia-sodium is 121, likely from diarrhea.  Started on IV normal saline.  Will obtain serum osmolality.  Check TSH  4. Abnormal hypodense shadow in right shoulder-seen on CT scan of the cervical spine.  Patient has torn rotator cuff and is followed by orthopedics as outpatient.  Currently she is not complaining of any pain in right shoulder.  She will follow-up with orthopedics as outpatient.  5. Diabetes mellitus type 2-we will hold Amaryl, start sliding scale insulin with NovoLog.  6. Hypothyroidism-continue Synthroid.    DVT Prophylaxis-   Lovenox   AM Labs Ordered, also please review Full Orders  Family Communication: Admission, patients condition and plan of care including tests being ordered have been discussed with the patient  who indicate understanding and agree with the plan and Code Status.  Code Status: DNR  Admission status: Inpatient: Based on patients clinical presentation and evaluation of above clinical data, I have made determination that patient meets Inpatient criteria at this time.  Patient will need more than 2 midnight stay in the hospital for colitis.  Started on IV antibiotics.  Time spent in minutes : 60 minutes   Oswald Hillock M.D on 11/29/2018 at 3:50 AM  Between 7am to 7pm - Pager - [phone]. After 7pm go to [email or web address] - password [facility]   [facility] - Office  [phone]

## 2018-11-29 NOTE — Progress Notes (Signed)
Patient saying things that do not make sense, son says this is new for patient. Patient is incoherent.

## 2018-11-30 DIAGNOSIS — E119 Type 2 diabetes mellitus without complications: Secondary | ICD-10-CM

## 2018-11-30 DIAGNOSIS — K529 Noninfective gastroenteritis and colitis, unspecified: Secondary | ICD-10-CM

## 2018-11-30 DIAGNOSIS — E039 Hypothyroidism, unspecified: Secondary | ICD-10-CM

## 2018-11-30 DIAGNOSIS — K921 Melena: Secondary | ICD-10-CM

## 2018-11-30 LAB — CBC WITH DIFFERENTIAL/PLATELET
ABS IMMATURE GRANULOCYTES: 0.05 10*3/uL (ref 0.00–0.07)
Basophils Absolute: 0.1 10*3/uL (ref 0.0–0.1)
Basophils Relative: 1 %
Eosinophils Absolute: 0.2 10*3/uL (ref 0.0–0.5)
Eosinophils Relative: 2 %
HCT: 30.8 % — ABNORMAL LOW (ref 36.0–46.0)
Hemoglobin: 9.9 g/dL — ABNORMAL LOW (ref 12.0–15.0)
Immature Granulocytes: 0 %
Lymphocytes Relative: 9 %
Lymphs Abs: 1 10*3/uL (ref 0.7–4.0)
MCH: 26.8 pg (ref 26.0–34.0)
MCHC: 32.1 g/dL (ref 30.0–36.0)
MCV: 83.5 fL (ref 80.0–100.0)
Monocytes Absolute: 0.7 10*3/uL (ref 0.1–1.0)
Monocytes Relative: 5 %
NEUTROS ABS: 10.1 10*3/uL — AB (ref 1.7–7.7)
NEUTROS PCT: 83 %
Platelets: 256 10*3/uL (ref 150–400)
RBC: 3.69 MIL/uL — AB (ref 3.87–5.11)
RDW: 15.2 % (ref 11.5–15.5)
WBC: 12.1 10*3/uL — ABNORMAL HIGH (ref 4.0–10.5)
nRBC: 0 % (ref 0.0–0.2)

## 2018-11-30 LAB — BASIC METABOLIC PANEL
ANION GAP: 6 (ref 5–15)
BUN: 7 mg/dL — ABNORMAL LOW (ref 8–23)
CO2: 20 mmol/L — ABNORMAL LOW (ref 22–32)
Calcium: 7.6 mg/dL — ABNORMAL LOW (ref 8.9–10.3)
Chloride: 102 mmol/L (ref 98–111)
Creatinine, Ser: 0.69 mg/dL (ref 0.44–1.00)
GFR calc Af Amer: >60 mL/min (ref >60)
GFR calc non Af Amer: >60 mL/min (ref >60)
Glucose, Bld: 117 mg/dL — ABNORMAL HIGH (ref 70–99)
Potassium: 3.4 mmol/L — ABNORMAL LOW (ref 3.5–5.1)
Sodium: 128 mmol/L — ABNORMAL LOW (ref 135–145)

## 2018-11-30 LAB — GASTROINTESTINAL PANEL BY PCR, STOOL (REPLACES STOOL CULTURE)

## 2018-11-30 LAB — URINE CULTURE
Culture: NO GROWTH
Special Requests: NORMAL

## 2018-11-30 LAB — LACTIC ACID, PLASMA: LACTIC ACID, VENOUS: 1.1 mmol/L (ref 0.5–1.9)

## 2018-11-30 MED ORDER — POLYETHYLENE GLYCOL 3350 17 G PO PACK: 17.0000 g | PACK | Freq: Every day | ORAL | Status: DC | PRN

## 2018-11-30 MED ORDER — SACCHAROMYCES BOULARDII 250 MG PO CAPS
250.0000 mg | ORAL_CAPSULE | Freq: Two times a day (BID) | ORAL | Status: DC
  Administered 2018-11-30 – 2018-12-02 (×5): 250 mg via ORAL
  Filled 2018-11-30 (×5): qty 1

## 2018-11-30 MED ORDER — TRAMADOL HCL 50 MG PO TABS: 50.0000 mg | ORAL_TABLET | Freq: Two times a day (BID) | ORAL | Status: DC | PRN

## 2018-11-30 MED ORDER — POTASSIUM CHLORIDE 10 MEQ/100ML IV SOLN
10.0000 meq | INTRAVENOUS | Status: AC
  Administered 2018-11-30 (×2): 10 meq via INTRAVENOUS
  Filled 2018-11-30 (×4): qty 100

## 2018-11-30 MED ORDER — SENNOSIDES-DOCUSATE SODIUM 8.6-50 MG PO TABS: 2.0000 | ORAL_TABLET | Freq: Every evening | ORAL | Status: DC | PRN

## 2018-11-30 MED ORDER — POTASSIUM CHLORIDE CRYS ER 20 MEQ PO TBCR
20.0000 meq | EXTENDED_RELEASE_TABLET | Freq: Once | ORAL | Status: AC
  Administered 2018-11-30: 20 meq via ORAL
  Filled 2018-11-30: qty 1

## 2018-11-30 MED ORDER — PANTOPRAZOLE SODIUM 40 MG PO TBEC
40.0000 mg | DELAYED_RELEASE_TABLET | Freq: Two times a day (BID) | ORAL | Status: DC
  Administered 2018-11-30 – 2018-12-02 (×5): 40 mg via ORAL
  Filled 2018-11-30 (×5): qty 1

## 2018-11-30 MED ORDER — OXYCODONE HCL 5 MG PO TABS: 5.0000 mg | ORAL_TABLET | Freq: Four times a day (QID) | ORAL | Status: DC | PRN

## 2018-11-30 NOTE — Progress Notes (Signed)
Subjective: Today she states her abdominal pain is a bit better than yesterday. Had bloody bowel movement this morning (measures about 75 cc in the commode hat). Some weakness/fatigue but no chest pain or dyspnea. No N/V. No other GI complaints.  Patient's son concerned she was ordered NSAIDs for pain given GI bleed and is asking for something else for her neck pain (given neck surgery this week).  Objective: Vital signs in last 24 hours: Temp:  [97.7 F (36.5 C)-98.3 F (36.8 C)] 97.8 F (36.6 C) (01/10 0518) Pulse Rate:  [72-76] 72 (01/10 0518) Resp:  [19-20] 19 (01/10 0518) BP: (87-128)/(57-72) 128/72 (01/10 0518) SpO2:  [93 %-98 %] 95 % (01/10 0518) Last BM Date: 11/28/18 General:   Alert and oriented, pleasant Head:  Normocephalic and atraumatic. Eyes:  No icterus, sclera clear. Conjuctiva pink.  Heart:  S1, S2 present, no murmurs noted.  Lungs: Clear to auscultation bilaterally, without wheezing, rales, or rhonchi.  Abdomen:  Bowel sounds present, soft, non-distended. Mild to moderate abdominal TTP. No HSM or hernias noted. No rebound or guarding. No masses appreciated  Msk:  Symmetrical without gross deformities. Pulses:  Normal bilateral DP pulses noted. Extremities:  Without clubbing or edema. Neurologic:  Alert and  oriented x4;  grossly normal neurologically. Skin:  Warm and dry, intact without significant lesions.  Psych:  Alert and cooperative. Normal mood and affect.  Intake/Output from previous day: 01/09 0701 - 01/10 0700 In: 1308.3 [I.V.:611.6; IV Piggyback:696.8] Out: -  Intake/Output this shift: No intake/output data recorded.  Lab Results: Recent Labs    11/28/18 2002 11/29/18 0514 11/29/18 1558 11/30/18 0548  WBC 19.9* 19.9*  --  12.1*  HGB 16.0* 13.1 11.5* 9.9*  HCT 51.8* 42.5 35.9* 30.8*  PLT 300 343  --  256   BMET Recent Labs    11/28/18 2002 11/29/18 0514 11/30/18 0548  NA 121* 126* 128*  K 4.4 4.1 3.4*  CL 94* 97* 102  CO2 14*  18* 20*  GLUCOSE 134* 162* 117*  BUN 15 15 7*  CREATININE 0.97 0.93 0.69  CALCIUM 8.2* 7.7* 7.6*   LFT Recent Labs    11/28/18 2002 11/29/18 0514  PROT 6.8 5.8*  ALBUMIN 3.4* 2.9*  AST 32 27  ALT 16 15  ALKPHOS 357* 261*  BILITOT 0.7 0.6   PT/INR No results for input(s): LABPROT, INR in the last 72 hours. Hepatitis Panel No results for input(s): HEPBSAG, HCVAB, HEPAIGM, HEPBIGM in the last 72 hours.   Studies/Results: Ct Head Wo Contrast  Result Date: 11/28/2018 CLINICAL DATA:  Altered mental status EXAM: CT HEAD WITHOUT CONTRAST TECHNIQUE: Contiguous axial images were obtained from the base of the skull through the vertex without intravenous contrast. COMPARISON:  Head CT 03/01/2017 FINDINGS: Brain: No acute territorial infarction, hemorrhage, or intracranial mass is visualized. The ventricles are nonenlarged. Vascular: No hyperdense vessels.  Carotid vascular calcification Skull: Normal. Negative for fracture or focal lesion. Sinuses/Orbits: Mucosal thickening in the sphenoid sinus on the right side. Small debris in the posterior left nasal passage. Other: None IMPRESSION: Negative.  No CT evidence for acute intracranial abnormality. Electronically Signed   By: Donavan Foil M.D.   On: 11/28/2018 18:15   Ct Cervical Spine Wo Contrast  Result Date: 11/28/2018 CLINICAL DATA:  Initial evaluation for recent cervical spine fusion, pain. EXAM: CT CERVICAL SPINE WITHOUT CONTRAST TECHNIQUE: Multidetector CT imaging of the cervical spine was performed without intravenous contrast. Multiplanar CT image reconstructions were also generated. COMPARISON:  Prior MRI from 09/13/2018. FINDINGS: Alignment: Straightening with slight reversal of the normal cervical lordosis, apex at C4-5. Trace anterolisthesis of C3 on C4, and C5 on C6, most likely chronic and degenerative. Skull base and vertebrae: Skull base intact. Mild rotation of C1 on C2 likely positional. Normal C1-2 articulations otherwise  preserved. Dens intact. Vertebral body heights maintained. No acute fracture. Soft tissues and spinal canal: Prevertebral edema with layering effusion and soft tissue emphysema, most likely postoperative in nature related to recent C3-4 ACDF. No frank loculated collections identified. Additional soft tissue stranding seen along the left-sided neck approach. No complication or adverse features. Spinal canal within normal limits. Vascular calcifications noted about the carotid bifurcations. Disc levels: Recent ACDF at C3-4. Hardware appears intact and well position without complication small amount of postoperative gas present within the C3-4 interspace. Additional moderate cervical spondylolysis at C5-6 and C6-7. Multilevel facet arthrosis, most notable at C3-4 on the left and C5-6 on the right. Upper chest: Loculated collection partially visualized about the right shoulder (series 4, image 86), indeterminate, not well assessed on this examination. Visualized upper chest demonstrates no other acute finding. Mild atelectatic changes present at the left lung apex. Visualized lungs are otherwise clear. Other: None. IMPRESSION: 1. No acute abnormality within the cervical spine. 2. Postoperative changes from recent ACDF at C7-8 without complication. Prevertebral swelling and effusion with soft tissue emphysema felt to be consistent with normal expected postoperative changes. 3. Additional moderate degenerative spondylolysis at C5-6 and C6-7, with multilevel facet arthrosis as above. 4. Partially visualized loculated hypodense collection about the right shoulder as above, indeterminate. Further evaluation with dedicated plain film radiography and/or cross-sectional imaging suggested as clinically warranted. Electronically Signed   By: Jeannine Boga M.D.   On: 11/28/2018 23:09   Ct Abdomen Pelvis W Contrast  Result Date: 11/28/2018 CLINICAL DATA:  Acute abdominal pain. Patient reports spinal fusion at [facility] two  days ago. EXAM: CT ABDOMEN AND PELVIS WITH CONTRAST TECHNIQUE: Multidetector CT imaging of the abdomen and pelvis was performed using the standard protocol following bolus administration of intravenous contrast. CONTRAST:  133m ISOVUE-300 IOPAMIDOL (ISOVUE-300) INJECTION 61% COMPARISON:  Abdominal CTA 06/08/2018 FINDINGS: Lower chest: Mild hypoventilatory atelectasis. No pleural fluid or confluent airspace disease. There are coronary artery calcifications. Hepatobiliary: Mild hepatic steatosis without focal hepatic abnormality. Gallbladder distention without calcified gallstone, pericholecystic inflammation, or biliary dilatation. Pancreas: No ductal dilatation or inflammation. Spleen: Normal in size without focal abnormality. Adrenals/Urinary Tract: Normal adrenal glands. No hydronephrosis or perinephric edema. Homogeneous renal enhancement with symmetric excretion on delayed phase imaging. Urinary bladder is distended without wall thickening. Stomach/Bowel: Colonic wall thickening with pericolonic edema extending from the mid transverse colon through the rectum. Liquid stool throughout the entire colon. No pneumatosis or perforation. Colonic diverticulosis without focal Peri diverticular inflammation. Appendix is not confidently visualized. No small bowel dilatation, obstruction, or inflammatory change. Stomach is unremarkable. Vascular/Lymphatic: Aortic and branch atherosclerosis. Calcifications at the origin of the celiac and SMA arteries. No evidence of acute vascular abnormality. Portal vein is patent. Reproductive: Status post hysterectomy. No adnexal masses. Other: Small volume of free fluid in the pelvis, pericolic gutters, and upper abdomen. No free air. No loculated fluid or intra-abdominal abscess. Tiny fat containing umbilical hernia. Musculoskeletal: Scoliosis and multilevel degenerative change throughout spine. There are no acute or suspicious osseous abnormalities. IMPRESSION: 1. Colitis extending  from the mid transverse colon through the rectum, likely infectious or inflammatory. Consider C diff. The distribution is not consistent with ischemic colitis.  2. Colonic diverticulosis without evidence of focal diverticulitis. 3. Mild hepatic steatosis. 4.  Aortic Atherosclerosis (ICD10-I70.0). Electronically Signed   By: Keith Rake M.D.   On: 11/28/2018 22:29   Dg Chest Portable 1 View  Result Date: 11/28/2018 CLINICAL DATA:  Altered mental status EXAM: PORTABLE CHEST 1 VIEW COMPARISON:  02/28/2017 FINDINGS: Hypoventilation with decreased lung volume and mild bibasilar atelectasis. Negative for heart failure or effusion. Negative for pneumonia. IMPRESSION: Hypoventilation with bibasilar atelectasis. Electronically Signed   By: Franchot Gallo M.D.   On: 11/28/2018 18:29    Assessment: 73 year old female with abdominal cramping and bloody bowel movements felt to be most likely ischemic colitis in the setting of episode of hypotension (chief admitting issue) 2 days s/p cervical spine surgery at [facility]. Her hgb was initially mildly elevated at 16.0. She had multiple bloody bowel movements the first day; this seems to have slowed per nursing notes to 200cc blood over the course of the 12 hour night shift. Her hgb has declined to 13.1 -> 11.5 -> 9.9; declines blood transfusion per religious belief. Her lactic acidosis has improved today (3.3 -> 1.1), leucocytosis improved (19.9 -> 12.1) and hypotension seems resolving. Her sodium is also improved (128) and kidney function stable with Cr 0.69.  Clinically she seems to be improving today with some improvement noted in her abdominal discomfort. Given her improvement in WBC count, lactic acid, blood pressure this is reassurring that we may be nearing resolution.  Plan: 1. Would avoid all NSAIDs at this time with active GI bleed 2. Consider alternative pain medication for recent post-op surgical pain - will defer to hospitalist 3. Monitor H/H. Will recheck  this afternoon and in the am 4. Supportive measures 5. Will plan for outpatient GI follow-up with CTA to evaluate baseline vasculature when she is over her acute illness   Thank you for allowing [place] to participate in the care of [place], DNP, AGNP-C Adult & Gerontological Nurse Practitioner [facility]     LOS: 1 day    11/30/2018, 8:12 AM

## 2018-11-30 NOTE — Progress Notes (Signed)
PROGRESS NOTE    Brittany Goodman  GXQ:[id number] DOB: May 25, 1946 DOA: 11/28/2018 PCP: [name], MD   Brief Narrative:  73 year old with past medical history of diabetes mellitus type 2, hyperlipidemia, essential hypertension, hypothyroidism, CVA, recent cervical spinal surgery came to the hospital complains of generalized weakness, abdominal cramping and diarrhea.  Patient recently underwent spinal surgery at [facility].  CT of the abdomen pelvis here showed colitis extending from mid transverse colon to rectum.  C. difficile was negative.  Started on Cipro and Flagyl.  Initially also noted to be hypo-natremia which has been improving with IV fluids.  Gastroenterology team consulted.   Assessment & Plan:   Active Problems:   Colitis   Altered mental status   Diarrhea of presumed infectious origin   Hyponatremia   Hypotension   Hematochezia   Diverticulosis of colon with hemorrhage  Transverse and descending colon colitis Lower GI bleed - C. difficile is negative, GI panel pending.  Ischemia/inflammatory versus infectious - Diet as tolerated, pain control.  Avoid NSAIDs.  Hemodynamically stable at this time -Gastroenterology following. -Continue Cipro and Flagyl.  Florastor added - Antiemetics PRN.  Add PPI twice daily  Status post cervical spine surgery C3-4 at [facility] -Currently stable.  Pain control-avoid NSAIDs -We will add narcotics if necessary with bowel regimen.  Hyponatremia, likely due to dehydration -This is improving with gentle hydration.  Trended up to 128.  We will continue to monitor this daily.  Diabetes mellitus type 2 - Insulin sliding scale and Accu-Chek  Hypothyroidism -Continue Synthroid -TSH is within normal limits  DVT prophylaxis: SCDs Code Status: Full code Family Communication: Sister at bedside Disposition Plan: To be determined  Consultants:   Gastroenterology  Procedures:   None  Antimicrobials:   Cipro and  Flagyl   Subjective: Patient feels a little better this morning.  Had 1 bowel movement with small amount of blood in it this morning.  Review of Systems Otherwise negative except as per HPI, including: General: Denies fever, chills, night sweats or unintended weight loss. Resp: Denies cough, wheezing, shortness of breath. Cardiac: Denies chest pain, palpitations, orthopnea, paroxysmal nocturnal dyspnea. GI: Denies abdominal pain, nausea, vomiting, diarrhea or constipation GU: Denies dysuria, frequency, hesitancy or incontinence MS: Denies muscle aches, joint pain or swelling Neuro: Denies headache, neurologic deficits (focal weakness, numbness, tingling), abnormal gait Psych: Denies anxiety, depression, SI/HI/AVH Skin: Denies new rashes or lesions ID: Denies sick contacts, exotic exposures, travel  Objective: Vitals:   11/29/18 1450 11/29/18 2029 11/29/18 2135 11/30/18 0518  BP: (!) 117/59  124/61 128/72  Pulse: 76  73 72  Resp: 19  19 19   Temp: 98 F (36.7 C)  98.3 F (36.8 C) 97.8 F (36.6 C)  TempSrc: Oral  Oral Oral  SpO2: 93% 95% 93% 95%  Weight:      Height:        Intake/Output Summary (Last 24 hours) at 11/30/2018 1333 Last data filed at 11/30/2018 1100 Gross per 24 hour  Intake 590.33 ml  Output -  Net 590.33 ml   Filed Weights   11/28/18 1906  Weight: 81.6 kg    Examination:  General exam: Appears calm and comfortable  Respiratory system: Manish breath sounds bilaterally  at the bases Cardiovascular system: S1 & S2 heard, RRR. No JVD, murmurs, rubs, gallops or clicks. No pedal edema. Gastrointestinal system: Abdomen is nondistended, soft and nontender. No organomegaly or masses felt. Normal bowel sounds heard. Central nervous system: Alert and oriented. No focal neurological deficits.  Extremities: Symmetric 5 x 5 power. Skin: No rashes, lesions or ulcers Psychiatry: Judgement and insight appear normal. Mood & affect appropriate.     Data Reviewed:    CBC: Recent Labs  Lab 11/28/18 2002 11/29/18 0514 11/29/18 1558 11/30/18 0548  WBC 19.9* 19.9*  --  12.1*  NEUTROABS 17.5*  --   --  10.1*  HGB 16.0* 13.1 11.5* 9.9*  HCT 51.8* 42.5 35.9* 30.8*  MCV 85.9 85.7  --  83.5  PLT 300 343  --  825   Basic Metabolic Panel: Recent Labs  Lab 11/28/18 2002 11/29/18 0514 11/30/18 0548  NA 121* 126* 128*  K 4.4 4.1 3.4*  CL 94* 97* 102  CO2 14* 18* 20*  GLUCOSE 134* 162* 117*  BUN 15 15 7*  CREATININE 0.97 0.93 0.69  CALCIUM 8.2* 7.7* 7.6*   GFR: Estimated Creatinine Clearance: 68 mL/min (by C-G formula based on SCr of 0.69 mg/dL). Liver Function Tests: Recent Labs  Lab 11/28/18 2002 11/29/18 0514  AST 32 27  ALT 16 15  ALKPHOS 357* 261*  BILITOT 0.7 0.6  PROT 6.8 5.8*  ALBUMIN 3.4* 2.9*   Recent Labs  Lab 11/28/18 2002  LIPASE 68*   No results for input(s): AMMONIA in the last 168 hours. Coagulation Profile: No results for input(s): INR, PROTIME in the last 168 hours. Cardiac Enzymes: No results for input(s): CKTOTAL, CKMB, CKMBINDEX, TROPONINI in the last 168 hours. BNP (last 3 results) No results for input(s): PROBNP in the last 8760 hours. HbA1C: No results for input(s): HGBA1C in the last 72 hours. CBG: Recent Labs  Lab 11/28/18 1808  GLUCAP 189*   Lipid Profile: No results for input(s): CHOL, HDL, LDLCALC, TRIG, CHOLHDL, LDLDIRECT in the last 72 hours. Thyroid Function Tests: Recent Labs    11/29/18 0514  TSH 2.298   Anemia Panel: No results for input(s): VITAMINB12, FOLATE, FERRITIN, TIBC, IRON, RETICCTPCT in the last 72 hours. Sepsis Labs: Recent Labs  Lab 11/28/18 1756 11/29/18 1000 11/30/18 0548  LATICACIDVEN 3.36* 3.4* 1.1    Recent Results (from the past 240 hour(s))  C difficile quick scan w PCR reflex     Status: None   Collection Time: 11/28/18  7:15 PM  Result Value Ref Range Status   C Diff antigen NEGATIVE NEGATIVE Final   C Diff toxin NEGATIVE NEGATIVE Final   C Diff  interpretation No C. difficile detected.  Final    Comment: Performed at [facility], [address], [place], [place] [address]  Blood Culture (routine x 2)     Status: None (Preliminary result)   Collection Time: 11/28/18  8:02 PM  Result Value Ref Range Status   Specimen Description BLOOD RIGHT HAND  Final   Special Requests   Final    BOTTLES DRAWN AEROBIC AND ANAEROBIC Blood Culture adequate volume   Culture   Final    NO GROWTH 2 DAYS Performed at [facility], [address] [place], [place] [address]    Report Status PENDING  Incomplete  Blood Culture (routine x 2)     Status: None (Preliminary result)   Collection Time: 11/28/18  8:04 PM  Result Value Ref Range Status   Specimen Description BLOOD RIGHT ARM  Final   Special Requests AEROBIC BOTTLE ONLY Blood Culture adequate volume  Final   Culture   Final    NO GROWTH 2 DAYS Performed at [facility], [address], [place], [place] [address]    Report Status PENDING  Incomplete  Urine culture     Status: None   Collection Time: 11/28/18 10:23 PM  Result Value Ref Range Status   Specimen Description   Final    URINE, CATHETERIZED Performed at [facility], [address], [place], [place] [address]    Special Requests   Final    Normal Performed at [facility], [address], [place], [place] [address]    Culture   Final    NO GROWTH Performed at [facility], [place] [address] [place], [place] [address]    Report Status 11/30/2018 FINAL  Final         Radiology Studies: Ct Head Wo Contrast  Result Date: 11/28/2018 CLINICAL DATA:  Altered mental status EXAM: CT HEAD WITHOUT CONTRAST TECHNIQUE: Contiguous axial images were obtained from the base of the skull through the vertex without intravenous contrast. COMPARISON:  Head CT 03/01/2017 FINDINGS: Brain: No acute territorial infarction, hemorrhage, or intracranial mass is visualized. The ventricles are nonenlarged. Vascular: No hyperdense vessels.  Carotid  vascular calcification Skull: Normal. Negative for fracture or focal lesion. Sinuses/Orbits: Mucosal thickening in the sphenoid sinus on the right side. Small debris in the posterior left nasal passage. Other: None IMPRESSION: Negative.  No CT evidence for acute intracranial abnormality. Electronically Signed   By: Donavan Foil M.D.   On: 11/28/2018 18:15   Ct Cervical Spine Wo Contrast  Result Date: 11/28/2018 CLINICAL DATA:  Initial evaluation for recent cervical spine fusion, pain. EXAM: CT CERVICAL SPINE WITHOUT CONTRAST TECHNIQUE: Multidetector CT imaging of the cervical spine was performed without intravenous contrast. Multiplanar CT image reconstructions were also generated. COMPARISON:  Prior MRI from 09/13/2018. FINDINGS: Alignment: Straightening with slight reversal of the normal cervical lordosis, apex at C4-5. Trace anterolisthesis of C3 on C4, and C5 on C6, most likely chronic and degenerative. Skull base and vertebrae: Skull base intact. Mild rotation of C1 on C2 likely positional. Normal C1-2 articulations otherwise preserved. Dens intact. Vertebral body heights maintained. No acute fracture. Soft tissues and spinal canal: Prevertebral edema with layering effusion and soft tissue emphysema, most likely postoperative in nature related to recent C3-4 ACDF. No frank loculated collections identified. Additional soft tissue stranding seen along the left-sided neck approach. No complication or adverse features. Spinal canal within normal limits. Vascular calcifications noted about the carotid bifurcations. Disc levels: Recent ACDF at C3-4. Hardware appears intact and well position without complication small amount of postoperative gas present within the C3-4 interspace. Additional moderate cervical spondylolysis at C5-6 and C6-7. Multilevel facet arthrosis, most notable at C3-4 on the left and C5-6 on the right. Upper chest: Loculated collection partially visualized about the right shoulder (series 4,  image 86), indeterminate, not well assessed on this examination. Visualized upper chest demonstrates no other acute finding. Mild atelectatic changes present at the left lung apex. Visualized lungs are otherwise clear. Other: None. IMPRESSION: 1. No acute abnormality within the cervical spine. 2. Postoperative changes from recent ACDF at T2-6 without complication. Prevertebral swelling and effusion with soft tissue emphysema felt to be consistent with normal expected postoperative changes. 3. Additional moderate degenerative spondylolysis at C5-6 and C6-7, with multilevel facet arthrosis as above. 4. Partially visualized loculated hypodense collection about the right shoulder as above, indeterminate. Further evaluation with dedicated plain film radiography and/or cross-sectional imaging suggested as clinically warranted. Electronically Signed   By: Jeannine Boga M.D.   On: 11/28/2018 23:09   Ct Abdomen Pelvis W Contrast  Result Date: 11/28/2018 CLINICAL DATA:  Acute abdominal pain. Patient reports spinal fusion at [facility] two days ago. EXAM: CT ABDOMEN AND PELVIS WITH CONTRAST TECHNIQUE: Multidetector CT imaging of the abdomen and pelvis was performed using the standard protocol following bolus administration of intravenous contrast. CONTRAST:  1101m ISOVUE-300 IOPAMIDOL (ISOVUE-300) INJECTION 61% COMPARISON:  Abdominal CTA 06/08/2018 FINDINGS: Lower chest: Mild hypoventilatory atelectasis. No pleural fluid or confluent airspace disease. There are coronary artery calcifications. Hepatobiliary: Mild hepatic steatosis without focal hepatic abnormality. Gallbladder distention without calcified gallstone, pericholecystic inflammation, or biliary dilatation. Pancreas: No ductal dilatation or inflammation. Spleen: Normal in size without focal abnormality. Adrenals/Urinary Tract: Normal adrenal glands. No hydronephrosis or perinephric edema. Homogeneous renal enhancement with symmetric excretion on delayed phase  imaging. Urinary bladder is distended without wall thickening. Stomach/Bowel: Colonic wall thickening with pericolonic edema extending from the mid transverse colon through the rectum. Liquid stool throughout the entire colon. No pneumatosis or perforation. Colonic diverticulosis without focal Peri diverticular inflammation. Appendix is not confidently visualized. No small bowel dilatation, obstruction, or inflammatory change. Stomach is unremarkable. Vascular/Lymphatic: Aortic and branch atherosclerosis. Calcifications at the origin of the celiac and SMA arteries. No evidence of acute vascular abnormality. Portal vein is patent. Reproductive: Status post hysterectomy. No adnexal masses. Other: Small volume of free fluid in the pelvis, pericolic gutters, and upper abdomen. No free air. No loculated fluid or intra-abdominal abscess. Tiny fat containing umbilical hernia. Musculoskeletal: Scoliosis and multilevel degenerative change throughout spine. There are no acute or suspicious osseous abnormalities. IMPRESSION: 1. Colitis extending from the mid transverse colon through the rectum, likely infectious or inflammatory. Consider C diff. The distribution is not consistent with ischemic colitis. 2. Colonic diverticulosis without evidence of focal diverticulitis. 3. Mild hepatic steatosis. 4.  Aortic Atherosclerosis (ICD10-I70.0). Electronically Signed   By: MKeith RakeM.D.   On: 11/28/2018 22:29   Dg Chest Portable 1 View  Result Date: 11/28/2018 CLINICAL DATA:  Altered mental status EXAM: PORTABLE CHEST 1 VIEW COMPARISON:  02/28/2017 FINDINGS: Hypoventilation with decreased lung volume and mild bibasilar atelectasis. Negative for heart failure or effusion. Negative for pneumonia. IMPRESSION: Hypoventilation with bibasilar atelectasis. Electronically Signed   By: CFranchot GalloM.D.   On: 11/28/2018 18:29        Scheduled Meds: . atenolol  100 mg Oral q morning - 10a  . citalopram  20 mg Oral QODAY  .  folic acid  17,408mcg Oral Daily  . levothyroxine  88 mcg Oral Q0600  . loratadine  10 mg Oral Daily  . potassium chloride  20 mEq Oral Once  . saccharomyces boulardii  250 mg Oral BID  . vitamin B-12  1,000 mcg Oral Daily   Continuous Infusions: . sodium chloride 100 mL/hr at 11/30/18 0658  . ciprofloxacin Stopped (11/30/18 0933)  . metronidazole Stopped (11/30/18 1100)     LOS: 1 day   Time spent= 35 mins    Lexington Devine CArsenio Loader MD [facility]  If 7PM-7AM, please contact night-coverage [email or web address] 11/30/2018, 1:33 PM

## 2018-11-30 NOTE — Progress Notes (Signed)
Patient had about 200cc of blood liquid during the night.

## 2018-12-01 DIAGNOSIS — R197 Diarrhea, unspecified: Secondary | ICD-10-CM

## 2018-12-01 DIAGNOSIS — R4182 Altered mental status, unspecified: Secondary | ICD-10-CM

## 2018-12-01 DIAGNOSIS — K921 Melena: Secondary | ICD-10-CM

## 2018-12-01 DIAGNOSIS — K529 Noninfective gastroenteritis and colitis, unspecified: Secondary | ICD-10-CM

## 2018-12-01 DIAGNOSIS — K5731 Diverticulosis of large intestine without perforation or abscess with bleeding: Secondary | ICD-10-CM

## 2018-12-01 DIAGNOSIS — I959 Hypotension, unspecified: Secondary | ICD-10-CM

## 2018-12-01 LAB — MAGNESIUM: Magnesium: 1.6 mg/dL — ABNORMAL LOW (ref 1.7–2.4)

## 2018-12-01 LAB — BASIC METABOLIC PANEL
ANION GAP: 6 (ref 5–15)
BUN: <5 mg/dL — ABNORMAL LOW (ref 8–23)
CO2: 23 mmol/L (ref 22–32)
Calcium: 8.1 mg/dL — ABNORMAL LOW (ref 8.9–10.3)
Chloride: 105 mmol/L (ref 98–111)
Creatinine, Ser: 0.58 mg/dL (ref 0.44–1.00)
GFR calc non Af Amer: >60 mL/min (ref >60)
Glucose, Bld: 122 mg/dL — ABNORMAL HIGH (ref 70–99)
Potassium: 3.7 mmol/L (ref 3.5–5.1)
Sodium: 134 mmol/L — ABNORMAL LOW (ref 135–145)

## 2018-12-01 MED ORDER — MAGNESIUM OXIDE 400 (241.3 MG) MG PO TABS
800.0000 mg | ORAL_TABLET | Freq: Once | ORAL | Status: AC
  Administered 2018-12-01: 800 mg via ORAL
  Filled 2018-12-01: qty 2

## 2018-12-01 MED ORDER — CIPROFLOXACIN HCL 250 MG PO TABS
500.0000 mg | ORAL_TABLET | Freq: Two times a day (BID) | ORAL | Status: DC
  Administered 2018-12-01 – 2018-12-02 (×3): 500 mg via ORAL
  Filled 2018-12-01: qty 1
  Filled 2018-12-01: qty 2
  Filled 2018-12-01 (×3): qty 1
  Filled 2018-12-01: qty 2
  Filled 2018-12-01: qty 1
  Filled 2018-12-01: qty 2

## 2018-12-01 MED ORDER — TRAMADOL HCL 50 MG PO TABS
50.0000 mg | ORAL_TABLET | Freq: Two times a day (BID) | ORAL | Status: DC | PRN
  Administered 2018-12-01: 50 mg via ORAL
  Filled 2018-12-01: qty 1

## 2018-12-01 MED ORDER — HYDRALAZINE HCL 25 MG PO TABS
25.0000 mg | ORAL_TABLET | Freq: Four times a day (QID) | ORAL | Status: DC | PRN
  Administered 2018-12-01 – 2018-12-02 (×3): 25 mg via ORAL
  Filled 2018-12-01 (×3): qty 1

## 2018-12-01 MED ORDER — POTASSIUM CHLORIDE CRYS ER 20 MEQ PO TBCR
40.0000 meq | EXTENDED_RELEASE_TABLET | Freq: Once | ORAL | Status: AC
  Administered 2018-12-01: 40 meq via ORAL
  Filled 2018-12-01: qty 2

## 2018-12-01 MED ORDER — METRONIDAZOLE 500 MG PO TABS
500.0000 mg | ORAL_TABLET | Freq: Three times a day (TID) | ORAL | Status: DC
  Administered 2018-12-01 – 2018-12-02 (×4): 500 mg via ORAL
  Filled 2018-12-01 (×4): qty 1

## 2018-12-01 NOTE — Progress Notes (Signed)
PROGRESS NOTE    STELLA BORTLE  QXI:[id number] DOB: 05-11-1946 DOA: 11/28/2018 PCP: [name], MD   Brief Narrative:  73 year old with past medical history of diabetes mellitus type 2, hyperlipidemia, essential hypertension, hypothyroidism, CVA, recent cervical spinal surgery came to the hospital complains of generalized weakness, abdominal cramping and diarrhea.  Patient recently underwent spinal surgery at [facility].  CT of the abdomen pelvis here showed colitis extending from mid transverse colon to rectum.  C. difficile was negative.  Started on Cipro and Flagyl.  Initially also noted to be hypo-natremia which has been improving with IV fluids.  Gastroenterology team consulted.  C. difficile was negative, GI panel was negative.   Assessment & Plan:   Active Problems:   Colitis   Altered mental status   Diarrhea of presumed infectious origin   Hyponatremia   Hypotension   Hematochezia   Diverticulosis of colon with hemorrhage  Transverse and descending colon colitis Lower GI bleed - C. difficile is negative, GI panel -negative.  Ischemia/inflammatory versus infectious - Diet as tolerated, pain control.  Avoid NSAIDs.  Hemodynamically stable at this time -Gastroenterology following. -Continue Cipro and Flagyl, difficult IV access therefore cannot give it orally until we establish it.Rosana Fret added - Continue PPI twice daily  Status post cervical spine surgery C3-4 at [facility] -Currently stable.  Pain control-avoid NSAIDs -Give her narcotics if necessary with bowel regimen.  Hypomagnesemia -Repletion ordered.  Hyponatremia, likely due to dehydration -Continues to improve with hydration.  Today is 134.  Diabetes mellitus type 2 - Insulin sliding scale and Accu-Chek  Hypothyroidism -Continue Synthroid -TSH is within normal limits  DVT prophylaxis: SCDs Code Status: Full code Family Communication: Family at bedside Disposition Plan: Still to be determined until her p.o.  intake improves.  In the meantime she requires IV hydration as needed.  Consultants:   Gastroenterology  Procedures:   None  Antimicrobials:   Cipro and Flagyl   Subjective: Patient still has off-and-on nausea.  Slowly tolerating oral diet.  Reports of slight blood in her stool this morning but improved from yesterday.  Review of Systems Otherwise negative except as per HPI, including: General = no fevers, chills, dizziness, malaise, fatigue HEENT/EYES = negative for pain, redness, loss of vision, double vision, blurred vision, loss of hearing, sore throat, hoarseness, dysphagia Cardiovascular= negative for chest pain, palpitation, murmurs, lower extremity swelling Respiratory/lungs= negative for shortness of breath, cough, hemoptysis, wheezing, mucus production Gastrointestinal= negative for nausea, vomiting,, abdominal pain Genitourinary= negative for Dysuria, Hematuria, Change in Urinary Frequency MSK = Negative for arthralgia, myalgias, Back Pain, Joint swelling  Neurology= Negative for headache, seizures, numbness, tingling  Psychiatry= Negative for anxiety, depression, suicidal and homocidal ideation Allergy/Immunology= Medication/Food allergy as listed  Skin= Negative for Rash, lesions, ulcers, itching   Objective: Vitals:   11/30/18 1414 11/30/18 2011 11/30/18 2123 12/01/18 0500  BP: (!) 143/73 (!) 162/84  (!) 174/87  Pulse: 77 89  81  Resp: 16 17    Temp: 98.7 F (37.1 C) 99.1 F (37.3 C)  98.9 F (37.2 C)  TempSrc: Oral Oral    SpO2: 96% 97% 97% 100%  Weight:      Height:        Intake/Output Summary (Last 24 hours) at 12/01/2018 1053 Last data filed at 12/01/2018 0900 Gross per 24 hour  Intake 220 ml  Output -  Net 220 ml   Filed Weights   11/28/18 1906  Weight: 81.6 kg    Examination:  General  exam: Appears calm and comfortable  Respiratory system: Breath sounds are diminished at bilateral bases Cardiovascular system: S1 & S2 heard, RRR. No  JVD, murmurs, rubs, gallops or clicks. No pedal edema. Gastrointestinal system: Abdomen is nondistended, soft and nontender. No organomegaly or masses felt. Normal bowel sounds heard. Central nervous system: Alert and oriented. No focal neurological deficits. Extremities: Symmetric 4 x 5 power. Skin: No rashes, lesions or ulcers Psychiatry: Judgement and insight appear normal. Mood & affect appropriate.     Data Reviewed:   CBC: Recent Labs  Lab 11/28/18 2002 11/29/18 0514 11/29/18 1558 11/30/18 0548  WBC 19.9* 19.9*  --  12.1*  NEUTROABS 17.5*  --   --  10.1*  HGB 16.0* 13.1 11.5* 9.9*  HCT 51.8* 42.5 35.9* 30.8*  MCV 85.9 85.7  --  83.5  PLT 300 343  --  878   Basic Metabolic Panel: Recent Labs  Lab 11/28/18 2002 11/29/18 0514 11/30/18 0548 12/01/18 0657  NA 121* 126* 128* 134*  K 4.4 4.1 3.4* 3.7  CL 94* 97* 102 105  CO2 14* 18* 20* 23  GLUCOSE 134* 162* 117* 122*  BUN 15 15 7* <5*  CREATININE 0.97 0.93 0.69 0.58  CALCIUM 8.2* 7.7* 7.6* 8.1*  MG  --   --   --  1.6*   GFR: Estimated Creatinine Clearance: 68 mL/min (by C-G formula based on SCr of 0.58 mg/dL). Liver Function Tests: Recent Labs  Lab 11/28/18 2002 11/29/18 0514  AST 32 27  ALT 16 15  ALKPHOS 357* 261*  BILITOT 0.7 0.6  PROT 6.8 5.8*  ALBUMIN 3.4* 2.9*   Recent Labs  Lab 11/28/18 2002  LIPASE 68*   No results for input(s): AMMONIA in the last 168 hours. Coagulation Profile: No results for input(s): INR, PROTIME in the last 168 hours. Cardiac Enzymes: No results for input(s): CKTOTAL, CKMB, CKMBINDEX, TROPONINI in the last 168 hours. BNP (last 3 results) No results for input(s): PROBNP in the last 8760 hours. HbA1C: No results for input(s): HGBA1C in the last 72 hours. CBG: Recent Labs  Lab 11/28/18 1808  GLUCAP 189*   Lipid Profile: No results for input(s): CHOL, HDL, LDLCALC, TRIG, CHOLHDL, LDLDIRECT in the last 72 hours. Thyroid Function Tests: Recent Labs     11/29/18 0514  TSH 2.298   Anemia Panel: No results for input(s): VITAMINB12, FOLATE, FERRITIN, TIBC, IRON, RETICCTPCT in the last 72 hours. Sepsis Labs: Recent Labs  Lab 11/28/18 1756 11/29/18 1000 11/30/18 0548  LATICACIDVEN 3.36* 3.4* 1.1    Recent Results (from the past 240 hour(s))  C difficile quick scan w PCR reflex     Status: None   Collection Time: 11/28/18  7:15 PM  Result Value Ref Range Status   C Diff antigen NEGATIVE NEGATIVE Final   C Diff toxin NEGATIVE NEGATIVE Final   C Diff interpretation No C. difficile detected.  Final    Comment: Performed at [facility], [address], [place], [place] [address]  Gastrointestinal Panel by PCR , Stool     Status: None   Collection Time: 11/28/18  7:15 PM  Result Value Ref Range Status   Campylobacter species NOT DETECTED NOT DETECTED Final   Plesimonas shigelloides NOT DETECTED NOT DETECTED Final   Salmonella species NOT DETECTED NOT DETECTED Final   Yersinia enterocolitica NOT DETECTED NOT DETECTED Final   Vibrio species NOT DETECTED NOT DETECTED Final   Vibrio cholerae NOT DETECTED NOT DETECTED Final   Enteroaggregative E coli (EAEC)  NOT DETECTED NOT DETECTED Final   Enteropathogenic E coli (EPEC) NOT DETECTED NOT DETECTED Final   Enterotoxigenic E coli (ETEC) NOT DETECTED NOT DETECTED Final   Shiga like toxin producing E coli (STEC) NOT DETECTED NOT DETECTED Final   Shigella/Enteroinvasive E coli (EIEC) NOT DETECTED NOT DETECTED Final   Cryptosporidium NOT DETECTED NOT DETECTED Final   Cyclospora cayetanensis NOT DETECTED NOT DETECTED Final   Entamoeba histolytica NOT DETECTED NOT DETECTED Final   Giardia lamblia NOT DETECTED NOT DETECTED Final   Adenovirus F40/41 NOT DETECTED NOT DETECTED Final   Astrovirus NOT DETECTED NOT DETECTED Final   Norovirus GI/GII NOT DETECTED NOT DETECTED Final   Rotavirus A NOT DETECTED NOT DETECTED Final   Sapovirus (I, II, IV, and V) NOT DETECTED NOT DETECTED Final    Comment:  Performed at [facility], [address] [place], [place] [address]  Blood Culture (routine x 2)     Status: None (Preliminary result)   Collection Time: 11/28/18  8:02 PM  Result Value Ref Range Status   Specimen Description BLOOD RIGHT HAND  Final   Special Requests   Final    BOTTLES DRAWN AEROBIC AND ANAEROBIC Blood Culture adequate volume   Culture   Final    NO GROWTH 3 DAYS Performed at [facility], [address] [place], [place] [address]    Report Status PENDING  Incomplete  Blood Culture (routine x 2)     Status: None (Preliminary result)   Collection Time: 11/28/18  8:04 PM  Result Value Ref Range Status   Specimen Description BLOOD RIGHT ARM  Final   Special Requests AEROBIC BOTTLE ONLY Blood Culture adequate volume  Final   Culture   Final    NO GROWTH 3 DAYS Performed at [facility], [address], [place], [place] [address]    Report Status PENDING  Incomplete  Urine culture     Status: None   Collection Time: 11/28/18 10:23 PM  Result Value Ref Range Status   Specimen Description   Final    URINE, CATHETERIZED Performed at [facility], [address], [place], [place] [address]    Special Requests   Final    Normal Performed at [facility], [address], [place], [place] [address]    Culture   Final    NO GROWTH Performed at [facility], [place] [address], [place], [place] [address]    Report Status 11/30/2018 FINAL  Final         Radiology Studies: No results found.      Scheduled Meds: . atenolol  100 mg Oral q morning - 10a  . ciprofloxacin  500 mg Oral BID  . citalopram  20 mg Oral QODAY  . folic acid  1,194 mcg Oral Daily  . levothyroxine  88 mcg Oral Q0600  . loratadine  10 mg Oral Daily  . metroNIDAZOLE  500 mg Oral Q8H  . pantoprazole  40 mg Oral BID AC  . saccharomyces boulardii  250 mg Oral BID  . vitamin B-12  1,000 mcg Oral Daily   Continuous Infusions: . sodium chloride 100 mL/hr at 11/30/18 2343      LOS: 2 days   Time spent= 35 mins    Ankit Arsenio Loader, MD [facility]  If 7PM-7AM, please contact night-coverage [email or web address] 12/01/2018, 10:53 AM

## 2018-12-01 NOTE — Progress Notes (Signed)
  Subjective:  Patient says she feels better.  She had one bowel movement this morning and noted only small amount of blood.  She says most of her pain is on the left side.  She is not having nausea or vomiting with full liquids.  Objective: Blood pressure (!) 174/87, pulse 81, temperature 98.9 F (37.2 C), resp. rate 17, height 5' 5"  (1.651 m), weight 81.6 kg, SpO2 100 %. Patient is alert and in no acute distress. Abdomen is full.  Bowel sounds are normal.  She has mild generalized tenderness which is more pronounced in left lower quadrant and left midabdomen.  No guarding or rebound.  No organomegaly or masses..  Labs/studies Results:  CBC Latest Ref Rng & Units 11/30/2018 11/29/2018 11/29/2018  WBC 4.0 - 10.5 K/uL 12.1(H) - 19.9(H)  Hemoglobin 12.0 - 15.0 g/dL 9.9(L) 11.5(L) 13.1  Hematocrit 36.0 - 46.0 % 30.8(L) 35.9(L) 42.5  Platelets 150 - 400 K/uL 256 - 343    CMP Latest Ref Rng & Units 12/01/2018 11/30/2018 11/29/2018  Glucose 70 - 99 mg/dL 122(H) 117(H) 162(H)  BUN 8 - 23 mg/dL <5(L) 7(L) 15  Creatinine 0.44 - 1.00 mg/dL 0.58 0.69 0.93  Sodium 135 - 145 mmol/L 134(L) 128(L) 126(L)  Potassium 3.5 - 5.1 mmol/L 3.7 3.4(L) 4.1  Chloride 98 - 111 mmol/L 105 102 97(L)  CO2 22 - 32 mmol/L 23 20(L) 18(L)  Calcium 8.9 - 10.3 mg/dL 8.1(L) 7.6(L) 7.7(L)  Total Protein 6.5 - 8.1 g/dL - - 5.8(L)  Total Bilirubin 0.3 - 1.2 mg/dL - - 0.6  Alkaline Phos 38 - 126 U/L - - 261(H)  AST 15 - 41 U/L - - 27  ALT 0 - 44 U/L - - 15    Hepatic Function Latest Ref Rng & Units 11/29/2018 11/28/2018 09/01/2018  Total Protein 6.5 - 8.1 g/dL 5.8(L) 6.8 7.1  Albumin 3.5 - 5.0 g/dL 2.9(L) 3.4(L) 3.8  AST 15 - 41 U/L 27 32 22  ALT 0 - 44 U/L 15 16 21   Alk Phosphatase 38 - 126 U/L 261(H) 357(H) 46  Total Bilirubin 0.3 - 1.2 mg/dL 0.6 0.7 0.4  Bilirubin, Direct 0.0 - 0.3 mg/dL - - -     Assessment:  #1.  Acute left-sided colitis.  History and CT findings typical of ischemic colitis.  Symptomatically she is  getting better.  She is tolerating full liquids.    #2.  Anemia secondary to GI bleed.  Hemoglobin has dropped by more than 3 g since admission.  She is not actively bleeding.   Recommendations:  Agree with switching her to p.o. antibiotics. CTA abdomen and pelvis on an outpatient basis. CBC in a.m.

## 2018-12-02 DIAGNOSIS — K5731 Diverticulosis of large intestine without perforation or abscess with bleeding: Secondary | ICD-10-CM

## 2018-12-02 LAB — CBC
HCT: 33.2 % — ABNORMAL LOW (ref 36.0–46.0)
Hemoglobin: 10.4 g/dL — ABNORMAL LOW (ref 12.0–15.0)
MCH: 27.2 pg (ref 26.0–34.0)
MCHC: 31.3 g/dL (ref 30.0–36.0)
MCV: 86.7 fL (ref 80.0–100.0)
Platelets: 284 10*3/uL (ref 150–400)
RBC: 3.83 MIL/uL — ABNORMAL LOW (ref 3.87–5.11)
RDW: 15.6 % — AB (ref 11.5–15.5)
WBC: 8 10*3/uL (ref 4.0–10.5)
nRBC: 0 % (ref 0.0–0.2)

## 2018-12-02 LAB — BASIC METABOLIC PANEL
Anion gap: 7 (ref 5–15)
BUN: <5 mg/dL — ABNORMAL LOW (ref 8–23)
CO2: 23 mmol/L (ref 22–32)
Calcium: 8.1 mg/dL — ABNORMAL LOW (ref 8.9–10.3)
Chloride: 103 mmol/L (ref 98–111)
Creatinine, Ser: 0.64 mg/dL (ref 0.44–1.00)
GFR calc Af Amer: >60 mL/min (ref >60)
GLUCOSE: 117 mg/dL — AB (ref 70–99)
Potassium: 3.9 mmol/L (ref 3.5–5.1)
Sodium: 133 mmol/L — ABNORMAL LOW (ref 135–145)

## 2018-12-02 LAB — MAGNESIUM: Magnesium: 1.7 mg/dL (ref 1.7–2.4)

## 2018-12-02 MED ORDER — LOSARTAN POTASSIUM 50 MG PO TABS
50.0000 mg | ORAL_TABLET | Freq: Every day | ORAL | Status: DC
  Administered 2018-12-02: 50 mg via ORAL
  Filled 2018-12-02: qty 1

## 2018-12-02 MED ORDER — POLYETHYLENE GLYCOL 3350 17 G PO PACK: 17.0000 g | PACK | Freq: Every day | ORAL | 0 refills | Status: DC | PRN

## 2018-12-02 MED ORDER — SACCHAROMYCES BOULARDII 250 MG PO CAPS: 250.0000 mg | ORAL_CAPSULE | Freq: Two times a day (BID) | ORAL | 0 refills | Status: DC

## 2018-12-02 MED ORDER — AMLODIPINE BESYLATE 5 MG PO TABS: 5.0000 mg | ORAL_TABLET | Freq: Every day | ORAL | 0 refills | Status: DC

## 2018-12-02 MED ORDER — METRONIDAZOLE 500 MG PO TABS: 500.0000 mg | ORAL_TABLET | Freq: Three times a day (TID) | ORAL | 0 refills | Status: DC

## 2018-12-02 MED ORDER — CIPROFLOXACIN HCL 500 MG PO TABS: 500.0000 mg | ORAL_TABLET | Freq: Two times a day (BID) | ORAL | 0 refills | Status: DC

## 2018-12-02 MED ORDER — METRONIDAZOLE 500 MG PO TABS: 500.0000 mg | ORAL_TABLET | Freq: Three times a day (TID) | ORAL | 0 refills | Status: AC

## 2018-12-02 MED ORDER — SACCHAROMYCES BOULARDII 250 MG PO CAPS: 250.0000 mg | ORAL_CAPSULE | Freq: Two times a day (BID) | ORAL | 0 refills | Status: AC

## 2018-12-02 MED ORDER — LISINOPRIL 10 MG PO TABS: 10.0000 mg | ORAL_TABLET | Freq: Every day | ORAL | 0 refills | Status: DC

## 2018-12-02 MED ORDER — AMLODIPINE BESYLATE 5 MG PO TABS
5.0000 mg | ORAL_TABLET | Freq: Every day | ORAL | Status: DC
  Administered 2018-12-02: 5 mg via ORAL
  Filled 2018-12-02: qty 1

## 2018-12-02 MED ORDER — CIPROFLOXACIN HCL 500 MG PO TABS: 500.0000 mg | ORAL_TABLET | Freq: Two times a day (BID) | ORAL | 0 refills | Status: AC

## 2018-12-02 NOTE — Progress Notes (Signed)
PROGRESS NOTE    Brittany Goodman  FIE:[id number] DOB: 10-31-1946 DOA: 11/28/2018 PCP: [name], MD   Brief Narrative:  73 year old with past medical history of diabetes mellitus type 2, hyperlipidemia, essential hypertension, hypothyroidism, CVA, recent cervical spinal surgery came to the hospital complains of generalized weakness, abdominal cramping and diarrhea.  Patient recently underwent spinal surgery at [facility].  CT of the abdomen pelvis here showed colitis extending from mid transverse colon to rectum.  C. difficile was negative.  Started on Cipro and Flagyl.  Initially also noted to be hypo-natremia which has been improving with IV fluids.  Gastroenterology team consulted.  C. difficile was negative, GI panel was negative.   Assessment & Plan:   Active Problems:   Colitis   Altered mental status   Diarrhea of presumed infectious origin   Hyponatremia   Hypotension   Hematochezia   Diverticulosis of colon with hemorrhage  Transverse and descending colon colitis Lower GI bleed - C. difficile is negative, GI panel -negative.  Ischemia/inflammatory versus infectious - Diet as tolerated, pain control.  Avoid NSAIDs.  Hemodynamically stable at this time -Gastroenterology following. -Continue oral Cipro and Flagyl.   Florastor added - Continue PPI twice daily  Status post cervical spine surgery C3-4 at [facility] -Currently stable.  Pain control-avoid NSAIDs -Give her narcotics if necessary with bowel regimen.  Essential hypertension - poorly controlled, added amlodipine/losartan 1/13  Hypomagnesemia -Repletion ordered.  Hyponatremia, likely due to dehydration -Continues to improve with hydration.  Today is 134.  Diabetes mellitus type 2 - diet controlled  Hypothyroidism -Continue Synthroid -TSH is within normal limits  DVT prophylaxis: SCDs Code Status: Full code Family Communication: Family at bedside Disposition Plan: home tomorrow if tolerates diet  Consultants:     Gastroenterology  Procedures:   None  Antimicrobials:   Cipro and Flagyl   Subjective: Tolerating full liquids better and would like to advance to soft diet.    Review of Systems Otherwise negative except as per HPI, including: General = no fevers, chills, dizziness, malaise, fatigue HEENT/EYES = negative for pain, redness, loss of vision, double vision, blurred vision, loss of hearing, sore throat, hoarseness, dysphagia Cardiovascular= negative for chest pain, palpitation, murmurs, lower extremity swelling Respiratory/lungs= negative for shortness of breath, cough, hemoptysis, wheezing, mucus production Gastrointestinal= negative for nausea, vomiting,, abdominal pain Genitourinary= negative for Dysuria, Hematuria, Change in Urinary Frequency MSK = Negative for arthralgia, myalgias, Back Pain, Joint swelling  Neurology= Negative for headache, seizures, numbness, tingling  Psychiatry= Negative for anxiety, depression, suicidal and homocidal ideation Allergy/Immunology= Medication/Food allergy as listed  Skin= Negative for Rash, lesions, ulcers, itching  Objective: Vitals:   12/01/18 2240 12/02/18 0011 12/02/18 0531 12/02/18 0649  BP: (!) 170/102 (!) 169/81 (!) 196/99 (!) 172/84  Pulse: 72 77 75 77  Resp:   16   Temp: 98.3 F (36.8 C)  98.4 F (36.9 C)   TempSrc: Oral  Oral   SpO2: 98%  99%   Weight:      Height:        Intake/Output Summary (Last 24 hours) at 12/02/2018 0926 Last data filed at 12/02/2018 0458 Gross per 24 hour  Intake 3993.41 ml  Output -  Net 3993.41 ml   Filed Weights   11/28/18 1906  Weight: 81.6 kg    Examination:  General exam: Appears calm and comfortable, NAD.  Respiratory system: Breath sounds are diminished at bilateral bases Cardiovascular system: normal S1 & S2 heard. No JVD, murmurs, rubs, gallops or clicks.  No pedal edema. Gastrointestinal system: Abdomen is nondistended, soft and nontender. No organomegaly or masses felt.  Normal bowel sounds heard. Central nervous system: Alert and oriented. No focal neurological deficits. Extremities: Symmetric 4 x 5 power. Skin: No rashes, lesions or ulcers Psychiatry: Judgement and insight appear normal. Mood & affect appropriate.   Data Reviewed:   CBC: Recent Labs  Lab 11/28/18 2002 11/29/18 0514 11/29/18 1558 11/30/18 0548 12/02/18 0707  WBC 19.9* 19.9*  --  12.1* 8.0  NEUTROABS 17.5*  --   --  10.1*  --   HGB 16.0* 13.1 11.5* 9.9* 10.4*  HCT 51.8* 42.5 35.9* 30.8* 33.2*  MCV 85.9 85.7  --  83.5 86.7  PLT 300 343  --  256 937   Basic Metabolic Panel: Recent Labs  Lab 11/28/18 2002 11/29/18 0514 11/30/18 0548 12/01/18 0657 12/02/18 0707  NA 121* 126* 128* 134* 133*  K 4.4 4.1 3.4* 3.7 3.9  CL 94* 97* 102 105 103  CO2 14* 18* 20* 23 23  GLUCOSE 134* 162* 117* 122* 117*  BUN 15 15 7* <5* <5*  CREATININE 0.97 0.93 0.69 0.58 0.64  CALCIUM 8.2* 7.7* 7.6* 8.1* 8.1*  MG  --   --   --  1.6* 1.7   GFR: Estimated Creatinine Clearance: 68 mL/min (by C-G formula based on SCr of 0.64 mg/dL). Liver Function Tests: Recent Labs  Lab 11/28/18 2002 11/29/18 0514  AST 32 27  ALT 16 15  ALKPHOS 357* 261*  BILITOT 0.7 0.6  PROT 6.8 5.8*  ALBUMIN 3.4* 2.9*   Recent Labs  Lab 11/28/18 2002  LIPASE 68*   No results for input(s): AMMONIA in the last 168 hours. Coagulation Profile: No results for input(s): INR, PROTIME in the last 168 hours. Cardiac Enzymes: No results for input(s): CKTOTAL, CKMB, CKMBINDEX, TROPONINI in the last 168 hours. BNP (last 3 results) No results for input(s): PROBNP in the last 8760 hours. HbA1C: No results for input(s): HGBA1C in the last 72 hours. CBG: Recent Labs  Lab 11/28/18 1808  GLUCAP 189*   Lipid Profile: No results for input(s): CHOL, HDL, LDLCALC, TRIG, CHOLHDL, LDLDIRECT in the last 72 hours. Thyroid Function Tests: No results for input(s): TSH, T4TOTAL, FREET4, T3FREE, THYROIDAB in the last 72  hours. Anemia Panel: No results for input(s): VITAMINB12, FOLATE, FERRITIN, TIBC, IRON, RETICCTPCT in the last 72 hours. Sepsis Labs: Recent Labs  Lab 11/28/18 1756 11/29/18 1000 11/30/18 0548  LATICACIDVEN 3.36* 3.4* 1.1    Recent Results (from the past 240 hour(s))  C difficile quick scan w PCR reflex     Status: None   Collection Time: 11/28/18  7:15 PM  Result Value Ref Range Status   C Diff antigen NEGATIVE NEGATIVE Final   C Diff toxin NEGATIVE NEGATIVE Final   C Diff interpretation No C. difficile detected.  Final    Comment: Performed at [facility], [address], [place], [place] [address]  Gastrointestinal Panel by PCR , Stool     Status: None   Collection Time: 11/28/18  7:15 PM  Result Value Ref Range Status   Campylobacter species NOT DETECTED NOT DETECTED Final   Plesimonas shigelloides NOT DETECTED NOT DETECTED Final   Salmonella species NOT DETECTED NOT DETECTED Final   Yersinia enterocolitica NOT DETECTED NOT DETECTED Final   Vibrio species NOT DETECTED NOT DETECTED Final   Vibrio cholerae NOT DETECTED NOT DETECTED Final   Enteroaggregative E coli (EAEC) NOT DETECTED NOT DETECTED Final   Enteropathogenic  E coli (EPEC) NOT DETECTED NOT DETECTED Final   Enterotoxigenic E coli (ETEC) NOT DETECTED NOT DETECTED Final   Shiga like toxin producing E coli (STEC) NOT DETECTED NOT DETECTED Final   Shigella/Enteroinvasive E coli (EIEC) NOT DETECTED NOT DETECTED Final   Cryptosporidium NOT DETECTED NOT DETECTED Final   Cyclospora cayetanensis NOT DETECTED NOT DETECTED Final   Entamoeba histolytica NOT DETECTED NOT DETECTED Final   Giardia lamblia NOT DETECTED NOT DETECTED Final   Adenovirus F40/41 NOT DETECTED NOT DETECTED Final   Astrovirus NOT DETECTED NOT DETECTED Final   Norovirus GI/GII NOT DETECTED NOT DETECTED Final   Rotavirus A NOT DETECTED NOT DETECTED Final   Sapovirus (I, II, IV, and V) NOT DETECTED NOT DETECTED Final    Comment: Performed at [facility], [address] [place], [place] [address]  Blood Culture (routine x 2)     Status: None (Preliminary result)   Collection Time: 11/28/18  8:02 PM  Result Value Ref Range Status   Specimen Description BLOOD RIGHT HAND  Final   Special Requests   Final    BOTTLES DRAWN AEROBIC AND ANAEROBIC Blood Culture adequate volume   Culture   Final    NO GROWTH 4 DAYS Performed at [facility], [address], [place], [place] [address]    Report Status PENDING  Incomplete  Blood Culture (routine x 2)     Status: None (Preliminary result)   Collection Time: 11/28/18  8:04 PM  Result Value Ref Range Status   Specimen Description BLOOD RIGHT ARM  Final   Special Requests AEROBIC BOTTLE ONLY Blood Culture adequate volume  Final   Culture   Final    NO GROWTH 4 DAYS Performed at [facility], [address], [place], [place] [address]    Report Status PENDING  Incomplete  Urine culture     Status: None   Collection Time: 11/28/18 10:23 PM  Result Value Ref Range Status   Specimen Description   Final    URINE, CATHETERIZED Performed at [facility], [address], [place], [place] [address]    Special Requests   Final    Normal Performed at [facility], [address], [place], [place] [address]    Culture   Final    NO GROWTH Performed at [facility], [place] [address] [place], [place] [address]    Report Status 11/30/2018 FINAL  Final    Radiology Studies: No results found.  Scheduled Meds: . amLODipine  5 mg Oral Daily  . atenolol  100 mg Oral q morning - 10a  . ciprofloxacin  500 mg Oral BID  . citalopram  20 mg Oral QODAY  . folic acid  6,834 mcg Oral Daily  . levothyroxine  88 mcg Oral Q0600  . loratadine  10 mg Oral Daily  . losartan  50 mg Oral Daily  . metroNIDAZOLE  500 mg Oral Q8H  . pantoprazole  40 mg Oral BID AC  . saccharomyces boulardii  250 mg Oral BID  . vitamin B-12  1,000 mcg Oral Daily   Continuous Infusions: . sodium chloride 100 mL/hr  at 12/02/18 0458     LOS: 3 days   Time spent= 27 mins  Juandavid Dallman, MD [facility]  If 7PM-7AM, please contact night-coverage [email or web address] 12/02/2018, 9:26 AM

## 2018-12-02 NOTE — Discharge Summary (Signed)
Physician Discharge Summary  Brittany Goodman RWE:[id number] DOB: [birth year] DOA: 11/28/2018  PCP: [name], MD GI: [place]  Admit date: 11/28/2018 Discharge date: 12/02/2018  Admitted From: Home  Disposition: Home   Recommendations for Outpatient Follow-up:  1. Follow up with PCP in 1 weeks 2. Follow up with GI in 2 weeks for outpatient CTA abdomen  Discharge Condition: STABLE   CODE STATUS: DNR    Brief Hospitalization Summary: Please see all hospital notes, images, labs for full details of the hospitalization. Dr.Lama's HPI: Brittany Goodman  is a 73 y.o. female, with history of diabetes mellitus, hyperlipidemia, hypertension, hypothyroidism, prior CVA, who had recent cervical spine surgery C3-4 for cervical spondylosis with myelopathy at [facility].  Patient was taking pain medications sparingly and overall improving.  At 11:30 AM she told her son that she needs to go to bathroom.  She went downstairs to her bathroom with some assistance and submitted for her.  He states that he heard a thud against a wall and went to see patient with weakness and leaning against a wall.  She had not fallen.  Did not pass out.  Patient has been complaining of abdominal cramping. When patient came to the ED she had multiple episodes of diarrhea. Her mental status improved after she received Narcan. CT scan showed inflammation of the transverse colon to rectum C. difficile PCR was negative Patient was started on cefepime, vancomycin and Flagyl for possible sepsis initially. She denies chest pain or shortness of breath. She had 2 episodes of vomiting. Denies dysuria. No previous history of stroke or seizures.  Brief Narrative:  73 year old with past medical history of diabetes mellitus type 2, hyperlipidemia, essential hypertension, hypothyroidism, CVA, recent cervical spinal surgery came to the hospital complains of generalized weakness, abdominal cramping and diarrhea.  Patient recently underwent  spinal surgery at [facility].  CT of the abdomen pelvis here showed colitis extending from mid transverse colon to rectum.  C. difficile was negative.  Started on Cipro and Flagyl.  Initially also noted to be hypo-natremia which has been improving with IV fluids.  Gastroenterology team consulted.  C. difficile was negative, GI panel was negative.  Assessment & Plan:   Active Problems:   Colitis   Altered mental status   Diarrhea of presumed infectious origin   Hyponatremia   Hypotension   Hematochezia   Diverticulosis of colon with hemorrhage  Transverse and descending colon colitis Lower GI bleed - C. difficile is negative, GI panel -negative.  Ischemia/inflammatory versus infectious - Diet as tolerated, pain control.  Avoid NSAIDs.  Hemodynamically stable at this time -Gastroenterology following. -Continue oral Cipro and Flagyl x 4 more days per Dr. Laural Golden.  GI says ok to discharge home today.   Pt to follow up with Dr. Orvan Falconer office for outpatient CTA abdomen.  - Continue PPI (home omeprazole).  Status post cervical spine surgery C3-4 at [facility] -Currently stable.  Pain control-avoid NSAIDs -Give her narcotics if necessary with bowel regimen.  Essential hypertension - poorly controlled, added amlodipine, increased lisinopril to 10 mg, follow up with PCP  Hypomagnesemia -Repletion ordered.  Hyponatremia, likely due to dehydration -Improved with IV normal saline.  Diabetes mellitus type 2 - resume home treatment regimen  Hypothyroidism -Continue Synthroid -TSH is within normal limits  DVT prophylaxis: SCDs Code Status: Full code Family Communication: Family at bedside Disposition Plan: home  Consultants:   Gastroenterology  Procedures:   None  Antimicrobials:   Cipro and Flagyl  Discharge Diagnoses:  Active  Problems:   Colitis   Altered mental status   Diarrhea of presumed infectious origin   Hyponatremia   Hypotension   Hematochezia    Diverticulosis of colon with hemorrhage  Discharge Instructions: Discharge Instructions    Call MD for:  difficulty breathing, headache or visual disturbances   Complete by:  As directed    Call MD for:  extreme fatigue   Complete by:  As directed    Call MD for:  persistant nausea and vomiting   Complete by:  As directed    Call MD for:  severe uncontrolled pain   Complete by:  As directed    Increase activity slowly   Complete by:  As directed      Allergies as of 12/02/2018      Reactions   Celecoxib Swelling   Pt states she can tolerate ibuprofen   Codeine Other (See Comments)   "makes me feel bad"   Dilaudid [hydromorphone Hcl] Other (See Comments)   hallucination   Morphine Nausea And Vomiting   Statins    Muscle pain   Tetracycline Nausea And Vomiting      Medication List    TAKE these medications   acetaminophen 500 MG tablet Commonly known as:  TYLENOL Take 1,000 mg by mouth 2 (two) times daily as needed for mild pain or moderate pain.   amLODipine 5 MG tablet Commonly known as:  NORVASC Take 1 tablet (5 mg total) by mouth daily for 30 days. Start taking on:  December 03, 2018   atenolol 100 MG tablet Commonly known as:  TENORMIN Take 100 mg by mouth every morning.   Biotin 5000 MCG Tabs Take 1 tablet by mouth daily.   ciprofloxacin 500 MG tablet Commonly known as:  CIPRO Take 1 tablet (500 mg total) by mouth 2 (two) times daily for 4 days.   citalopram 20 MG tablet Commonly known as:  CELEXA Take 20 mg by mouth every other day.   cyclobenzaprine 10 MG tablet Commonly known as:  FLEXERIL Take 10 mg by mouth 2 (two) times daily as needed for muscle spasms.   folic acid 409 MCG tablet Commonly known as:  FOLVITE Take 800 mcg by mouth daily.   glimepiride 1 MG tablet Commonly known as:  AMARYL Take 1 mg by mouth daily before breakfast.   HYDROcodone-acetaminophen 5-325 MG tablet Commonly known as:  NORCO/VICODIN Take 1 tablet by mouth every 4  (four) hours as needed for moderate pain.   levothyroxine 88 MCG tablet Commonly known as:  SYNTHROID, LEVOTHROID Take 88 mcg by mouth daily before breakfast.   lisinopril 10 MG tablet Commonly known as:  PRINIVIL,ZESTRIL Take 1 tablet (10 mg total) by mouth daily for 30 days. Start taking on:  December 03, 2018 What changed:    medication strength  how much to take   loratadine 10 MG tablet Commonly known as:  CLARITIN Take 10 mg by mouth daily.   metFORMIN 1000 MG tablet Commonly known as:  GLUCOPHAGE Take 1,000 mg by mouth 2 (two) times daily with a meal.   metroNIDAZOLE 500 MG tablet Commonly known as:  FLAGYL Take 1 tablet (500 mg total) by mouth every 8 (eight) hours for 4 days.   NONFORMULARY OR COMPOUNDED ITEM Apply 1 application topically 3 (three) times daily as needed (Pain). Diclofenac, Baclofen, Gabapentin, Lidocaine, Menthol - Compounded at [facility]   omeprazole 20 MG capsule Commonly known as:  PRILOSEC Take 20 mg by mouth daily.  polyethylene glycol packet Commonly known as:  MIRALAX / GLYCOLAX Take 17 g by mouth daily as needed for moderate constipation or severe constipation. What changed:    when to take this  reasons to take this  additional instructions   saccharomyces boulardii 250 MG capsule Commonly known as:  FLORASTOR Take 1 capsule (250 mg total) by mouth 2 (two) times daily for 8 days.   vitamin B-12 1000 MCG tablet Commonly known as:  CYANOCOBALAMIN Take 1,000 mcg by mouth daily.   vitamin C 1000 MG tablet Take 1,000-2,000 mg by mouth daily.   Vitamin D 125 MCG (5000 UT) Caps Take 1 capsule by mouth daily.      Follow-up Information    [name], MD. Schedule an appointment as soon as possible for a visit in 1 week(s).   Specialty:  Family Medicine Why:  Hospital Follow Up  Contact information: [address] [place] [address] [phone]        Daneil Dolin, MD. Schedule an appointment  as soon as possible for a visit in 2 week(s).   Specialty:  Gastroenterology Why:  Hospital Follow Up  Contact information: [place] [address] [phone]          Allergies  Allergen Reactions  . Celecoxib Swelling    Pt states she can tolerate ibuprofen  . Codeine Other (See Comments)    "makes me feel bad"  . Dilaudid [Hydromorphone Hcl] Other (See Comments)    hallucination  . Morphine Nausea And Vomiting  . Statins     Muscle pain  . Tetracycline Nausea And Vomiting   Allergies as of 12/02/2018      Reactions   Celecoxib Swelling   Pt states she can tolerate ibuprofen   Codeine Other (See Comments)   "makes me feel bad"   Dilaudid [hydromorphone Hcl] Other (See Comments)   hallucination   Morphine Nausea And Vomiting   Statins    Muscle pain   Tetracycline Nausea And Vomiting      Medication List    TAKE these medications   acetaminophen 500 MG tablet Commonly known as:  TYLENOL Take 1,000 mg by mouth 2 (two) times daily as needed for mild pain or moderate pain.   amLODipine 5 MG tablet Commonly known as:  NORVASC Take 1 tablet (5 mg total) by mouth daily for 30 days. Start taking on:  December 03, 2018   atenolol 100 MG tablet Commonly known as:  TENORMIN Take 100 mg by mouth every morning.   Biotin 5000 MCG Tabs Take 1 tablet by mouth daily.   ciprofloxacin 500 MG tablet Commonly known as:  CIPRO Take 1 tablet (500 mg total) by mouth 2 (two) times daily for 4 days.   citalopram 20 MG tablet Commonly known as:  CELEXA Take 20 mg by mouth every other day.   cyclobenzaprine 10 MG tablet Commonly known as:  FLEXERIL Take 10 mg by mouth 2 (two) times daily as needed for muscle spasms.   folic acid 364 MCG tablet Commonly known as:  FOLVITE Take 800 mcg by mouth daily.   glimepiride 1 MG tablet Commonly known as:  AMARYL Take 1 mg by mouth daily before breakfast.   HYDROcodone-acetaminophen 5-325 MG tablet Commonly  known as:  NORCO/VICODIN Take 1 tablet by mouth every 4 (four) hours as needed for moderate pain.   levothyroxine 88 MCG tablet Commonly known as:  SYNTHROID, LEVOTHROID Take 88 mcg by mouth daily before breakfast.  lisinopril 10 MG tablet Commonly known as:  PRINIVIL,ZESTRIL Take 1 tablet (10 mg total) by mouth daily for 30 days. Start taking on:  December 03, 2018 What changed:    medication strength  how much to take   loratadine 10 MG tablet Commonly known as:  CLARITIN Take 10 mg by mouth daily.   metFORMIN 1000 MG tablet Commonly known as:  GLUCOPHAGE Take 1,000 mg by mouth 2 (two) times daily with a meal.   metroNIDAZOLE 500 MG tablet Commonly known as:  FLAGYL Take 1 tablet (500 mg total) by mouth every 8 (eight) hours for 4 days.   NONFORMULARY OR COMPOUNDED ITEM Apply 1 application topically 3 (three) times daily as needed (Pain). Diclofenac, Baclofen, Gabapentin, Lidocaine, Menthol - Compounded at [facility]   omeprazole 20 MG capsule Commonly known as:  PRILOSEC Take 20 mg by mouth daily.   polyethylene glycol packet Commonly known as:  MIRALAX / GLYCOLAX Take 17 g by mouth daily as needed for moderate constipation or severe constipation. What changed:    when to take this  reasons to take this  additional instructions   saccharomyces boulardii 250 MG capsule Commonly known as:  FLORASTOR Take 1 capsule (250 mg total) by mouth 2 (two) times daily for 8 days.   vitamin B-12 1000 MCG tablet Commonly known as:  CYANOCOBALAMIN Take 1,000 mcg by mouth daily.   vitamin C 1000 MG tablet Take 1,000-2,000 mg by mouth daily.   Vitamin D 125 MCG (5000 UT) Caps Take 1 capsule by mouth daily.       Procedures/Studies: Ct Head Wo Contrast  Result Date: 11/28/2018 CLINICAL DATA:  Altered mental status EXAM: CT HEAD WITHOUT CONTRAST TECHNIQUE: Contiguous axial images were obtained from the base of the skull through the vertex without intravenous  contrast. COMPARISON:  Head CT 03/01/2017 FINDINGS: Brain: No acute territorial infarction, hemorrhage, or intracranial mass is visualized. The ventricles are nonenlarged. Vascular: No hyperdense vessels.  Carotid vascular calcification Skull: Normal. Negative for fracture or focal lesion. Sinuses/Orbits: Mucosal thickening in the sphenoid sinus on the right side. Small debris in the posterior left nasal passage. Other: None IMPRESSION: Negative.  No CT evidence for acute intracranial abnormality. Electronically Signed   By: Donavan Foil M.D.   On: 11/28/2018 18:15   Ct Cervical Spine Wo Contrast  Result Date: 11/28/2018 CLINICAL DATA:  Initial evaluation for recent cervical spine fusion, pain. EXAM: CT CERVICAL SPINE WITHOUT CONTRAST TECHNIQUE: Multidetector CT imaging of the cervical spine was performed without intravenous contrast. Multiplanar CT image reconstructions were also generated. COMPARISON:  Prior MRI from 09/13/2018. FINDINGS: Alignment: Straightening with slight reversal of the normal cervical lordosis, apex at C4-5. Trace anterolisthesis of C3 on C4, and C5 on C6, most likely chronic and degenerative. Skull base and vertebrae: Skull base intact. Mild rotation of C1 on C2 likely positional. Normal C1-2 articulations otherwise preserved. Dens intact. Vertebral body heights maintained. No acute fracture. Soft tissues and spinal canal: Prevertebral edema with layering effusion and soft tissue emphysema, most likely postoperative in nature related to recent C3-4 ACDF. No frank loculated collections identified. Additional soft tissue stranding seen along the left-sided neck approach. No complication or adverse features. Spinal canal within normal limits. Vascular calcifications noted about the carotid bifurcations. Disc levels: Recent ACDF at C3-4. Hardware appears intact and well position without complication small amount of postoperative gas present within the C3-4 interspace. Additional moderate  cervical spondylolysis at C5-6 and C6-7. Multilevel facet arthrosis, most notable at  C3-4 on the left and C5-6 on the right. Upper chest: Loculated collection partially visualized about the right shoulder (series 4, image 86), indeterminate, not well assessed on this examination. Visualized upper chest demonstrates no other acute finding. Mild atelectatic changes present at the left lung apex. Visualized lungs are otherwise clear. Other: None. IMPRESSION: 1. No acute abnormality within the cervical spine. 2. Postoperative changes from recent ACDF at Y2-4 without complication. Prevertebral swelling and effusion with soft tissue emphysema felt to be consistent with normal expected postoperative changes. 3. Additional moderate degenerative spondylolysis at C5-6 and C6-7, with multilevel facet arthrosis as above. 4. Partially visualized loculated hypodense collection about the right shoulder as above, indeterminate. Further evaluation with dedicated plain film radiography and/or cross-sectional imaging suggested as clinically warranted. Electronically Signed   By: Jeannine Boga M.D.   On: 11/28/2018 23:09   Ct Abdomen Pelvis W Contrast  Result Date: 11/28/2018 CLINICAL DATA:  Acute abdominal pain. Patient reports spinal fusion at [facility] two days ago. EXAM: CT ABDOMEN AND PELVIS WITH CONTRAST TECHNIQUE: Multidetector CT imaging of the abdomen and pelvis was performed using the standard protocol following bolus administration of intravenous contrast. CONTRAST:  13m ISOVUE-300 IOPAMIDOL (ISOVUE-300) INJECTION 61% COMPARISON:  Abdominal CTA 06/08/2018 FINDINGS: Lower chest: Mild hypoventilatory atelectasis. No pleural fluid or confluent airspace disease. There are coronary artery calcifications. Hepatobiliary: Mild hepatic steatosis without focal hepatic abnormality. Gallbladder distention without calcified gallstone, pericholecystic inflammation, or biliary dilatation. Pancreas: No ductal dilatation or  inflammation. Spleen: Normal in size without focal abnormality. Adrenals/Urinary Tract: Normal adrenal glands. No hydronephrosis or perinephric edema. Homogeneous renal enhancement with symmetric excretion on delayed phase imaging. Urinary bladder is distended without wall thickening. Stomach/Bowel: Colonic wall thickening with pericolonic edema extending from the mid transverse colon through the rectum. Liquid stool throughout the entire colon. No pneumatosis or perforation. Colonic diverticulosis without focal Peri diverticular inflammation. Appendix is not confidently visualized. No small bowel dilatation, obstruction, or inflammatory change. Stomach is unremarkable. Vascular/Lymphatic: Aortic and branch atherosclerosis. Calcifications at the origin of the celiac and SMA arteries. No evidence of acute vascular abnormality. Portal vein is patent. Reproductive: Status post hysterectomy. No adnexal masses. Other: Small volume of free fluid in the pelvis, pericolic gutters, and upper abdomen. No free air. No loculated fluid or intra-abdominal abscess. Tiny fat containing umbilical hernia. Musculoskeletal: Scoliosis and multilevel degenerative change throughout spine. There are no acute or suspicious osseous abnormalities. IMPRESSION: 1. Colitis extending from the mid transverse colon through the rectum, likely infectious or inflammatory. Consider C diff. The distribution is not consistent with ischemic colitis. 2. Colonic diverticulosis without evidence of focal diverticulitis. 3. Mild hepatic steatosis. 4.  Aortic Atherosclerosis (ICD10-I70.0). Electronically Signed   By: MKeith RakeM.D.   On: 11/28/2018 22:29   Dg Chest Portable 1 View  Result Date: 11/28/2018 CLINICAL DATA:  Altered mental status EXAM: PORTABLE CHEST 1 VIEW COMPARISON:  02/28/2017 FINDINGS: Hypoventilation with decreased lung volume and mild bibasilar atelectasis. Negative for heart failure or effusion. Negative for pneumonia. IMPRESSION:  Hypoventilation with bibasilar atelectasis. Electronically Signed   By: CFranchot GalloM.D.   On: 11/28/2018 18:29     Subjective: Pt feeling much better, tolerating soft diet.    Discharge Exam: Vitals:   12/02/18 0649 12/02/18 1418  BP: (!) 172/84 (!) 166/81  Pulse: 77 76  Resp:  17  Temp:  98 F (36.7 C)  SpO2:  98%   Vitals:   12/02/18 0011 12/02/18 0531 12/02/18 0649 12/02/18 1418  BP: (!) 169/81 (!) 196/99 (!) 172/84 (!) 166/81  Pulse: 77 75 77 76  Resp:  16  17  Temp:  98.4 F (36.9 C)  98 F (36.7 C)  TempSrc:  Oral  Oral  SpO2:  99%  98%  Weight:      Height:       General exam: Appears calm and comfortable, NAD.  Respiratory system: Breath sounds are diminished at bilateral bases Cardiovascular system: normal S1 & S2 heard. No JVD, murmurs, rubs, gallops or clicks. No pedal edema. Gastrointestinal system: Abdomen is nondistended, soft and nontender. No organomegaly or masses felt. Normal bowel sounds heard. Central nervous system: Alert and oriented. No focal neurological deficits. Extremities: Symmetric 4 x 5 power. Skin: No rashes, lesions or ulcers Psychiatry: Judgement and insight appear normal. Mood & affect appropriate.    The results of significant diagnostics from this hospitalization (including imaging, microbiology, ancillary and laboratory) are listed below for reference.     Microbiology: Recent Results (from the past 240 hour(s))  C difficile quick scan w PCR reflex     Status: None   Collection Time: 11/28/18  7:15 PM  Result Value Ref Range Status   C Diff antigen NEGATIVE NEGATIVE Final   C Diff toxin NEGATIVE NEGATIVE Final   C Diff interpretation No C. difficile detected.  Final    Comment: Performed at [facility], [address], [place], [place] [address]  Gastrointestinal Panel by PCR , Stool     Status: None   Collection Time: 11/28/18  7:15 PM  Result Value Ref Range Status   Campylobacter species NOT DETECTED NOT DETECTED Final    Plesimonas shigelloides NOT DETECTED NOT DETECTED Final   Salmonella species NOT DETECTED NOT DETECTED Final   Yersinia enterocolitica NOT DETECTED NOT DETECTED Final   Vibrio species NOT DETECTED NOT DETECTED Final   Vibrio cholerae NOT DETECTED NOT DETECTED Final   Enteroaggregative E coli (EAEC) NOT DETECTED NOT DETECTED Final   Enteropathogenic E coli (EPEC) NOT DETECTED NOT DETECTED Final   Enterotoxigenic E coli (ETEC) NOT DETECTED NOT DETECTED Final   Shiga like toxin producing E coli (STEC) NOT DETECTED NOT DETECTED Final   Shigella/Enteroinvasive E coli (EIEC) NOT DETECTED NOT DETECTED Final   Cryptosporidium NOT DETECTED NOT DETECTED Final   Cyclospora cayetanensis NOT DETECTED NOT DETECTED Final   Entamoeba histolytica NOT DETECTED NOT DETECTED Final   Giardia lamblia NOT DETECTED NOT DETECTED Final   Adenovirus F40/41 NOT DETECTED NOT DETECTED Final   Astrovirus NOT DETECTED NOT DETECTED Final   Norovirus GI/GII NOT DETECTED NOT DETECTED Final   Rotavirus A NOT DETECTED NOT DETECTED Final   Sapovirus (I, II, IV, and V) NOT DETECTED NOT DETECTED Final    Comment: Performed at [facility], [address] [place], [place] [address]  Blood Culture (routine x 2)     Status: None (Preliminary result)   Collection Time: 11/28/18  8:02 PM  Result Value Ref Range Status   Specimen Description BLOOD RIGHT HAND  Final   Special Requests   Final    BOTTLES DRAWN AEROBIC AND ANAEROBIC Blood Culture adequate volume   Culture   Final    NO GROWTH 4 DAYS Performed at [facility], [address] [place], [place] [address]    Report Status PENDING  Incomplete  Blood Culture (routine x 2)     Status: None (Preliminary result)   Collection Time: 11/28/18  8:04 PM  Result Value Ref Range  Status   Specimen Description BLOOD RIGHT ARM  Final   Special Requests AEROBIC BOTTLE ONLY Blood Culture adequate volume  Final   Culture   Final    NO GROWTH 4 DAYS Performed at [facility], [address] [place], [place] [address]    Report Status PENDING  Incomplete  Urine culture     Status: None   Collection Time: 11/28/18 10:23 PM  Result Value Ref Range Status   Specimen Description   Final    URINE, CATHETERIZED Performed at [facility], [address] [place], [place] [address]    Special Requests   Final    Normal Performed at [facility], [address] [place], [place] [address]    Culture   Final    NO GROWTH Performed at [facility], [place] [address] [place], [place] [address]    Report Status 11/30/2018 FINAL  Final     Labs: BNP (last 3 results) No results for input(s): BNP in the last 8760 hours. Basic Metabolic Panel: Recent Labs  Lab 11/28/18 2002 11/29/18 0514 11/30/18 0548 12/01/18 0657 12/02/18 0707  NA 121* 126* 128* 134* 133*  K 4.4 4.1 3.4* 3.7 3.9  CL 94* 97* 102 105 103  CO2 14* 18* 20* 23 23  GLUCOSE 134* 162* 117* 122* 117*  BUN 15 15 7* <5* <5*  CREATININE 0.97 0.93 0.69 0.58 0.64  CALCIUM 8.2* 7.7* 7.6* 8.1* 8.1*  MG  --   --   --  1.6* 1.7   Liver Function Tests: Recent Labs  Lab 11/28/18 2002 11/29/18 0514  AST 32 27  ALT 16 15  ALKPHOS 357* 261*  BILITOT 0.7 0.6  PROT 6.8 5.8*  ALBUMIN 3.4* 2.9*   Recent Labs  Lab 11/28/18 2002  LIPASE 68*   No results for input(s): AMMONIA in the last 168 hours. CBC: Recent Labs  Lab 11/28/18 2002 11/29/18 0514 11/29/18 1558 11/30/18 0548 12/02/18 0707  WBC 19.9* 19.9*  --  12.1* 8.0  NEUTROABS 17.5*  --   --  10.1*  --   HGB 16.0* 13.1 11.5* 9.9* 10.4*  HCT 51.8* 42.5 35.9* 30.8* 33.2*  MCV 85.9 85.7  --  83.5 86.7  PLT 300 343  --  256 284   Cardiac Enzymes: No results for input(s): CKTOTAL, CKMB, CKMBINDEX, TROPONINI in the last 168 hours. BNP: Invalid input(s): POCBNP CBG: Recent Labs  Lab 11/28/18 1808  GLUCAP 189*   D-Dimer No results for input(s): DDIMER in the last 72 hours. Hgb A1c No results for input(s): HGBA1C in the last  72 hours. Lipid Profile No results for input(s): CHOL, HDL, LDLCALC, TRIG, CHOLHDL, LDLDIRECT in the last 72 hours. Thyroid function studies No results for input(s): TSH, T4TOTAL, T3FREE, THYROIDAB in the last 72 hours.  Invalid input(s): FREET3 Anemia work up No results for input(s): VITAMINB12, FOLATE, FERRITIN, TIBC, IRON, RETICCTPCT in the last 72 hours. Urinalysis    Component Value Date/Time   COLORURINE YELLOW 11/28/2018 2223   APPEARANCEUR CLEAR 11/28/2018 2223   LABSPEC 1.011 11/28/2018 2223   PHURINE 6.0 11/28/2018 2223   GLUCOSEU NEGATIVE 11/28/2018 2223   HGBUR NEGATIVE 11/28/2018 2223   BILIRUBINUR NEGATIVE 11/28/2018 2223   KETONESUR NEGATIVE 11/28/2018 2223   PROTEINUR NEGATIVE 11/28/2018 2223   UROBILINOGEN 0.2 05/27/2010 0005   NITRITE NEGATIVE 11/28/2018 2223   LEUKOCYTESUR NEGATIVE 11/28/2018 2223   Sepsis Labs Invalid input(s): PROCALCITONIN,  WBC,  LACTICIDVEN Microbiology Recent Results (from the past  240 hour(s))  C difficile quick scan w PCR reflex     Status: None   Collection Time: 11/28/18  7:15 PM  Result Value Ref Range Status   C Diff antigen NEGATIVE NEGATIVE Final   C Diff toxin NEGATIVE NEGATIVE Final   C Diff interpretation No C. difficile detected.  Final    Comment: Performed at [facility], [address] [place], [place] [address]  Gastrointestinal Panel by PCR , Stool     Status: None   Collection Time: 11/28/18  7:15 PM  Result Value Ref Range Status   Campylobacter species NOT DETECTED NOT DETECTED Final   Plesimonas shigelloides NOT DETECTED NOT DETECTED Final   Salmonella species NOT DETECTED NOT DETECTED Final   Yersinia enterocolitica NOT DETECTED NOT DETECTED Final   Vibrio species NOT DETECTED NOT DETECTED Final   Vibrio cholerae NOT DETECTED NOT DETECTED Final   Enteroaggregative E coli (EAEC) NOT DETECTED NOT DETECTED Final   Enteropathogenic E coli (EPEC) NOT DETECTED NOT DETECTED Final   Enterotoxigenic E coli (ETEC)  NOT DETECTED NOT DETECTED Final   Shiga like toxin producing E coli (STEC) NOT DETECTED NOT DETECTED Final   Shigella/Enteroinvasive E coli (EIEC) NOT DETECTED NOT DETECTED Final   Cryptosporidium NOT DETECTED NOT DETECTED Final   Cyclospora cayetanensis NOT DETECTED NOT DETECTED Final   Entamoeba histolytica NOT DETECTED NOT DETECTED Final   Giardia lamblia NOT DETECTED NOT DETECTED Final   Adenovirus F40/41 NOT DETECTED NOT DETECTED Final   Astrovirus NOT DETECTED NOT DETECTED Final   Norovirus GI/GII NOT DETECTED NOT DETECTED Final   Rotavirus A NOT DETECTED NOT DETECTED Final   Sapovirus (I, II, IV, and V) NOT DETECTED NOT DETECTED Final    Comment: Performed at [facility], [address] [place], [place] [address]  Blood Culture (routine x 2)     Status: None (Preliminary result)   Collection Time: 11/28/18  8:02 PM  Result Value Ref Range Status   Specimen Description BLOOD RIGHT HAND  Final   Special Requests   Final    BOTTLES DRAWN AEROBIC AND ANAEROBIC Blood Culture adequate volume   Culture   Final    NO GROWTH 4 DAYS Performed at [facility], [address] [place], [place] [address]    Report Status PENDING  Incomplete  Blood Culture (routine x 2)     Status: None (Preliminary result)   Collection Time: 11/28/18  8:04 PM  Result Value Ref Range Status   Specimen Description BLOOD RIGHT ARM  Final   Special Requests AEROBIC BOTTLE ONLY Blood Culture adequate volume  Final   Culture   Final    NO GROWTH 4 DAYS Performed at [facility], [address] [place], [place] [address]    Report Status PENDING  Incomplete  Urine culture     Status: None   Collection Time: 11/28/18 10:23 PM  Result Value Ref Range Status   Specimen Description   Final    URINE, CATHETERIZED Performed at [facility], [address], [place], [place] [address]    Special Requests   Final    Normal Performed at [facility], [address], [place], [place] [address]     Culture   Final    NO GROWTH Performed at [facility], [place], [place], [place] [address]    Report Status 11/30/2018 FINAL  Final   Time coordinating discharge:  35 minutes  SIGNED:  Irwin Brakeman, MD  [facility] 12/02/2018, 3:40 PM If 7PM-7AM, please contact night-coverage

## 2018-12-02 NOTE — Discharge Instructions (Signed)
Soft Diet Recommended for at least 1 week.  Avoid NSAIDS medications for at least 1 week.  Please follow up with Dr. Orvan Falconer office for further tests.  Seek medical care or return to ER if symptoms come back, worsen or new problems develop.

## 2018-12-02 NOTE — Progress Notes (Signed)
  Subjective:  Patient has no complaints.  She denies abdominal pain.  She says she had 4 bowel movements yesterday.  2 or 3 or during the night.  She is not passing any blood.  Stool is loose.  She had another bowel movement this morning it was small volume and mushy.  Objective: Blood pressure (!) 172/84, pulse 77, temperature 98.4 F (36.9 C), temperature source Oral, resp. rate 16, height 5' 5"  (1.651 m), weight 81.6 kg, SpO2 99 %. Patient is alert and in no acute distress. Abdomen is full.  Bowel sounds are normal.  Abdomen is soft.  Mild tenderness noted at left mid abdomen.  No organomegaly or masses..  Labs/studies Results:   CBC Latest Ref Rng & Units 12/02/2018 11/30/2018 11/29/2018  WBC 4.0 - 10.5 K/uL 8.0 12.1(H) -  Hemoglobin 12.0 - 15.0 g/dL 10.4(L) 9.9(L) 11.5(L)  Hematocrit 36.0 - 46.0 % 33.2(L) 30.8(L) 35.9(L)  Platelets 150 - 400 K/uL 284 256 -    CMP Latest Ref Rng & Units 12/02/2018 12/01/2018 11/30/2018  Glucose 70 - 99 mg/dL 117(H) 122(H) 117(H)  BUN 8 - 23 mg/dL <5(L) <5(L) 7(L)  Creatinine 0.44 - 1.00 mg/dL 0.64 0.58 0.69  Sodium 135 - 145 mmol/L 133(L) 134(L) 128(L)  Potassium 3.5 - 5.1 mmol/L 3.9 3.7 3.4(L)  Chloride 98 - 111 mmol/L 103 105 102  CO2 22 - 32 mmol/L 23 23 20(L)  Calcium 8.9 - 10.3 mg/dL 8.1(L) 8.1(L) 7.6(L)  Total Protein 6.5 - 8.1 g/dL - - -  Total Bilirubin 0.3 - 1.2 mg/dL - - -  Alkaline Phos 38 - 126 U/L - - -  AST 15 - 41 U/L - - -  ALT 0 - 44 U/L - - -    Hepatic Function Latest Ref Rng & Units 11/29/2018 11/28/2018 09/01/2018  Total Protein 6.5 - 8.1 g/dL 5.8(L) 6.8 7.1  Albumin 3.5 - 5.0 g/dL 2.9(L) 3.4(L) 3.8  AST 15 - 41 U/L 27 32 22  ALT 0 - 44 U/L 15 16 21   Alk Phosphatase 38 - 126 U/L 261(H) 357(H) 46  Total Bilirubin 0.3 - 1.2 mg/dL 0.6 0.7 0.4  Bilirubin, Direct 0.0 - 0.3 mg/dL - - -     Assessment:  #1.  Acute left-sided colitis.  Patient's presentation typical of ischemic colitis.  CT findings are also consistent with this  diagnosis.  Stool testing has been negative.  Patient had colonoscopy in September last year for hematochezia revealing diverticulosis internal hemorrhoids and a small polyp.  Abdominal pain has resolved and abdominal exam is benign.  She is not passing blood anymore.  Leukocytosis has resolved.  #2.  Anemia secondary to GI bleed.  Hemoglobin on admission was 16 g.  Post hydration dropped to 13.1 g and was 9.9 g 2 days ago.  Now it is back up.   Recommendations:  Continue Cipro and metronidazole for another 4 days. CTA abdomen on an outpatient basis.  Dr. Roseanne Kaufman office will arrange Unless she has problems with her evening meal she should be able to go home today.

## 2018-12-02 NOTE — Progress Notes (Signed)
Patient states understanding of discharge instructions, prescriptions given

## 2018-12-03 LAB — CULTURE, BLOOD (ROUTINE X 2)
CULTURE: NO GROWTH
Culture: NO GROWTH
Special Requests: ADEQUATE
Special Requests: ADEQUATE

## 2018-12-04 ENCOUNTER — Encounter: Payer: Self-pay | Admitting: Internal Medicine

## 2018-12-07 DIAGNOSIS — R55 Syncope and collapse: Secondary | ICD-10-CM | POA: Diagnosis not present

## 2018-12-07 DIAGNOSIS — K529 Noninfective gastroenteritis and colitis, unspecified: Secondary | ICD-10-CM | POA: Diagnosis not present

## 2018-12-07 DIAGNOSIS — E538 Deficiency of other specified B group vitamins: Secondary | ICD-10-CM | POA: Diagnosis not present

## 2018-12-07 DIAGNOSIS — I10 Essential (primary) hypertension: Secondary | ICD-10-CM | POA: Diagnosis not present

## 2018-12-07 DIAGNOSIS — E6609 Other obesity due to excess calories: Secondary | ICD-10-CM | POA: Diagnosis not present

## 2018-12-07 DIAGNOSIS — Z6830 Body mass index (BMI) 30.0-30.9, adult: Secondary | ICD-10-CM | POA: Diagnosis not present

## 2018-12-07 DIAGNOSIS — Z1389 Encounter for screening for other disorder: Secondary | ICD-10-CM | POA: Diagnosis not present

## 2018-12-07 DIAGNOSIS — K55039 Acute (reversible) ischemia of large intestine, extent unspecified: Secondary | ICD-10-CM | POA: Diagnosis not present

## 2018-12-07 DIAGNOSIS — E871 Hypo-osmolality and hyponatremia: Secondary | ICD-10-CM | POA: Diagnosis not present

## 2018-12-07 DIAGNOSIS — T50905A Adverse effect of unspecified drugs, medicaments and biological substances, initial encounter: Secondary | ICD-10-CM | POA: Diagnosis not present

## 2018-12-11 ENCOUNTER — Encounter: Payer: Self-pay | Admitting: Nurse Practitioner

## 2018-12-11 ENCOUNTER — Ambulatory Visit (INDEPENDENT_AMBULATORY_CARE_PROVIDER_SITE_OTHER): Payer: Medicare Other | Admitting: Nurse Practitioner

## 2018-12-11 VITALS — BP 119/81 | HR 76 | Temp 97.0°F | Ht 64.0 in | Wt 175.0 lb

## 2018-12-11 DIAGNOSIS — K625 Hemorrhage of anus and rectum: Secondary | ICD-10-CM | POA: Diagnosis not present

## 2018-12-11 DIAGNOSIS — R1032 Left lower quadrant pain: Secondary | ICD-10-CM | POA: Diagnosis not present

## 2018-12-11 DIAGNOSIS — K529 Noninfective gastroenteritis and colitis, unspecified: Secondary | ICD-10-CM

## 2018-12-11 DIAGNOSIS — R197 Diarrhea, unspecified: Secondary | ICD-10-CM | POA: Diagnosis not present

## 2018-12-11 LAB — CBC WITH DIFFERENTIAL/PLATELET
Absolute Monocytes: 705 cells/uL (ref 200–950)
Basophils Absolute: 98 cells/uL (ref 0–200)
Basophils Relative: 1.2 %
EOS PCT: 1.3 %
Eosinophils Absolute: 107 cells/uL (ref 15–500)
HCT: 35.2 % (ref 35.0–45.0)
Hemoglobin: 11.5 g/dL — ABNORMAL LOW (ref 11.7–15.5)
Lymphs Abs: 2124 cells/uL (ref 850–3900)
MCH: 27.1 pg (ref 27.0–33.0)
MCHC: 32.7 g/dL (ref 32.0–36.0)
MCV: 82.8 fL (ref 80.0–100.0)
MPV: 8.9 fL (ref 7.5–12.5)
Monocytes Relative: 8.6 %
NEUTROS ABS: 5166 {cells}/uL (ref 1500–7800)
Neutrophils Relative %: 63 %
Platelets: 362 10*3/uL (ref 140–400)
RBC: 4.25 10*6/uL (ref 3.80–5.10)
RDW: 14.5 % (ref 11.0–15.0)
Total Lymphocyte: 25.9 %
WBC: 8.2 10*3/uL (ref 3.8–10.8)

## 2018-12-11 NOTE — Progress Notes (Signed)
CC'D TO PCP °

## 2018-12-11 NOTE — Patient Instructions (Signed)
Your health issues we discussed today were:   Colon inflammation (colitis) with diarrhea and abdominal pain: 1. I am glad you are feeling better. 2. We will check a special CT to evaluate the blood flow to your colon. 3. We will call you with results when we receive these. 4. Call us if you have any worsening symptoms before then  Bloody stools: 1. It appears the blood has stopped 2. You did become slightly anemic in the hospital due to the blood loss.  I will recheck your blood cell counts today.  We will call you with results. 3. Call us for any further bleeding that she noticed.  Overall I recommend:  1. Follow-up in 2 months. 2. Call us if you have any questions or concerns  At [facility] we value your feedback. You may receive a survey about your visit today. Please share your experience as we strive to create trusting relationships with our patients to provide genuine, compassionate, quality care.  We appreciate your understanding and patience as we review any laboratory studies, imaging, and other diagnostic tests that are ordered as we care for you. Our office policy is 5 business days for review of these results, and any emergent or urgent results are addressed in a timely manner for your best interest. If you do not hear from our office in 1 week, please contact us.   We also encourage the use of MyChart, which contains your medical information for your review as well. If you are not enrolled in this feature, an access code is on this after visit summary for your convenience. Thank you for allowing [place] to be involved in your care.  It was great to see you today!  I hope you have a great day!!

## 2018-12-11 NOTE — Addendum Note (Signed)
Addended by: Gordy Levan, ERIC A on: 12/11/2018 02:07 PM   Modules accepted: Orders

## 2018-12-11 NOTE — Progress Notes (Signed)
Referring Provider: [name], MD Primary Care Physician:  [name], MD Primary GI:  Dr. Gala [place]  Chief Complaint  Patient presents with  . Diarrhea    episode once a day    HPI:   Brittany Goodman is a 73 y.o. female who presents for hospital follow-up.  The patient was admitted to any pain hospital from 11/28/2018 through 12/02/2018.  Recent cervical spine surgery at [facility].  She was taking pain medications sparingly and improving.  However, the patient began having weakness and fell into a wall in the bathroom but did not hit the floor.  No syncope.  Also complaining of abdominal cramping.  Multiple episodes of diarrhea in the ER and mental status improved after Narcan.  CT showed inflammation of the transverse colon to rectum.  C. difficile PCR negative.  2 episodes of vomiting.  She was admitted and started on Cipro and Flagyl for likely infectious colitis.  GI pathogen panel was also subsequently negative.  She was having bloody bowel movements.  She declined any transfusions due to religious beliefs.  During the hospitalization her colitis was deemed infectious versus ischemia/inflammatory.  Recommended she avoid NSAIDs.  Continue Cipro and Flagyl for 4 additional days after discharge and she was discharged after improvement.  Recommended outpatient CTA of the abdomen and continue PPI.  Query ischemic colitis with episode of hypotension brought on by likely pain medication.  Most recent CBC completed 9 days ago and found to be 10.4.  Today she states she's doing better. Diarrhea is improved, recently having 1 diarrhea stool a day and none yet today. Denies any further hematochezia. No melena. Did take a little more pain medication after discharge but not dizziness and weakness with that. Denies N/V.Did have some bilateral lower abdomina/flank pain this morning before eating, lasted 30 mins and self-resolved. No other abdominal pain. Denies chest pain, dyspnea, dizziness,  lightheadedness, syncope, near syncope. Denies any other upper or lower GI symptoms.  Past Medical History:  Diagnosis Date  . Arthritis   . Depression   . Diabetes mellitus type II   . Diverticulosis   . GERD (gastroesophageal reflux disease)   . Hyperlipidemia   . Hypertension   . Hypothyroidism   . Sepsis ([place])    2011, Escherichia coli pyelonephritis  . Sleep apnea    Does not use CPAP. Cannot tolerate  . Spinal stenosis   . Stroke [facility])    no deficits    Past Surgical History:  Procedure Laterality Date  . BACK SURGERY    . CARPAL TUNNEL RELEASE Right   . COLONOSCOPY N/A 05/29/2013   Focal left colonic inflammation, likely remnant of recent bout of ischemic or segmental colitis, s/p biopsy. pancolonic diverticulosis. Due for surveillance in 5 years  . COLONOSCOPY WITH PROPOFOL N/A 07/26/2018   Procedure: COLONOSCOPY WITH PROPOFOL;  Surgeon: Daneil Dolin, MD;  Location: [facility];  Service: Endoscopy;  Laterality: N/A;  8:30am  . ESOPHAGOGASTRODUODENOSCOPY (EGD) WITH ESOPHAGEAL DILATION N/A 05/29/2013   normal appearing patent tubular esophagus, small hiatal hernia, multiple antral erosions. no ulcer. normlal duodenum. empiric dilation  . ILIOTIBIAL BAND RELEASE Left   . POLYPECTOMY  07/26/2018   Procedure: POLYPECTOMY;  Surgeon: Daneil Dolin, MD;  Location: [facility];  Service: Endoscopy;;  hepatic flexure polyp  . TOTAL ABDOMINAL HYSTERECTOMY      Current Outpatient Medications  Medication Sig Dispense Refill  . acetaminophen (TYLENOL) 500 MG tablet Take 1,000 mg by mouth  2 (two) times daily as needed for mild pain or moderate pain.     . Ascorbic Acid (VITAMIN C) 1000 MG tablet Take 1,000-2,000 mg by mouth daily.     Marland Kitchen atenolol (TENORMIN) 100 MG tablet Take 100 mg by mouth every morning.     . Biotin 5000 MCG TABS Take 1 tablet by mouth daily.    . Cholecalciferol (VITAMIN D) 125 MCG (5000 UT) CAPS Take 1 capsule by mouth daily.     . citalopram (CELEXA) 20 MG  tablet Take 20 mg by mouth every other day.     . cyclobenzaprine (FLEXERIL) 10 MG tablet Take 10 mg by mouth 2 (two) times daily as needed for muscle spasms.     . folic acid (FOLVITE) 633 MCG tablet Take 800 mcg by mouth daily.     Marland Kitchen glimepiride (AMARYL) 1 MG tablet Take 1 mg by mouth daily before breakfast.     . HYDROcodone-acetaminophen (NORCO/VICODIN) 5-325 MG tablet Take 1 tablet by mouth every 4 (four) hours as needed for moderate pain.   0  . levothyroxine (SYNTHROID, LEVOTHROID) 88 MCG tablet Take 88 mcg by mouth daily before breakfast.     . lisinopril (PRINIVIL,ZESTRIL) 10 MG tablet Take 1 tablet (10 mg total) by mouth daily for 30 days. 30 tablet 0  . loratadine (CLARITIN) 10 MG tablet Take 10 mg by mouth daily.    . metFORMIN (GLUCOPHAGE) 1000 MG tablet Take 1,000 mg by mouth 2 (two) times daily with a meal.     . NONFORMULARY OR COMPOUNDED ITEM Apply 1 application topically 3 (three) times daily as needed (Pain). Diclofenac, Baclofen, Gabapentin, Lidocaine, Menthol - Compounded at [facility]    . omeprazole (PRILOSEC) 20 MG capsule Take 20 mg by mouth daily.     . polyethylene glycol (MIRALAX / GLYCOLAX) packet Take 17 g by mouth daily as needed for moderate constipation or severe constipation. 30 each 0  . vitamin B-12 (CYANOCOBALAMIN) 1000 MCG tablet Take 1,000 mcg by mouth daily.     No current facility-administered medications for this visit.     Allergies as of 12/11/2018 - Review Complete 12/11/2018  Allergen Reaction Noted  . Celecoxib Swelling 11/28/2018  . Codeine Other (See Comments) 08/04/2009  . Dilaudid [hydromorphone hcl] Other (See Comments) 05/06/2013  . Morphine Nausea And Vomiting 08/04/2009  . Statins  11/28/2018  . Tetracycline Nausea And Vomiting 08/04/2009    Family History  Problem Relation Age of Onset  . Heart disease Mother        MI  . Heart disease Father   . Heart attack Father 2  . Heart disease Brother   . Cancer Brother         ADRENAL GLAND  . Colon polyps Brother   . Liver cancer Brother 37       deceased , ?metastatic cancer?   . Heart disease Maternal Grandfather        MI  . Cancer Paternal Grandfather        LUNG  . Colon cancer Neg Hx     Social History   Socioeconomic History  . Marital status: Widowed    Spouse name: Not on file  . Number of children: 2  . Years of education: GED  . Highest education level: Not on file  Occupational History  . 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 Financial resource strain: Not on file  . Food insecurity:  Worry: Not on file    Inability: Not on file  . Transportation needs:    Medical: Not on file    Non-medical: Not on file  Tobacco Use  . Smoking status: Former Smoker    Packs/day: 2.00    Years: 13.00    Pack years: 26.00    Last attempt to quit: 07/19/1966    Years since quitting: 52.4  . Smokeless tobacco: Former Systems developer    Types: Chew  Substance and Sexual Activity  . Alcohol use: No  . Drug use: No  . Sexual activity: Not Currently    Birth control/protection: Surgical  Lifestyle  . Physical activity:    Days per week: Not on file    Minutes per session: Not on file  . Stress: Not on file  Relationships  . Social connections:    Talks on phone: Not on file    Gets together: Not on file    Attends religious service: Not on file    Active member of club or organization: Not on file    Attends meetings of clubs or organizations: Not on file    Relationship status: Not on file  Other Topics Concern  . Not on file  Social History Narrative   Lives with son   Caffeine use: Coffee daily   Right handed   No regular exercise    Review of Systems: General: Negative for anorexia, weight loss, fever, chills, fatigue, weakness. ENT: Negative for hoarseness, difficulty swallowing. CV: Negative for chest pain, angina, palpitations, peripheral edema.  Respiratory: Negative for dyspnea at rest, cough,  sputum, wheezing.  GI: See history of present illness. Endo: Negative for unusual weight change.  Heme: Negative for bruising or bleeding. Allergy: Negative for rash or hives.   Physical Exam: BP 119/81   Pulse 76   Temp (!) 97 F (36.1 C) (Oral)   Ht 5' 4"  (1.626 m)   Wt 175 lb (79.4 kg)   BMI 30.04 kg/m  General:   Alert and oriented. Pleasant and cooperative. Well-nourished and well-developed.  Eyes:  Without icterus, sclera clear and conjunctiva pink.  Ears:  Normal auditory acuity. Cardiovascular:  S1, S2 present without murmurs appreciated. Extremities without clubbing or edema. Respiratory:  Clear to auscultation bilaterally. No wheezes, rales, or rhonchi. No distress.  Gastrointestinal:  +BS, soft, non-tender and non-distended. No HSM noted. No guarding or rebound. No masses appreciated.  Rectal:  Deferred  Musculoskalatal:  Symmetrical without gross deformities. Neurologic:  Alert and oriented x4;  grossly normal neurologically. Psych:  Alert and cooperative. Normal mood and affect. Heme/Lymph/Immune: No excessive bruising noted.    12/11/2018 1:52 PM   Disclaimer: This note was dictated with voice recognition software. Similar sounding words can inadvertently be transcribed and may not be corrected upon review.

## 2018-12-11 NOTE — Assessment & Plan Note (Signed)
Rectal bleeding associated with colitis, likely ischemic colitis versus infectious colitis.  She did complete her antibiotics.  At this point we will check a CTA as per below.  She denies any further rectal bleeding.  Her symptoms are globally improved.  I will check a CBC for blood cell counts today.  Follow-up in 2 months.

## 2018-12-11 NOTE — Assessment & Plan Note (Signed)
When the patient presented to the emergency department she was having diarrhea which was grossly bloody.  She was having multiple stools a day, up to 8.  Her diarrhea significantly improved after completing antibiotics and with improvement in her vital signs specifically her blood pressure.  We will further evaluate for ischemic disease as per below.  She states her diarrhea is improved.  In the past several days she has had about 1 loose stool a day although she has not had one yet today.  Overall she feels better.  Follow-up in 2 months.

## 2018-12-11 NOTE — Assessment & Plan Note (Signed)
Abdominal pain significantly improved.  She did have a brief 30-minute episode this morning but it self resolved.  Her pain in the hospital was generalized and quite significant.  It was associated with diarrhea and bloody stools as well.  Likely ischemic colitis, as per above.  At this point we will just continue to watch her symptoms and further evaluation as per above.  Follow-up in 2 months.

## 2018-12-11 NOTE — Assessment & Plan Note (Signed)
The patient was admitted to the hospital recently proximately 2 days after cervical spine surgery.  She was hypotensive and had recently taken pain medication in addition to recent anesthesia and nave to opioids resulting in hypotension and altered mental status.  Her mental status improved with Narcan.  She was hypotensive for about the following day.  She had symptoms including generalized abdominal pain, diarrhea, frequent bloody stools.  CT was consistent with infectious versus ischemic/inflammatory colitis.  Recent colonoscopy makes inflammatory bowel disease much less likely.  Most consistent with ischemia.  This would make sense to have an acute episode with a drop in blood pressure.  At this point we will check a CTA of the abdomen and pelvis to evaluate her vasculature.  Further recommendations to follow.  Follow-up in 2 months.

## 2018-12-19 ENCOUNTER — Encounter: Payer: Self-pay | Admitting: Nurse Practitioner

## 2018-12-21 ENCOUNTER — Telehealth: Payer: Self-pay | Admitting: Internal Medicine

## 2018-12-21 NOTE — Telephone Encounter (Signed)
Pt notified of results

## 2018-12-21 NOTE — Telephone Encounter (Signed)
Pt was returning a call from yesterday. Please call [phone]

## 2018-12-25 ENCOUNTER — Ambulatory Visit (HOSPITAL_COMMUNITY): Payer: Medicare Other

## 2018-12-26 DIAGNOSIS — H6993 Unspecified Eustachian tube disorder, bilateral: Secondary | ICD-10-CM | POA: Diagnosis not present

## 2018-12-26 DIAGNOSIS — K219 Gastro-esophageal reflux disease without esophagitis: Secondary | ICD-10-CM | POA: Diagnosis not present

## 2018-12-26 DIAGNOSIS — R0981 Nasal congestion: Secondary | ICD-10-CM | POA: Diagnosis not present

## 2018-12-26 DIAGNOSIS — R05 Cough: Secondary | ICD-10-CM | POA: Diagnosis not present

## 2018-12-26 DIAGNOSIS — Z6829 Body mass index (BMI) 29.0-29.9, adult: Secondary | ICD-10-CM | POA: Diagnosis not present

## 2018-12-26 DIAGNOSIS — H9203 Otalgia, bilateral: Secondary | ICD-10-CM | POA: Diagnosis not present

## 2019-01-08 ENCOUNTER — Ambulatory Visit (HOSPITAL_COMMUNITY)
Admission: RE | Admit: 2019-01-08 | Discharge: 2019-01-08 | Disposition: A | Discharge status: Home / Self Care | Payer: Medicare Other | Source: Ambulatory Visit | Attending: Nurse Practitioner | Admitting: Nurse Practitioner

## 2019-01-08 DIAGNOSIS — R1084 Generalized abdominal pain: Secondary | ICD-10-CM | POA: Diagnosis not present

## 2019-01-08 DIAGNOSIS — K529 Noninfective gastroenteritis and colitis, unspecified: Secondary | ICD-10-CM | POA: Diagnosis not present

## 2019-01-08 DIAGNOSIS — R197 Diarrhea, unspecified: Secondary | ICD-10-CM | POA: Diagnosis not present

## 2019-01-08 DIAGNOSIS — R1032 Left lower quadrant pain: Secondary | ICD-10-CM | POA: Diagnosis not present

## 2019-01-08 DIAGNOSIS — K625 Hemorrhage of anus and rectum: Secondary | ICD-10-CM | POA: Diagnosis not present

## 2019-01-08 MED ORDER — IOPAMIDOL (ISOVUE-370) INJECTION 76%
100.0000 mL | Freq: Once | INTRAVENOUS | Status: AC | PRN
  Administered 2019-01-08: 100 mL via INTRAVENOUS

## 2019-01-17 ENCOUNTER — Other Ambulatory Visit: Payer: Medicare Other

## 2019-01-17 ENCOUNTER — Ambulatory Visit: Payer: Medicare Other | Admitting: Cardiothoracic Surgery

## 2019-01-17 DIAGNOSIS — M48061 Spinal stenosis, lumbar region without neurogenic claudication: Secondary | ICD-10-CM | POA: Diagnosis not present

## 2019-01-17 DIAGNOSIS — M4186 Other forms of scoliosis, lumbar region: Secondary | ICD-10-CM | POA: Diagnosis not present

## 2019-01-17 DIAGNOSIS — M5136 Other intervertebral disc degeneration, lumbar region: Secondary | ICD-10-CM | POA: Diagnosis not present

## 2019-01-21 NOTE — Progress Notes (Signed)
CC'D TO PCP °

## 2019-01-31 ENCOUNTER — Ambulatory Visit
Admission: RE | Admit: 2019-01-31 | Discharge: 2019-01-31 | Disposition: A | Discharge status: Home / Self Care | Payer: Medicare Other | Source: Ambulatory Visit | Attending: Cardiothoracic Surgery | Admitting: Cardiothoracic Surgery

## 2019-01-31 ENCOUNTER — Other Ambulatory Visit: Payer: Self-pay

## 2019-01-31 ENCOUNTER — Ambulatory Visit (INDEPENDENT_AMBULATORY_CARE_PROVIDER_SITE_OTHER): Payer: Medicare Other | Admitting: Cardiothoracic Surgery

## 2019-01-31 VITALS — BP 134/84 | HR 70 | Resp 20 | Ht 64.0 in | Wt 168.0 lb

## 2019-01-31 DIAGNOSIS — R911 Solitary pulmonary nodule: Secondary | ICD-10-CM | POA: Diagnosis not present

## 2019-01-31 DIAGNOSIS — R918 Other nonspecific abnormal finding of lung field: Secondary | ICD-10-CM | POA: Diagnosis not present

## 2019-01-31 NOTE — Progress Notes (Signed)
[address]       [place] [address]             [phone]                    Brittany Goodman [place] Medical Record #[id number] Date of Birth: [birth year]  Referring: [name], MD Primary Care: [name], MD Primary Cardiologist: No primary care provider on file.  Chief Complaint:    Chief Complaint  Patient presents with   Lung Lesion    3 month f/u with Chest CT    History of Present Illness:    Brittany Goodman 73 y.o. female is seen in the office  today for evaluation of left upper lobe lung abnormality.  Most recently the patient had a CT scan of the chest done on September 01, 2018.  This was precipitated by a visit to the emergency room when she noted extensive bruising along her left neck left anterior chest and down along the left side of her chest.  She denies any specific trauma.  She had been taking high-dose aspirin at.  This was treated without any specific intervention other than discontinuing the aspirin.  Incidentally a CT scan suggested an irregular mass in the left upper lobe.  She had a previous history of a left upper lobe mass that she says was followed for several years with CT scan.  On review of her history and films a CT scan from 2015 was located.  The report on that scan does not mention left upper lobe irregularity but on review of the film there is an area of irregular mass in the area of question that persists now.  It does appear slightly enlarged over 4 years.   Patient is a distant smoker having smoked for approximately 10 years and quitting in 1973.  She denies any history of other malignancy.   Since last seen the patient has had spine surgery, several days after spine surgery developed ischemic colitis.  Currently she is improving.    Current Activity/ Functional Status:  Patient is independent with mobility/ambulation, transfers, ADL's, IADL's.   Zubrod Score: At the time of surgery this patients most  appropriate activity status/level should be described as: []     0    Normal activity, no symptoms [x]     1    Restricted in physical strenuous activity but ambulatory, able to do out light work []     2    Ambulatory and capable of self care, unable to do work activities, up and about               >50 % of waking hours                              []     3    Only limited self care, in bed greater than 50% of waking hours []     4    Completely disabled, no self care, confined to bed or chair []     5    Moribund   Past Medical History:  Diagnosis Date   Arthritis    Depression    Diabetes mellitus type II    Diverticulosis    GERD (gastroesophageal reflux disease)    Hyperlipidemia    Hypertension    Hypothyroidism    Sepsis ([place])    2011, Escherichia coli pyelonephritis  Sleep apnea    Does not use CPAP. Cannot tolerate   Spinal stenosis    Stroke ([place])    no deficits    Past Surgical History:  Procedure Laterality Date   BACK SURGERY     CARPAL TUNNEL RELEASE Right    COLONOSCOPY N/A 05/29/2013   Focal left colonic inflammation, likely remnant of recent bout of ischemic or segmental colitis, s/p biopsy. pancolonic diverticulosis. Due for surveillance in 5 years   COLONOSCOPY WITH PROPOFOL N/A 07/26/2018   Procedure: COLONOSCOPY WITH PROPOFOL;  Surgeon: Daneil Dolin, MD;  [facility]: [facility];  Service: Endoscopy;  Laterality: N/A;  8:30am   ESOPHAGOGASTRODUODENOSCOPY (EGD) WITH ESOPHAGEAL DILATION N/A 05/29/2013   normal appearing patent tubular esophagus, small hiatal hernia, multiple antral erosions. no ulcer. normlal duodenum. empiric dilation   ILIOTIBIAL BAND RELEASE Left    POLYPECTOMY  07/26/2018   Procedure: POLYPECTOMY;  Surgeon: Daneil Dolin, MD;  Location: [facility];  Service: Endoscopy;;  hepatic flexure polyp   TOTAL ABDOMINAL HYSTERECTOMY      Family History  Problem Relation Age of Onset   Heart disease Mother        MI    Heart disease Father    Heart attack Father 58   Heart disease Brother    Cancer Brother        ADRENAL GLAND   Colon polyps Brother    Liver cancer Brother 74       deceased , ?metastatic cancer?    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 30 tablet 0   acetaminophen (TYLENOL) 500 MG tablet Take 1,000 mg by mouth 2 (two) times daily as needed for mild pain or moderate pain.      Ascorbic Acid (VITAMIN C) 1000 MG tablet Take 1,000-2,000 mg by mouth daily.      atenolol (TENORMIN) 100 MG tablet Take 100 mg by mouth every morning.      Biotin 5000 MCG TABS Take 1 tablet by mouth daily.     Cholecalciferol (VITAMIN D) 125 MCG (5000 UT) CAPS Take 1 capsule by mouth daily.      citalopram (CELEXA) 20 MG tablet Take 20 mg by mouth every other day.      cyclobenzaprine (FLEXERIL) 10 MG tablet Take 10 mg by mouth 2 (two) times daily as needed for muscle spasms.      folic acid (FOLVITE)  800 MCG tablet Take 800 mcg by mouth daily.      glimepiride (AMARYL) 1 MG tablet Take 1 mg by mouth daily before breakfast.       HYDROcodone-acetaminophen (NORCO/VICODIN) 5-325 MG tablet Take 1 tablet by mouth every 4 (four) hours as needed for moderate pain.   0   levothyroxine (SYNTHROID, LEVOTHROID) 88 MCG tablet Take 88 mcg by mouth daily before breakfast.      loratadine (CLARITIN) 10 MG tablet Take 10 mg by mouth daily.     metFORMIN (GLUCOPHAGE) 1000 MG tablet Take 1,000 mg by mouth 2 (two) times daily with a meal.      NONFORMULARY OR COMPOUNDED ITEM Apply 1 application topically 3 (three) times daily as needed (Pain). Diclofenac, Baclofen, Gabapentin, Lidocaine, Menthol - Compounded at [facility]     omeprazole (PRILOSEC) 20 MG capsule Take 20 mg by mouth daily.      polyethylene glycol (MIRALAX / GLYCOLAX) packet Take 17 g by mouth daily as needed for moderate constipation or severe constipation. 30 each 0   vitamin B-12 (CYANOCOBALAMIN) 1000 MCG tablet Take 1,000 mcg by mouth daily.     No current facility-administered medications for this visit.     Although listed on the patient's medicine profile she notes that since the bleeding and bruising episode along the left neck and left chest she has not taken any other aspirin.  Pertinent items are noted in HPI.   Review of Systems:     Cardiac Review of Systems: [Y] = yes  or   [ N ] = no   Chest Pain [ N ]  Resting SOB [ N ] Exertional SOB  [Y]  Orthopnea Aqua.Slicker  ]   Pedal Edema [ N   Palpitations N ] Syncope  [ N]   Presyncope [ N]   General Review of Systems: [Y] = yes [  ]=no Constitional: recent weight change [  ];  Wt loss over the last 3 months [   ] anorexia [  ]; fatigue [  ]; nausea [  ]; night sweats [  ]; fever [  ]; or chills [  ];           Eye : blurred vision [  ]; diplopia [   ]; vision changes [  ];  Amaurosis fugax[  ]; Resp: cough [  ];  wheezing[  ];  hemoptysis[  ]; shortness of breath[  ]; paroxysmal nocturnal dyspnea[  ]; dyspnea on exertion[  ]; or orthopnea[  ];  GI:  gallstones[  ], vomiting[  ];  dysphagia[  ]; melena[   ];  hematochezia [  ]; heartburn[  ];   Hx of  Colonoscopy[  ]; GU: kidney stones [  ]; hematuria[  ];   dysuria [  ];  nocturia[  ];  history of     obstruction [  ]; urinary frequency [  ]             Skin: rash, swelling[  ];, hair loss[  ];  peripheral edema[  ];  or itching[  ]; Musculosketetal: myalgias[  ];  joint swelling[  ];  joint erythema[  ];  joint pain[  ];  back pain[y  ];  Heme/Lymph: bruising[ y ];  bleeding[y  ];  anemia[  ];  Neuro: TIA[  ];  headaches[  ];  stroke[  ];  vertigo[  ];  seizures[  ];   paresthesias[  ];  difficulty walking[y  ];  Psych:depression[  ]; anxiety[  ];  Endocrine: diabetes[  ];  thyroid dysfunction[  ];  Immunizations: Flu up to date Jazmín.Cullens  ]; Pneumococcal up to date [Y  ];  Other:     PHYSICAL EXAMINATION: BP 134/84    Pulse 70    Resp 20    Ht 5' 4"  (1.626 m)    Wt 168 lb (76.2 kg)    SpO2 98% Comment: RA   BMI 28.84 kg/m  General appearance: alert and cooperative Neck: no adenopathy, no carotid bruit, no JVD, supple, symmetrical, trachea midline and thyroid not enlarged, symmetric, no tenderness/mass/nodules Lymph nodes: Cervical, supraclavicular, and axillary nodes normal. Resp: clear to auscultation bilaterally Cardio: regular rate and rhythm, S1, S2 normal, no murmur, click, rub or gallop GI: soft, non-tender; bowel sounds normal; no masses,  no organomegaly Extremities: extremities normal, atraumatic, no cyanosis or edema and Homans sign is negative, no sign of DVT Neurologic: Grossly normal   Diagnostic Studies & Laboratory data:     Recent Radiology Findings:   Ct Chest Wo Contrast  Result Date: 01/31/2019 CLINICAL DATA:  Follow-up pulmonary nodule, left upper lobe EXAM: CT CHEST WITHOUT CONTRAST TECHNIQUE: Multidetector CT imaging of the chest was performed following the standard protocol without IV contrast. COMPARISON:  Chest radiograph, 11/28/2018, CT chest, 09/01/2018, 03/20/2014 FINDINGS: Cardiovascular: Coronary artery  calcifications and/or stents. Normal heart size. No pericardial effusion. Aortic atherosclerosis. Mediastinum/Nodes: No enlarged mediastinal, hilar, or axillary lymph nodes. Thyroid gland, trachea, and esophagus demonstrate no significant findings. Lungs/Pleura: Stable or minimally decreased size of an irregular nodule of the left pulmonary apex, measuring approximately 1.5 x 1.1 cm (series 8, image 22). New ground-glass opacities of the lateral right upper lobe, likely infectious or inflammatory and measuring up to 2.7 cm (series 8, image 67). No pleural effusion or pneumothorax. Upper Abdomen: No acute abnormality. Musculoskeletal: No chest wall mass or suspicious bone lesions identified. IMPRESSION: 1. Stable or minimally decreased size of an irregular nodule of the left pulmonary apex, measuring approximately 1.5 x 1.1 cm (series 8, image 22). 2. New ground-glass opacities of the lateral right upper lobe, likely infectious or inflammatory and measuring up to 2.7 cm (series 8, image 67). 3.  Coronary artery disease. Electronically Signed   By: Eddie Candle M.D.   On: 01/31/2019 15:23   Ct Angio Abd/pel W/ And/or W/o  Result Date: 01/08/2019 CLINICAL DATA:  Generalized abdominal pain, diarrhea and bloody stools. EXAM: CT ANGIOGRAPHY ABDOMEN AND PELVIS WITH CONTRAST TECHNIQUE: Multidetector CT imaging of the abdomen and pelvis was performed using the standard protocol during bolus administration of intravenous contrast. Multiplanar reconstructed images and MIPs were obtained and reviewed to evaluate the vascular anatomy. CONTRAST:  166m ISOVUE-370 IOPAMIDOL (ISOVUE-370) INJECTION 76% COMPARISON:  CT of the abdomen and pelvis with contrast on 11/28/2018 and CT of the chest on 09/01/2018. CTA of the abdomen and pelvis on 06/08/2018 FINDINGS: VASCULAR Aorta: Scattered atherosclerotic plaque and tortuosity without evidence of aneurysm or significant stenosis. Celiac: Heavily calcified plaque at the celiac origin  likely causes significant stenosis of greater than 70%. This appears stable since July, 2019. The celiac trunk itself remains open with normal patency of distal branch vessels. SMA: Calcified plaque at origin without evidence of significant stenosis. There is some additional noncalcified plaque in the proximal trunk of the superior mesenteric artery that causes approximately 40% non critical narrowing. This appears stable since the prior CTA in July, 2019. Renals: Bilateral single renal arteries present. Calcified and noncalcified plaque  in the proximal right renal artery does not cause significant stenosis. 50-60% narrowing at the origin of the left renal artery appears stable. IMA: Normally patent. Inflow: Scattered calcified plaque in bilateral iliac arteries without significant stenosis or evidence of aneurysmal disease. Proximal Outflow: Bilateral common femoral arteries and femoral bifurcations are normally patent. Veins: Venous phase imaging was also performed through the entire abdomen and pelvis demonstrating no evidence of mesenteric venous, portal vein or splenic vein thrombus. The IVC, iliac veins and common femoral veins are normally patent. Bilateral renal veins are widely patent. Review of the MIP images confirms the above findings. NON-VASCULAR Lower chest: Potentially new area sub-solid ground-glass opacity of the right lung which is not completely imaged and lies just superior to the minor fissure at the base of the right upper lobe and measures on the order of 1.8-1.9 cm. This is not completely evaluated. As this was not present 4 months ago, this may represent an area of atelectasis rather than a new pulmonary lesion. Focal area of pneumonia can not be excluded. Hepatobiliary: No focal liver abnormality is seen. No gallstones, gallbladder wall thickening, or biliary dilatation. Pancreas: Unremarkable. No pancreatic ductal dilatation or surrounding inflammatory changes. Spleen: Normal in size  without focal abnormality. Adrenals/Urinary Tract: Adrenal glands are unremarkable. Kidneys are normal, without renal calculi, focal lesion, or hydronephrosis. Bladder is unremarkable. Stomach/Bowel: Previously noted acute colitis involving a long segment of the colon from roughly the mid transverse to rectal levels has now resolved with no significant residual inflammation remaining. There is suggestion, however of potential distal rectal wall thickening. Definition of the rectum is not very clear by CT as it emerges with the vagina anteriorly. Rectal mass is not excluded and correlation is suggested with rectal exam. No evidence of bowel obstruction, ileus or free air. The appendix is unremarkable. Lymphatic: No enlarged lymph nodes identified in the abdomen or pelvis. Reproductive: Status post hysterectomy. No adnexal masses. The residual vaginal cavity appears irregularly thickened towards the pelvic floor. This appears more prominent compared to prior CT studies, including the study in July of 2019. Correlation suggested with gynecologic exam as a vaginal mass cannot be excluded. Other: No hernias are identified. No ascites or focal abscess identified. 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 No fractures or bony lesions identified. IMPRESSION: VASCULAR 1. 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 2. Stable mild stenosis of the proximal SMA. 3. 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 NON-VASCULAR 1. New area of pulmonary opacity measuring approximately 1.8-1.9 cm and having a ground-glass sub solid appearance in the inferior right upper lobe just above the minor fissure. This was not present by chest CT 4 months ago which would argue for a non neoplastic process such as atelectasis or infection. 2. Essential resolution of previously noted colitis involving a long  segment of the colon with potential residual thickening of the rectum. Evaluation of the rectum is limited by CT and correlation is suggested with rectal exam as underlying mass can not be excluded. 3. Irregular thickened appearance of the vaginal cavity which appears more prominent compared to prior CT. Recommend correlation with gynecologic exam as a vaginal mass cannot be excluded. Electronically Signed   By: Aletta Edouard M.D.   On: 01/08/2019 20:53    CLINICAL DATA:  Left shoulder hematoma.  No known injury.  EXAM: CT CHEST WITH CONTRAST  TECHNIQUE: Multidetector CT  imaging of the chest was performed during intravenous contrast administration.  CONTRAST:  77m OMNIPAQUE IOHEXOL 300 MG/ML  SOLN  COMPARISON:  CT chest 03/20/2014  FINDINGS: Cardiovascular: Normal heart size. Coronary arterial vascular calcifications. Thoracic aortic vascular calcifications.  Mediastinum/Nodes: No enlarged axillary, mediastinal or hilar lymphadenopathy. Normal appearance of the esophagus.  Lungs/Pleura: Central airways are patent. Dependent atelectasis/scarring within the bilateral lower lobes. 1.7 x 1.2 cm irregular sub solid nodule left lung apex (image 21; series 4), previously measuring 0.7 x 1.0 cm. No large area of pulmonary consolidation. No pleural effusion or pneumothorax.  Upper Abdomen: No acute process.  Musculoskeletal: Thoracic spine degenerative changes. No aggressive or acute appearing osseous lesions.  IMPRESSION: 1. There is a 1.7 cm enlarging sub solid nodule within the left lung apex. Possibility of slow growing malignancy should be considered. Recommend multi disciplinary thoracic oncology consultation. 2. No acute process within the chest. thank 3. Aortic Atherosclerosis (ICD10-I70.0).   Electronically Signed   By: DLovey NewcomerM.D.   On: 09/01/2018 20:33  Scan from 2015: no mention of left  upper lobe lesion that is present    I have independently  reviewed the above radiology studies  and reviewed the findings with the patient.  08/2018:   02/2014:   Recent Lab Findings: Lab Results  Component Value Date   WBC 8.2 12/11/2018   HGB 11.5 (L) 12/11/2018   HCT 35.2 12/11/2018   PLT 362 12/11/2018   GLUCOSE 117 (H) 12/02/2018   CHOL 254 (H) 02/28/2017   TRIG 344 (H) 02/28/2017   HDL 49 02/28/2017   LDLCALC 136 (H) 02/28/2017   ALT 15 11/29/2018   AST 27 11/29/2018   NA 133 (L) 12/02/2018   K 3.9 12/02/2018   CL 103 12/02/2018   CREATININE 0.64 12/02/2018   BUN <5 (L) 12/02/2018   CO2 23 12/02/2018   TSH 2.298 11/29/2018   INR 0.91 09/01/2018   HGBA1C 6.1 (H) 02/27/2017      Assessment / Plan:   Patient has a persistent parenchymal irregularity left upper lobe, that was present in 2015 and is incidentally noted again in 2019 when she obtain a CT scan for spontaneous bruising along the left neck and chest.  The area in question does appear to have enlarged very slightly over the 4 years.  The mass is indeterminate as if it is malignant or not but if it is it is changing slowly Follow-up CT scan today compared to October 2019 shows stable to slightly decrease in size of the left upper lobe nodule  I discussed with the patient obtaining a a follow-up CT scan in 4 months .  If we document continued steady enlargement will obtain a PET scan and needle biopsy.  The patient would be a poor candidate for surgical lung resection.    EGrace IsaacMD      [address] [place] [id number] [phone]   Beeper [phone] 01/31/2019 3:44 PM

## 2019-02-07 ENCOUNTER — Ambulatory Visit: Payer: Medicare Other | Admitting: Nurse Practitioner

## 2019-02-14 ENCOUNTER — Other Ambulatory Visit: Payer: Self-pay

## 2019-02-14 ENCOUNTER — Encounter (HOSPITAL_COMMUNITY): Payer: Self-pay

## 2019-02-14 ENCOUNTER — Inpatient Hospital Stay (HOSPITAL_COMMUNITY)
Admission: EM | Admit: 2019-02-14 | Discharge: 2019-02-15 | DRG: 064 | Disposition: A | Discharge status: Home / Self Care | Payer: Medicare Other | Attending: Internal Medicine | Admitting: Internal Medicine

## 2019-02-14 ENCOUNTER — Emergency Department (HOSPITAL_COMMUNITY): Payer: Medicare Other

## 2019-02-14 DIAGNOSIS — I63011 Cerebral infarction due to thrombosis of right vertebral artery: Secondary | ICD-10-CM

## 2019-02-14 DIAGNOSIS — F32A Depression, unspecified: Secondary | ICD-10-CM

## 2019-02-14 DIAGNOSIS — Z90710 Acquired absence of both cervix and uterus: Secondary | ICD-10-CM

## 2019-02-14 DIAGNOSIS — Z885 Allergy status to narcotic agent status: Secondary | ICD-10-CM

## 2019-02-14 DIAGNOSIS — I639 Cerebral infarction, unspecified: Principal | ICD-10-CM | POA: Diagnosis present

## 2019-02-14 DIAGNOSIS — IMO0002 Reserved for concepts with insufficient information to code with codable children: Secondary | ICD-10-CM

## 2019-02-14 DIAGNOSIS — E1165 Type 2 diabetes mellitus with hyperglycemia: Secondary | ICD-10-CM | POA: Diagnosis not present

## 2019-02-14 DIAGNOSIS — M4800 Spinal stenosis, site unspecified: Secondary | ICD-10-CM

## 2019-02-14 DIAGNOSIS — G4733 Obstructive sleep apnea (adult) (pediatric): Secondary | ICD-10-CM | POA: Diagnosis not present

## 2019-02-14 DIAGNOSIS — Z7984 Long term (current) use of oral hypoglycemic drugs: Secondary | ICD-10-CM

## 2019-02-14 DIAGNOSIS — Z79890 Hormone replacement therapy (postmenopausal): Secondary | ICD-10-CM

## 2019-02-14 DIAGNOSIS — E039 Hypothyroidism, unspecified: Secondary | ICD-10-CM | POA: Diagnosis present

## 2019-02-14 DIAGNOSIS — K5731 Diverticulosis of large intestine without perforation or abscess with bleeding: Secondary | ICD-10-CM | POA: Diagnosis present

## 2019-02-14 DIAGNOSIS — Z79899 Other long term (current) drug therapy: Secondary | ICD-10-CM

## 2019-02-14 DIAGNOSIS — R29702 NIHSS score 2: Secondary | ICD-10-CM | POA: Diagnosis present

## 2019-02-14 DIAGNOSIS — Z800 Family history of malignant neoplasm of digestive organs: Secondary | ICD-10-CM

## 2019-02-14 DIAGNOSIS — E785 Hyperlipidemia, unspecified: Secondary | ICD-10-CM | POA: Diagnosis present

## 2019-02-14 DIAGNOSIS — R4781 Slurred speech: Secondary | ICD-10-CM | POA: Diagnosis not present

## 2019-02-14 DIAGNOSIS — R4701 Aphasia: Secondary | ICD-10-CM | POA: Diagnosis not present

## 2019-02-14 DIAGNOSIS — G4730 Sleep apnea, unspecified: Secondary | ICD-10-CM | POA: Diagnosis present

## 2019-02-14 DIAGNOSIS — Z79891 Long term (current) use of opiate analgesic: Secondary | ICD-10-CM

## 2019-02-14 DIAGNOSIS — I679 Cerebrovascular disease, unspecified: Secondary | ICD-10-CM | POA: Diagnosis present

## 2019-02-14 DIAGNOSIS — F320 Major depressive disorder, single episode, mild: Secondary | ICD-10-CM | POA: Diagnosis not present

## 2019-02-14 DIAGNOSIS — F329 Major depressive disorder, single episode, unspecified: Secondary | ICD-10-CM | POA: Diagnosis present

## 2019-02-14 DIAGNOSIS — I10 Essential (primary) hypertension: Secondary | ICD-10-CM | POA: Diagnosis not present

## 2019-02-14 DIAGNOSIS — J323 Chronic sphenoidal sinusitis: Secondary | ICD-10-CM | POA: Diagnosis not present

## 2019-02-14 DIAGNOSIS — E119 Type 2 diabetes mellitus without complications: Secondary | ICD-10-CM | POA: Diagnosis present

## 2019-02-14 DIAGNOSIS — E118 Type 2 diabetes mellitus with unspecified complications: Secondary | ICD-10-CM

## 2019-02-14 DIAGNOSIS — K219 Gastro-esophageal reflux disease without esophagitis: Secondary | ICD-10-CM | POA: Diagnosis present

## 2019-02-14 DIAGNOSIS — Z8249 Family history of ischemic heart disease and other diseases of the circulatory system: Secondary | ICD-10-CM

## 2019-02-14 DIAGNOSIS — Z87891 Personal history of nicotine dependence: Secondary | ICD-10-CM

## 2019-02-14 DIAGNOSIS — Z8371 Family history of colonic polyps: Secondary | ICD-10-CM

## 2019-02-14 DIAGNOSIS — E1151 Type 2 diabetes mellitus with diabetic peripheral angiopathy without gangrene: Secondary | ICD-10-CM | POA: Diagnosis present

## 2019-02-14 DIAGNOSIS — Z886 Allergy status to analgesic agent status: Secondary | ICD-10-CM

## 2019-02-14 HISTORY — DX: Cerebral infarction, unspecified: I63.9

## 2019-02-14 HISTORY — DX: Reserved for concepts with insufficient information to code with codable children: IMO0002

## 2019-02-14 HISTORY — DX: Type 2 diabetes mellitus with hyperglycemia: E11.65

## 2019-02-14 HISTORY — DX: Obstructive sleep apnea (adult) (pediatric): G47.33

## 2019-02-14 HISTORY — DX: Spinal stenosis, site unspecified: M48.00

## 2019-02-14 HISTORY — DX: Depression, unspecified: F32.A

## 2019-02-14 HISTORY — DX: Type 2 diabetes mellitus with unspecified complications: E11.8

## 2019-02-14 LAB — COMPREHENSIVE METABOLIC PANEL
ALT: 13 U/L (ref 0–44)
AST: 17 U/L (ref 15–41)
Albumin: 4.4 g/dL (ref 3.5–5.0)
Alkaline Phosphatase: 51 U/L (ref 38–126)
Anion gap: 13 (ref 5–15)
BUN: 9 mg/dL (ref 8–23)
CHLORIDE: 91 mmol/L — AB (ref 98–111)
CO2: 21 mmol/L — AB (ref 22–32)
Calcium: 9.8 mg/dL (ref 8.9–10.3)
Creatinine, Ser: 0.67 mg/dL (ref 0.44–1.00)
GFR calc Af Amer: >60 mL/min (ref >60)
GFR calc non Af Amer: >60 mL/min (ref >60)
Glucose, Bld: 134 mg/dL — ABNORMAL HIGH (ref 70–99)
Potassium: 4 mmol/L (ref 3.5–5.1)
Sodium: 125 mmol/L — ABNORMAL LOW (ref 135–145)
Total Bilirubin: 0.4 mg/dL (ref 0.3–1.2)
Total Protein: 8 g/dL (ref 6.5–8.1)

## 2019-02-14 LAB — CBC
HCT: 40.7 % (ref 36.0–46.0)
Hemoglobin: 13.2 g/dL (ref 12.0–15.0)
MCH: 26.3 pg (ref 26.0–34.0)
MCHC: 32.4 g/dL (ref 30.0–36.0)
MCV: 81.1 fL (ref 80.0–100.0)
Platelets: 259 10*3/uL (ref 150–400)
RBC: 5.02 MIL/uL (ref 3.87–5.11)
RDW: 15.9 % — ABNORMAL HIGH (ref 11.5–15.5)
WBC: 9.4 10*3/uL (ref 4.0–10.5)
nRBC: 0 % (ref 0.0–0.2)

## 2019-02-14 LAB — DIFFERENTIAL
Abs Immature Granulocytes: 0.03 10*3/uL (ref 0.00–0.07)
BASOS ABS: 0 10*3/uL (ref 0.0–0.1)
BASOS PCT: 0 %
Eosinophils Absolute: 0.1 10*3/uL (ref 0.0–0.5)
Eosinophils Relative: 1 %
Immature Granulocytes: 0 %
Lymphocytes Relative: 38 %
Lymphs Abs: 3.6 10*3/uL (ref 0.7–4.0)
Monocytes Absolute: 0.9 10*3/uL (ref 0.1–1.0)
Monocytes Relative: 9 %
Neutro Abs: 4.8 10*3/uL (ref 1.7–7.7)
Neutrophils Relative %: 52 %

## 2019-02-14 LAB — PROTIME-INR
INR: 0.9 (ref 0.8–1.2)
Prothrombin Time: 12.3 seconds (ref 11.4–15.2)

## 2019-02-14 LAB — ETHANOL: Alcohol, Ethyl (B): <10 mg/dL (ref <10)

## 2019-02-14 LAB — CBG MONITORING, ED: Glucose-Capillary: 136 mg/dL — ABNORMAL HIGH (ref 70–99)

## 2019-02-14 LAB — APTT: aPTT: 27 seconds (ref 24–36)

## 2019-02-14 MED ORDER — ONDANSETRON HCL 4 MG/2ML IJ SOLN
4.0000 mg | Freq: Once | INTRAMUSCULAR | Status: AC
  Administered 2019-02-14: 4 mg via INTRAVENOUS
  Filled 2019-02-14: qty 2

## 2019-02-14 NOTE — ED Notes (Signed)
Pt headed to CT

## 2019-02-14 NOTE — ED Triage Notes (Signed)
Pt brought to ED by family with difficulty speaking. LKW [id number].

## 2019-02-14 NOTE — ED Notes (Signed)
Spoke with pt daughter in law via telephone to update on pt status and disposition. This number given to Dr Clydene Laming. [phone]

## 2019-02-14 NOTE — ED Provider Notes (Addendum)
[facility] EMERGENCY DEPARTMENT Provider Note   CSN: [id number] Arrival date & time: 02/14/19  2135  An emergency department physician performed an initial assessment on this suspected stroke patient at 2144.  History   Chief Complaint Chief Complaint  Patient presents with   Code Stroke    HPI Brittany Goodman is a 73 y.o. female.     Patient last seen normal at 5 PM.  Brought in by family for the inability to talk.  Patient was not ill earlier in the day.  Patient has a history significant for diabetes hyperlipidemia and hypertension.     Past Medical History:  Diagnosis Date   Arthritis    Depression    Diabetes mellitus type II    Diverticulosis    GERD (gastroesophageal reflux disease)    Hyperlipidemia    Hypertension    Hypothyroidism    Sepsis ([place])    2011, Escherichia coli pyelonephritis   Sleep apnea    Does not use CPAP. 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) 02/28/2017   Cerebrovascular disease 02/27/2017   RUQ pain 05/06/2013   Abdominal pain, epigastric 05/06/2013   Esophageal dysphagia 05/06/2013   GERD (gastroesophageal reflux disease) 05/06/2013   Family history of polyps in the colon 05/06/2013   DYSPNEA 09/01/2009   MIXED HYPERLIPIDEMIA 08/05/2009   HYPERTENSION, BENIGN ESSENTIAL 08/05/2009   PRECORDIAL PAIN 08/05/2009    Past Surgical History:  Procedure Laterality Date   BACK SURGERY     CARPAL TUNNEL RELEASE Right    COLONOSCOPY N/A  05/29/2013   Focal left colonic inflammation, likely remnant of recent bout of ischemic or segmental colitis, s/p biopsy. pancolonic diverticulosis. Due for surveillance in 5 years   COLONOSCOPY WITH PROPOFOL N/A 07/26/2018   Procedure: COLONOSCOPY WITH PROPOFOL;  Surgeon: Daneil Dolin, MD;  Location: [facility] ENDO SU[facility];  Service: Endoscopy;  Laterality: N/A;  8:30am   ESOPHAGOGASTRODUODENOSCOPY (EGD) WITH ESOPHAGEAL DILATION N/A 05/29/2013   normal appearing patent tubular esophagus, small hiatal hernia, multiple antral erosions. no ulcer. normlal duodenum. empiric dilation   ILIOTIBIAL BAND RELEASE Left    POLYPECTOMY  07/26/2018   Procedure: POLYPECTOMY;  Surgeon: Daneil Dolin, MD;  Location: [facility] ENDO SUITE;  Service: Endoscopy;;  hepatic flexure polyp   TOTAL ABDOMINAL HYSTERECTOMY       OB History   No obstetric history on file.      Home Medications    Prior to Admission medications   Medication Sig Start Date End Date Taking? Authorizing Provider  acetaminophen (TYLENOL) 500 MG tablet Take 1,000 mg by mouth 2 (two) times daily as needed for mild pain or moderate pain.     [provider]  Ascorbic Acid (VITAMIN C) 1000 MG tablet Take 1,000-2,000 mg by mouth daily.     [provider]  atenolol (TENORMIN) 100 MG tablet Take 100 mg by mouth every morning.     [provider]  Biotin 5000  MCG TABS Take 1 tablet by mouth daily.    [provider]  Cholecalciferol (VITAMIN D) 125 MCG (5000 UT) CAPS Take 1 capsule by mouth daily.     [provider]  citalopram (CELEXA) 20 MG tablet Take 20 mg by mouth every other day.     [provider]  cyclobenzaprine (FLEXERIL) 10 MG tablet Take 10 mg by mouth 2 (two) times daily as needed for muscle spasms.     [provider]  folic acid (FOLVITE) 335 MCG tablet Take 800 mcg by mouth daily.     [provider]  glimepiride (AMARYL) 1 MG tablet Take 1 mg by mouth daily before  breakfast.  04/23/13   [provider]  HYDROcodone-acetaminophen (NORCO/VICODIN) 5-325 MG tablet Take 1 tablet by mouth every 4 (four) hours as needed for moderate pain.  03/10/17   [provider]  levothyroxine (SYNTHROID, LEVOTHROID) 88 MCG tablet Take 88 mcg by mouth daily before breakfast.  01/18/17   [provider]  lisinopril (PRINIVIL,ZESTRIL) 10 MG tablet Take 1 tablet (10 mg total) by mouth daily for 30 days. 12/03/18 01/31/19  Johnson, Clanford L, MD  loratadine (CLARITIN) 10 MG tablet Take 10 mg by mouth daily.    [provider]  metFORMIN (GLUCOPHAGE) 1000 MG tablet Take 1,000 mg by mouth 2 (two) times daily with a meal.  12/01/16   [provider]  NONFORMULARY OR COMPOUNDED ITEM Apply 1 application topically 3 (three) times daily as needed (Pain). Diclofenac, Baclofen, Gabapentin, Lidocaine, Menthol - Compounded at [employer], Historical, MD  omeprazole (PRILOSEC) 20 MG capsule Take 20 mg by mouth daily.     [provider]  polyethylene glycol (MIRALAX / GLYCOLAX) packet Take 17 g by mouth daily as needed for moderate constipation or severe constipation. 12/02/18   Johnson, Clanford L, MD  vitamin B-12 (CYANOCOBALAMIN) 1000 MCG tablet Take 1,000 mcg by mouth daily.    [provider]    Family History Family History  Problem Relation Age of Onset   Heart disease Mother        MI   Heart disease Father    Heart attack Father 56   Heart disease Brother    Cancer Brother        ADRENAL GLAND   Colon polyps Brother    Liver cancer Brother 72       deceased , ?metastatic cancer?    Heart disease Maternal Grandfather        MI   Cancer Paternal Grandfather        LUNG   Colon cancer Neg Hx     Social History Social History   Tobacco Use   Smoking status: Former Smoker    Packs/day: 2.00    Years: 13.00    Pack years: 26.00    Last attempt to quit: 07/19/1966    Years since  quitting: 52.6   Smokeless tobacco: Former Systems developer    Types: Chew  Substance Use Topics   Alcohol use: No   Drug use: No     Allergies   Celecoxib; Codeine; Dilaudid [hydromorphone hcl]; Morphine; Statins; and Tetracycline   Review of Systems Review of Systems  Unable to perform ROS: Patient nonverbal     Physical Exam Updated Vital Signs BP (!) 203/91    Pulse 78    Temp 97.8 F (36.6 C) (Oral)    Resp 20    Ht 1.626 m (5' 4" )  Wt 76.2 kg    SpO2 100%    BMI 28.84 kg/m   Physical Exam Vitals signs and nursing note reviewed.  Constitutional:      General: She is not in acute distress.    Appearance: Normal appearance. She is well-developed.  HENT:     Head: Normocephalic and atraumatic.     Nose: No congestion.     Mouth/Throat:     Mouth: Mucous membranes are moist.  Eyes:     Extraocular Movements: Extraocular movements intact.     Conjunctiva/sclera: Conjunctivae normal.     Pupils: Pupils are equal, round, and reactive to light.  Neck:     Musculoskeletal: Normal range of motion and neck supple.  Cardiovascular:     Rate and Rhythm: Normal rate and regular rhythm.     Heart sounds: Normal heart sounds. No murmur.  Pulmonary:     Effort: Pulmonary effort is normal. No respiratory distress.     Breath sounds: Normal breath sounds.  Abdominal:     General: Bowel sounds are normal.     Palpations: Abdomen is soft.     Tenderness: There is no abdominal tenderness.  Musculoskeletal: Normal range of motion.        General: No swelling.  Skin:    General: Skin is warm and dry.     Capillary Refill: Capillary refill takes less than 2 seconds.  Neurological:     Mental Status: She is alert.     Cranial Nerves: Cranial nerve deficit present.     Sensory: No sensory deficit.     Motor: No weakness.     Comments: Aphasia not able to speak.  No motor weakness.  No obvious sensory weakness.  Just isolated aphasia.      ED Treatments / Results  Labs (all labs  ordered are listed, but only abnormal results are displayed) Labs Reviewed  CBC - Abnormal; Notable for the following components:      Result Value   RDW 15.9 (*)    All other components within normal limits  CBG MONITORING, ED - Abnormal; Notable for the following components:   Glucose-Capillary 136 (*)    All other components within normal limits  ETHANOL  PROTIME-INR  APTT  DIFFERENTIAL  COMPREHENSIVE METABOLIC PANEL  RAPID URINE DRUG SCREEN, HOSP PERFORMED  URINALYSIS, ROUTINE W REFLEX MICROSCOPIC    EKG None  Radiology Ct Head Code Stroke Wo Contrast  Result Date: 02/14/2019 CLINICAL DATA:  Code stroke. 73 year old female with sudden onset weakness and slurred speech. EXAM: CT HEAD WITHOUT CONTRAST TECHNIQUE: Contiguous axial images were obtained from the base of the skull through the vertex without intravenous contrast. COMPARISON:  11/28/2018 head CT. FINDINGS: Brain: Cerebral volume is within normal limits for age. No midline shift, ventriculomegaly, mass effect, evidence of mass lesion, intracranial hemorrhage or evidence of cortically based acute infarction. Gray-white matter differentiation is within normal limits throughout the brain. No cortical encephalomalacia identified. Vascular: Mild Calcified atherosclerosis at the skull base. No suspicious intracranial vascular hyperdensity. Skull: No acute osseous abnormality identified. Sinuses/Orbits: Chronic right sphenoid sinus disease. Other Visualized paranasal sinuses and mastoids are stable and well pneumatized. Other: Visualized orbits and scalp soft tissues are within normal limits. ASPECTS ([place] Stroke Program Early CT Score) - Ganglionic level infarction (caudate, lentiform nuclei, internal capsule, insula, M1-M3 cortex): 7 - Supraganglionic infarction (M4-M6 cortex): 3 Total score (0-10 with 10 being normal): 10 IMPRESSION: 1. Stable and normal for age noncontrast CT appearance of the brain.  ASPECTS is 10. 2. Chronic right  sphenoid sinuses. Study discussed by telephone with Dr. Fredia Sorrow on 02/14/2019 at 21:59 . Electronically Signed   By: Genevie Ann M.D.   On: 02/14/2019 21:59    Procedures Procedures (including critical care time)  CRITICAL CARE Performed by: Fredia Sorrow Total critical care time: 30 minutes Critical care time was exclusive of separately billable procedures and treating other patients. Critical care was necessary to treat or prevent imminent or life-threatening deterioration. Critical care was time spent personally by me on the following activities: development of treatment plan with patient and/or surrogate as well as nursing, discussions with consultants, evaluation of patient's response to treatment, examination of patient, obtaining history from patient or surrogate, ordering and performing treatments and interventions, ordering and review of laboratory studies, ordering and review of radiographic studies, pulse oximetry and re-evaluation of patient's condition.   Medications Ordered in ED Medications  ondansetron (ZOFRAN) injection 4 mg (4 mg Intravenous Given 02/14/19 2157)     Initial Impression / Assessment and Plan / ED Course  I have reviewed the triage vital signs and the nursing notes.  Pertinent labs & imaging results that were available during my care of the patient were reviewed by me and considered in my medical decision making (see chart for details).       Patient last seen normal at 5 PM.  Patient brought in by family members.  Patient not able to form any words.  Patient did vomit shortly after she got here.  Based on her presentation since symptoms were less than 6hours code stroke was called patient was right at the border and a little beyond the 4-1/2-hour mark.  Seen by tele-neurologist.  Head CT was negative.  When she came back from head CT aphasia started to resolve and patient went on to be able to formulate words pretty normally and hold a little bit of  conversation.  Again there was no other neuro deficits.  Patient had no motor weakness and no sensory deficit.  The tele-neurologist did not recommend CT angios or any large vessel studies.  Because it was an isolated aphasia and symptoms were improving.  Not able to get MRI at this time at night.  Recommended for admission by the hospitalist team MRI in the morning.  Patient initial blood pressures were around 720 systolic.  They have now come down to about 190 in the face of concern for stroke will not be lowering them.  During her admission in January patient was a DNR.   Final Clinical Impressions(s) / ED Diagnoses   Final diagnoses:  Aphasia  Cerebrovascular accident (CVA), unspecified mechanism [facility])    ED Discharge Orders    None       Fredia Sorrow, MD 02/14/19 2354    Fredia Sorrow, MD 02/14/19 2355

## 2019-02-14 NOTE — ED Notes (Signed)
Pt now able to recognize a watch but at first was not able

## 2019-02-14 NOTE — Consult Note (Signed)
[facility]rology Consult Services   Date of Service:   02/14/2019 21:54:35  Impression:     .  Small Vessel Infarct     .  Left Hemispheric Infarct     .  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 She has no motor or sensory deficit. With time rehabilitation she has a reasonable chance of functional recovery. She needs antiplatelet therapy and pass of hypertension for now but been better blood pressure control.  Mechanism of Stroke: Not Clear  Metrics: Last Known Well: 02/14/2019 17:00:00 [facility] Notification Time: 02/14/2019 21:54:35 Arrival Time: 02/14/2019 21:35:00 Stamp Time: 02/14/2019 21:54:35 Time First Login Attempt: 02/14/2019 22:00:10 Video Start Time: 02/14/2019 22:00:10  Symptoms: Slurred speech NIHSS Start Assessment Time: 02/14/2019 22:05:00 Patient is not a candidate for tPA. Patient was not deemed candidate for tPA thrombolytics because of Last Well Known Above 4.5 Hours. Video End Time: 02/14/2019 22:13:51  CT head showed no acute hemorrhage or acute core infarct. CT head was reviewed and results were: No evidence of acute ischemic change, hemorrhage, or mass lesion. MRI last year showed small vessel disease.  Clinical Presentation is not Suggestive of Large Vessel Occlusive Disease, Patient is not a Candidate for Thrombectomy  Radiologist was not called back for review of advanced imaging because Not indicated ED Physician notified of diagnostic impression and management plan on 02/14/2019 22:12:00  Our recommendations are outlined below.  Recommendations:     .  Activate Stroke Protocol Admission/Order Set     .  Stroke/Telemetry Floor     .  Neuro Checks     .  Bedside Swallow Eval     .  DVT Prophylaxis     .  IV Fluids, Normal Saline     .  Head of Bed Below 30 Degrees     .  Euglycemia and Avoid Hyperthermia (PRN Acetaminophen)  .  Antiplatelet Therapy Recommended  Routine Consultation with [facility] for Follow up Care  Sign Out:     .  Discussed with Emergency Department Provider    ------------------------------------------------------------------------------  History of Present Illness: Patient is a 73 year old Female.  Patient was brought by private transportation with symptoms of Slurred speech  This 74 year old woman was last seen normal at 1700. Family founder sometime later with was described initially slurred speech but she was aphasic when she arrived at the emergency department. She had no weakness in the extremities. She has a history of hypertension but no diabetes, coronary disease, or atrial fibrillation. She has hyperlipidemia. She is not on antiplatelet or anticoagulant medication. She was said to have had a small stroke in April 2019.  CT head showed no acute hemorrhage or acute core infarct. CT head was reviewed.  Last seen normal was beyond 4.5 hours of presentation. There is no history of hemorrhagic complications or intracranial hemorrhage. There is no history of Recent Anticoagulants. There is no history of recent major surgery. There is no history of recent stroke.  Examination: BP(203/91), Pulse(77 sinus rhythm), Blood Glucose(136) 1A: Level of Consciousness - Alert; keenly responsive + 0 1B: Ask Month and Age - 1 Question Right + 1 1C: Blink Eyes & Squeeze Hands - Performs Both Tasks + 0 2: Test Horizontal Extraocular Movements - Normal + 0 3: Test Visual Fields - No Visual Loss + 0 4: Test Facial Palsy (Use Grimace if Obtunded) - Normal symmetry + 0 5A: Test Left Arm Motor  Drift - No Drift for 10 Seconds + 0 5B: Test Right Arm Motor Drift - No Drift for 10 Seconds + 0 6A: Test Left Leg Motor Drift - No Drift for 5 Seconds + 0 6B: Test Right Leg Motor Drift - No Drift for 5 Seconds + 0 7: Test Limb Ataxia (FNF/Heel-Shin) - No Ataxia + 0 8: Test Sensation - Normal; No  sensory loss + 0 9: Test Language/Aphasia - Mild-Moderate Aphasia: Some Obvious Changes, Without Significant Limitation + 1 10: Test Dysarthria - Normal + 0 11: Test Extinction/Inattention - No abnormality + 0  NIHSS Score: 2  Patient was informed the Neurology Consult would happen via TeleHealth consult by way of interactive audio and video telecommunications and consented to receiving care in this manner.  Due to the immediate potential for life-threatening deterioration due to underlying acute neurologic illness, I spent 35 minutes providing critical care. This time includes time for face to face visit via telemedicine, review of medical records, imaging studies and discussion of findings with providers, the patient and/or family.   Dr Cindie Laroche   [facility] [phone]  Case [id number]

## 2019-02-14 NOTE — H&P (Addendum)
[facility] History and Physical  Brittany Goodman CHY:[id number] DOB: 12/14/1945 DOA: 02/14/2019  Referring physician:  PCP: [name], MD   Chief Complaint: Stroke  HPI: Brittany Goodman is a 73 y.o. WF PMHx stroke, depression, HTN, diabetes type 2 uncontrolled with complication, HLD, OSA (does not use CPAP), spinal stenosis  Presents to ED with difficulty talking.Has an isolated expressive aphasia which is relatively mild. She has no motor or sensory deficit. With time rehabilitation she has a reasonable chance of functional recovery. She needs antiplatelet therapy and pass of hypertension for now but been better blood pressure control.     Review of Systems:  Constitutional:  No weight loss, night sweats, Fevers, chills, fatigue.  HEENT:  No headaches, Difficulty swallowing,Tooth/dental problems,Sore throat,  No sneezing, itching, ear ache, nasal congestion, post nasal drip,  Cardio-vascular:  No chest pain, Orthopnea, PND, swelling in lower extremities, anasarca, dizziness, palpitations  GI:  No heartburn, indigestion, abdominal pain, nausea, vomiting, diarrhea, change in bowel habits, loss of appetite  Resp:  No shortness of breath with exertion or at rest. No excess mucus, no productive cough, No non-productive cough, No coughing up of blood.No change in color of mucus.No wheezing.No chest wall deformity  Skin:  no rash or lesions.  GU:  no dysuria, change in color of urine, no urgency or frequency. No flank pain.  Musculoskeletal:  No joint pain or swelling. No decreased range of motion. No back pain.  Psych:  No change in mood or affect. No depression or anxiety. No memory loss.   Past Medical History:  Diagnosis Date  . Arthritis   . CVA (cerebral vascular accident) ([place]) 02/14/2019  . Depression   . Depression 02/14/2019  . Diabetes mellitus type 2, uncontrolled, with complications ([place]) 8/78/6767  . Diabetes mellitus type II   . Diverticulosis   . GERD  (gastroesophageal reflux disease)   . Hyperlipidemia   . Hypertension   . Hypothyroidism   . OSA (obstructive sleep apnea) 02/14/2019  . Sepsis ([place])    2011, Escherichia coli pyelonephritis  . Sleep apnea    Does not use CPAP. Cannot tolerate  . Spinal stenosis   . Spinal stenosis 02/14/2019  . Stroke [facility])    no deficits   Past Surgical History:  Procedure Laterality Date  . BACK SURGERY    . CARPAL TUNNEL RELEASE Right   . COLONOSCOPY N/A 05/29/2013   Focal left colonic inflammation, likely remnant of recent bout of ischemic or segmental colitis, s/p biopsy. pancolonic diverticulosis. Due for surveillance in 5 years  . COLONOSCOPY WITH PROPOFOL N/A 07/26/2018   Procedure: COLONOSCOPY WITH PROPOFOL;  Surgeon: Daneil Dolin, MD;  [facility];  Service: Endoscopy;  Laterality: N/A;  8:30am  . ESOPHAGOGASTRODUODENOSCOPY (EGD) WITH ESOPHAGEAL DILATION N/A 05/29/2013   normal appearing patent tubular esophagus, small hiatal hernia, multiple antral erosions. no ulcer. normlal duodenum. empiric dilation  . ILIOTIBIAL BAND RELEASE Left   . POLYPECTOMY  07/26/2018   Procedure: POLYPECTOMY;  Surgeon: Daneil Dolin, MD;  Location: [facility];  Service: Endoscopy;;  hepatic flexure polyp  . TOTAL ABDOMINAL HYSTERECTOMY     Social History:  reports that she quit smoking about 52 years ago. She has a 26.00 pack-year smoking history. She has quit using smokeless tobacco.  Her smokeless tobacco use included chew. She reports that she does not drink alcohol or use drugs.  Allergies  Allergen Reactions  . Celecoxib Swelling    Pt states she  can tolerate ibuprofen  . Codeine Other (See Comments)    "makes me feel bad"  . Dilaudid [Hydromorphone Hcl] Other (See Comments)    hallucination  . Morphine Nausea And Vomiting  . Statins     Muscle pain  . Tetracycline Nausea And Vomiting    Family History  Problem Relation Age of Onset  . Heart disease Mother        MI  . Heart  disease Father   . Heart attack Father 21  . Heart disease Brother   . Cancer Brother        ADRENAL GLAND  . Colon polyps Brother   . Liver cancer Brother 88       deceased , ?metastatic cancer?   . Heart disease Maternal Grandfather        MI  . Cancer Paternal Grandfather        LUNG  . Colon cancer Neg Hx   Secondary patient's expressive aphasia unable to elicit family history    Prior to Admission medications   Medication Sig Start Date End Date Taking? Authorizing Provider  acetaminophen (TYLENOL) 500 MG tablet Take 1,000 mg by mouth 2 (two) times daily as needed for mild pain or moderate pain.     [provider]  Ascorbic Acid (VITAMIN C) 1000 MG tablet Take 1,000-2,000 mg by mouth daily.     [provider]  atenolol (TENORMIN) 100 MG tablet Take 100 mg by mouth every morning.     [provider]  Biotin 5000 MCG TABS Take 1 tablet by mouth daily.    [provider]  Cholecalciferol (VITAMIN D) 125 MCG (5000 UT) CAPS Take 1 capsule by mouth daily.     [provider]  citalopram (CELEXA) 20 MG tablet Take 20 mg by mouth every other day.     [provider]  cyclobenzaprine (FLEXERIL) 10 MG tablet Take 10 mg by mouth 2 (two) times daily as needed for muscle spasms.     [provider]  folic acid (FOLVITE) 244 MCG tablet Take 800 mcg by mouth daily.     [provider]  glimepiride (AMARYL) 1 MG tablet Take 1 mg by mouth daily before breakfast.  04/23/13   [provider]  HYDROcodone-acetaminophen (NORCO/VICODIN) 5-325 MG tablet Take 1 tablet by mouth every 4 (four) hours as needed for moderate pain.  03/10/17   [provider]  levothyroxine (SYNTHROID, LEVOTHROID) 88 MCG tablet Take 88 mcg by mouth daily before breakfast.  01/18/17   [provider]  lisinopril (PRINIVIL,ZESTRIL) 10 MG tablet Take 1 tablet (10 mg total) by mouth daily for 30 days. 12/03/18 01/31/19  Johnson, Clanford  L, MD  loratadine (CLARITIN) 10 MG tablet Take 10 mg by mouth daily.    [provider]  metFORMIN (GLUCOPHAGE) 1000 MG tablet Take 1,000 mg by mouth 2 (two) times daily with a meal.  12/01/16   [provider]  NONFORMULARY OR COMPOUNDED ITEM Apply 1 application topically 3 (three) times daily as needed (Pain). Diclofenac, Baclofen, Gabapentin, Lidocaine, Menthol - Compounded at [employer], Historical, MD  omeprazole (PRILOSEC) 20 MG capsule Take 20 mg by mouth daily.     [provider]  polyethylene glycol (MIRALAX / GLYCOLAX) packet Take 17 g by mouth daily as needed for moderate constipation or severe constipation. 12/02/18   Johnson, Clanford L, MD  vitamin B-12 (CYANOCOBALAMIN) 1000 MCG tablet Take 1,000 mcg by mouth daily.  [provider]     Consultants:  Kathrine Cords, MD TeleNeurology     Procedures/Significant Events:  3/26 CT head W. Wo contrast: Ganglionic level infarction: Sub-ganglionic infarction      I have personally reviewed and interpreted all radiology studies and my findings are as above.   VENTILATOR SETTINGS: None   Cultures None  Antimicrobials: None   Devices None   LINES / TUBES:  None    Continuous Infusions:  Physical Exam: Vitals:   02/14/19 2201 02/14/19 2202 02/14/19 2215 02/14/19 2300  BP:    (!) 191/91  Pulse:  78 78 73  Resp:  20 17 18   Temp: 97.8 F (36.6 C)     TempSrc: Oral     SpO2:  100% 100% 97%  Weight:      Height:        Wt Readings from Last 3 Encounters:  02/14/19 76.2 kg  01/31/19 76.2 kg  12/11/18 79.4 kg    General: Alert, follows commands, expressive aphasia, no acute respiratory distress Eyes: negative scleral hemorrhage, negative anisocoria, negative icterus ENT: Negative Runny nose, negative gingival bleeding, Neck:  Negative scars, masses, torticollis, lymphadenopathy, JVD Lungs: Clear to auscultation bilaterally without wheezes or  crackles Cardiovascular: Regular rate and rhythm without murmur gallop or rub normal S1 and S2 Abdomen: negative abdominal pain, nondistended, positive soft, bowel sounds, no rebound, no ascites, no appreciable mass Extremities: No significant cyanosis, clubbing, or edema bilateral lower extremities Skin: Negative rashes, lesions, ulcers Psychiatric:  Negative depression, negative anxiety, negative fatigue, negative mania  Central nervous system:  Cranial nerves II through XII intact, tongue/uvula midline, all extremities muscle strength 5/5, sensation intact throughout, finger nose finger bilateral within normal limits, quick finger touch bilateral within normal limits, negative Romberg sign, heel to shin bilateral within normal limits,negative dysarthria, positive expressive aphasia, negative receptive aphasia.        Labs on Admission:  Basic Metabolic Panel: Recent Labs  Lab 02/14/19 2147  NA 125*  K 4.0  CL 91*  CO2 21*  GLUCOSE 134*  BUN 9  CREATININE 0.67  CALCIUM 9.8   Liver Function Tests: Recent Labs  Lab 02/14/19 2147  AST 17  ALT 13  ALKPHOS 51  BILITOT 0.4  PROT 8.0  ALBUMIN 4.4   No results for input(s): LIPASE, AMYLASE in the last 168 hours. No results for input(s): AMMONIA in the last 168 hours. CBC: Recent Labs  Lab 02/14/19 2147  WBC 9.4  NEUTROABS 4.8  HGB 13.2  HCT 40.7  MCV 81.1  PLT 259   Cardiac Enzymes: No results for input(s): CKTOTAL, CKMB, CKMBINDEX, TROPONINI in the last 168 hours.  BNP (last 3 results) No results for input(s): BNP in the last 8760 hours.  ProBNP (last 3 results) No results for input(s): PROBNP in the last 8760 hours.  CBG: Recent Labs  Lab 02/14/19 2159  GLUCAP 136*    Radiological Exams on Admission: Ct Head Code Stroke Wo Contrast  Result Date: 02/14/2019 CLINICAL DATA:  Code stroke. 73 year old female with sudden onset weakness and slurred speech. EXAM: CT HEAD WITHOUT CONTRAST TECHNIQUE: Contiguous  axial images were obtained from the base of the skull through the vertex without intravenous contrast. COMPARISON:  11/28/2018 head CT. FINDINGS: Brain: Cerebral volume is within normal limits for age. No midline shift, ventriculomegaly, mass effect, evidence of mass lesion, intracranial hemorrhage or evidence of cortically based acute infarction. Gray-white matter differentiation is within normal limits throughout the brain. No cortical  encephalomalacia identified. Vascular: Mild Calcified atherosclerosis at the skull base. No suspicious intracranial vascular hyperdensity. Skull: No acute osseous abnormality identified. Sinuses/Orbits: Chronic right sphenoid sinus disease. Other Visualized paranasal sinuses and mastoids are stable and well pneumatized. Other: Visualized orbits and scalp soft tissues are within normal limits. ASPECTS ([place] Stroke Program Early CT Score) - Ganglionic level infarction (caudate, lentiform nuclei, internal capsule, insula, M1-M3 cortex): 7 - Supraganglionic infarction (M4-M6 cortex): 3 Total score (0-10 with 10 being normal): 10 IMPRESSION: 1. Stable and normal for age noncontrast CT appearance of the brain. ASPECTS is 10. 2. Chronic right sphenoid sinuses. Study discussed by telephone with Dr. Fredia Sorrow on 02/14/2019 at 21:59 . Electronically Signed   By: Genevie Ann M.D.   On: 02/14/2019 21:59    EKG: Pending  Assessment/Plan Active Problems:   HYPERTENSION, BENIGN ESSENTIAL   GERD (gastroesophageal reflux disease)   Cerebrovascular disease   Diverticulosis of colon with hemorrhage   CVA (cerebral vascular accident) ([place])   Depression   OSA (obstructive sleep apnea)   Spinal stenosis   Diabetes mellitus type 2, uncontrolled, with complications (HCC)  Acute on chronic CVA -Permissive HTN - SBP goal 140-180/DBP goal 90-110 -Decrease home lisinopril 2.5 mg daily -Hold Atenolol -Hydralazine 5 mg SBP> 190 or DBP> 110 -MR/MRA brain pending -Ultrasound bilateral  carotids pending -Echocardiogram pending  Essential HTN -See CVA  Diabetes type 2 uncontrolled with complication -Hemoglobin A1c pending -Lipid panel pending - Moderate SSI   Depression Celexa 20 mg daily   Spinal stenosis -Flexeril 10 mg twice daily -Norco 5/325 mg every 4 hours PRN  Hypothyroidism -88 mcg daily -TSH pending    Code Status: Full (DVT Prophylaxis: Heparin Family Communication: None Disposition Plan: Per neurology   Data Reviewed: Care during the described time interval was provided by me .  I have reviewed this patient's available data, including medical history, events of note, physical examination, and all test results as part of my evaluation.   Time spent: 60 min  [place] Pager [phone]

## 2019-02-14 NOTE — ED Notes (Signed)
cbg  136

## 2019-02-15 ENCOUNTER — Telehealth: Payer: Self-pay | Admitting: *Deleted

## 2019-02-15 ENCOUNTER — Inpatient Hospital Stay (HOSPITAL_COMMUNITY): Payer: Medicare Other

## 2019-02-15 ENCOUNTER — Other Ambulatory Visit (HOSPITAL_COMMUNITY): Payer: Medicare Other

## 2019-02-15 ENCOUNTER — Other Ambulatory Visit (HOSPITAL_COMMUNITY): Payer: Self-pay | Admitting: Cardiology

## 2019-02-15 ENCOUNTER — Encounter (HOSPITAL_COMMUNITY): Payer: Self-pay | Admitting: *Deleted

## 2019-02-15 DIAGNOSIS — E1151 Type 2 diabetes mellitus with diabetic peripheral angiopathy without gangrene: Secondary | ICD-10-CM | POA: Diagnosis present

## 2019-02-15 DIAGNOSIS — R4701 Aphasia: Secondary | ICD-10-CM | POA: Diagnosis present

## 2019-02-15 DIAGNOSIS — Z8371 Family history of colonic polyps: Secondary | ICD-10-CM | POA: Diagnosis not present

## 2019-02-15 DIAGNOSIS — Z886 Allergy status to analgesic agent status: Secondary | ICD-10-CM | POA: Diagnosis not present

## 2019-02-15 DIAGNOSIS — I639 Cerebral infarction, unspecified: Secondary | ICD-10-CM

## 2019-02-15 DIAGNOSIS — E785 Hyperlipidemia, unspecified: Secondary | ICD-10-CM | POA: Diagnosis present

## 2019-02-15 DIAGNOSIS — Z885 Allergy status to narcotic agent status: Secondary | ICD-10-CM | POA: Diagnosis not present

## 2019-02-15 DIAGNOSIS — I361 Nonrheumatic tricuspid (valve) insufficiency: Secondary | ICD-10-CM

## 2019-02-15 DIAGNOSIS — I63011 Cerebral infarction due to thrombosis of right vertebral artery: Secondary | ICD-10-CM | POA: Diagnosis not present

## 2019-02-15 DIAGNOSIS — K5731 Diverticulosis of large intestine without perforation or abscess with bleeding: Secondary | ICD-10-CM | POA: Diagnosis present

## 2019-02-15 DIAGNOSIS — Z8249 Family history of ischemic heart disease and other diseases of the circulatory system: Secondary | ICD-10-CM | POA: Diagnosis not present

## 2019-02-15 DIAGNOSIS — Z79899 Other long term (current) drug therapy: Secondary | ICD-10-CM | POA: Diagnosis not present

## 2019-02-15 DIAGNOSIS — F329 Major depressive disorder, single episode, unspecified: Secondary | ICD-10-CM | POA: Diagnosis present

## 2019-02-15 DIAGNOSIS — Z79890 Hormone replacement therapy (postmenopausal): Secondary | ICD-10-CM | POA: Diagnosis not present

## 2019-02-15 DIAGNOSIS — G4730 Sleep apnea, unspecified: Secondary | ICD-10-CM | POA: Diagnosis present

## 2019-02-15 DIAGNOSIS — I700 Atherosclerosis of aorta: Secondary | ICD-10-CM | POA: Diagnosis not present

## 2019-02-15 DIAGNOSIS — Z7984 Long term (current) use of oral hypoglycemic drugs: Secondary | ICD-10-CM | POA: Diagnosis not present

## 2019-02-15 DIAGNOSIS — M4800 Spinal stenosis, site unspecified: Secondary | ICD-10-CM | POA: Diagnosis present

## 2019-02-15 DIAGNOSIS — I6389 Other cerebral infarction: Secondary | ICD-10-CM | POA: Diagnosis not present

## 2019-02-15 DIAGNOSIS — Z87891 Personal history of nicotine dependence: Secondary | ICD-10-CM | POA: Diagnosis not present

## 2019-02-15 DIAGNOSIS — R29702 NIHSS score 2: Secondary | ICD-10-CM | POA: Diagnosis present

## 2019-02-15 DIAGNOSIS — Z79891 Long term (current) use of opiate analgesic: Secondary | ICD-10-CM | POA: Diagnosis not present

## 2019-02-15 DIAGNOSIS — E1165 Type 2 diabetes mellitus with hyperglycemia: Secondary | ICD-10-CM | POA: Diagnosis present

## 2019-02-15 DIAGNOSIS — E039 Hypothyroidism, unspecified: Secondary | ICD-10-CM | POA: Diagnosis present

## 2019-02-15 DIAGNOSIS — I10 Essential (primary) hypertension: Secondary | ICD-10-CM | POA: Diagnosis present

## 2019-02-15 DIAGNOSIS — Z800 Family history of malignant neoplasm of digestive organs: Secondary | ICD-10-CM | POA: Diagnosis not present

## 2019-02-15 DIAGNOSIS — G4733 Obstructive sleep apnea (adult) (pediatric): Secondary | ICD-10-CM | POA: Diagnosis present

## 2019-02-15 DIAGNOSIS — K219 Gastro-esophageal reflux disease without esophagitis: Secondary | ICD-10-CM | POA: Diagnosis present

## 2019-02-15 LAB — URINALYSIS, ROUTINE W REFLEX MICROSCOPIC
Bilirubin Urine: NEGATIVE
Glucose, UA: NEGATIVE mg/dL
HGB URINE DIPSTICK: NEGATIVE
Ketones, ur: 5 mg/dL — AB
Leukocytes,Ua: NEGATIVE
Nitrite: NEGATIVE
Protein, ur: NEGATIVE mg/dL
Specific Gravity, Urine: 1.005 (ref 1.005–1.030)
pH: 7 (ref 5.0–8.0)

## 2019-02-15 LAB — HEMOGLOBIN A1C
HEMOGLOBIN A1C: 5.9 % — AB (ref 4.8–5.6)
MEAN PLASMA GLUCOSE: 122.63 mg/dL

## 2019-02-15 LAB — COMPREHENSIVE METABOLIC PANEL
ALBUMIN: 4 g/dL (ref 3.5–5.0)
ALT: 12 U/L (ref 0–44)
AST: 14 U/L — ABNORMAL LOW (ref 15–41)
Alkaline Phosphatase: 45 U/L (ref 38–126)
Anion gap: 12 (ref 5–15)
BUN: 8 mg/dL (ref 8–23)
CO2: 24 mmol/L (ref 22–32)
Calcium: 9.5 mg/dL (ref 8.9–10.3)
Chloride: 91 mmol/L — ABNORMAL LOW (ref 98–111)
Creatinine, Ser: 0.7 mg/dL (ref 0.44–1.00)
GFR calc Af Amer: >60 mL/min (ref >60)
GFR calc non Af Amer: >60 mL/min (ref >60)
Glucose, Bld: 135 mg/dL — ABNORMAL HIGH (ref 70–99)
Potassium: 4.7 mmol/L (ref 3.5–5.1)
Sodium: 127 mmol/L — ABNORMAL LOW (ref 135–145)
Total Bilirubin: 0.5 mg/dL (ref 0.3–1.2)
Total Protein: 7.2 g/dL (ref 6.5–8.1)

## 2019-02-15 LAB — T4, FREE: Free T4: 1.06 ng/dL (ref 0.82–1.77)

## 2019-02-15 LAB — RAPID URINE DRUG SCREEN, HOSP PERFORMED
AMPHETAMINES: NOT DETECTED
Barbiturates: NOT DETECTED
Benzodiazepines: NOT DETECTED
Cocaine: NOT DETECTED
Opiates: NOT DETECTED
Tetrahydrocannabinol: NOT DETECTED

## 2019-02-15 LAB — ECHOCARDIOGRAM COMPLETE BUBBLE STUDY
Height: 64 in
Weight: 2677.27 oz

## 2019-02-15 LAB — LIPID PANEL
Cholesterol: 286 mg/dL — ABNORMAL HIGH (ref 0–200)
HDL: 70 mg/dL (ref >40)
LDL Cholesterol: 188 mg/dL — ABNORMAL HIGH (ref 0–99)
Total CHOL/HDL Ratio: 4.1 RATIO
Triglycerides: 139 mg/dL (ref <150)
VLDL: 28 mg/dL (ref 0–40)

## 2019-02-15 LAB — TSH: TSH: 8.229 u[IU]/mL — ABNORMAL HIGH (ref 0.350–4.500)

## 2019-02-15 LAB — GLUCOSE, CAPILLARY
Glucose-Capillary: 147 mg/dL — ABNORMAL HIGH (ref 70–99)
Glucose-Capillary: 129 mg/dL — ABNORMAL HIGH (ref 70–99)
Glucose-Capillary: 129 mg/dL — ABNORMAL HIGH (ref 70–99)

## 2019-02-15 LAB — MRSA PCR SCREENING: MRSA BY PCR: NEGATIVE

## 2019-02-15 MED ORDER — CLOPIDOGREL BISULFATE 75 MG PO TABS: 75.0000 mg | ORAL_TABLET | Freq: Every day | ORAL | 11 refills | Status: AC

## 2019-02-15 MED ORDER — LISINOPRIL 5 MG PO TABS
2.5000 mg | ORAL_TABLET | Freq: Every day | ORAL | Status: DC
  Administered 2019-02-15: 2.5 mg via ORAL
  Filled 2019-02-15: qty 1

## 2019-02-15 MED ORDER — HYDROCODONE-ACETAMINOPHEN 5-325 MG PO TABS
1.0000 | ORAL_TABLET | ORAL | Status: DC | PRN
  Administered 2019-02-15: 1 via ORAL
  Filled 2019-02-15: qty 1

## 2019-02-15 MED ORDER — SODIUM CHLORIDE 0.9 % IV SOLN
INTRAVENOUS | Status: DC
  Administered 2019-02-15: 03:00:00 via INTRAVENOUS

## 2019-02-15 MED ORDER — ACETAMINOPHEN 650 MG RE SUPP: 650.0000 mg | RECTAL | Status: DC | PRN

## 2019-02-15 MED ORDER — ACETAMINOPHEN 160 MG/5ML PO SOLN: 650.0000 mg | ORAL | Status: DC | PRN

## 2019-02-15 MED ORDER — ASPIRIN 325 MG PO TABS
325.0000 mg | ORAL_TABLET | Freq: Every day | ORAL | Status: DC
  Administered 2019-02-15: 325 mg via ORAL
  Filled 2019-02-15: qty 1

## 2019-02-15 MED ORDER — SIMVASTATIN 10 MG PO TABS: 10.0000 mg | ORAL_TABLET | Freq: Every day | ORAL | 2 refills | Status: DC

## 2019-02-15 MED ORDER — INSULIN ASPART 100 UNIT/ML ~~LOC~~ SOLN
0.0000 [IU] | SUBCUTANEOUS | Status: DC
  Administered 2019-02-15 (×3): 2 [IU] via SUBCUTANEOUS

## 2019-02-15 MED ORDER — CYCLOBENZAPRINE HCL 10 MG PO TABS
10.0000 mg | ORAL_TABLET | Freq: Two times a day (BID) | ORAL | Status: DC | PRN
  Administered 2019-02-15: 10 mg via ORAL
  Filled 2019-02-15: qty 1

## 2019-02-15 MED ORDER — SIMVASTATIN 10 MG PO TABS
10.0000 mg | ORAL_TABLET | Freq: Every day | ORAL | Status: DC
  Administered 2019-02-15: 10 mg via ORAL
  Filled 2019-02-15: qty 1

## 2019-02-15 MED ORDER — CITALOPRAM HYDROBROMIDE 20 MG PO TABS
20.0000 mg | ORAL_TABLET | ORAL | Status: DC
  Administered 2019-02-15: 20 mg via ORAL
  Filled 2019-02-15 (×2): qty 1

## 2019-02-15 MED ORDER — SENNOSIDES-DOCUSATE SODIUM 8.6-50 MG PO TABS
1.0000 | ORAL_TABLET | Freq: Every evening | ORAL | Status: DC | PRN
  Filled 2019-02-15: qty 1

## 2019-02-15 MED ORDER — HEPARIN SODIUM (PORCINE) 5000 UNIT/ML IJ SOLN
5000.0000 [IU] | Freq: Three times a day (TID) | INTRAMUSCULAR | Status: DC
  Administered 2019-02-15 (×2): 5000 [IU] via SUBCUTANEOUS
  Filled 2019-02-15 (×2): qty 1

## 2019-02-15 MED ORDER — ACETAMINOPHEN 325 MG PO TABS
650.0000 mg | ORAL_TABLET | ORAL | Status: DC | PRN
  Administered 2019-02-15: 650 mg via ORAL
  Filled 2019-02-15: qty 2

## 2019-02-15 MED ORDER — HYDRALAZINE HCL 20 MG/ML IJ SOLN
5.0000 mg | Freq: Four times a day (QID) | INTRAMUSCULAR | Status: DC | PRN
  Administered 2019-02-15: 5 mg via INTRAVENOUS
  Filled 2019-02-15: qty 1

## 2019-02-15 MED ORDER — ASPIRIN EC 81 MG PO TBEC: 81.0000 mg | DELAYED_RELEASE_TABLET | Freq: Every day | ORAL | 2 refills | Status: AC

## 2019-02-15 MED ORDER — STROKE: EARLY STAGES OF RECOVERY BOOK
Freq: Once | Status: AC
  Administered 2019-02-15: 04:00:00

## 2019-02-15 MED ORDER — LEVOTHYROXINE SODIUM 88 MCG PO TABS
88.0000 ug | ORAL_TABLET | Freq: Every day | ORAL | Status: DC
  Administered 2019-02-15: 88 ug via ORAL
  Filled 2019-02-15 (×2): qty 1

## 2019-02-15 NOTE — ED Notes (Signed)
ED TO INPATIENT HANDOFF REPORT  ED Nurse Name and Phone #:  Rosalie Gums;, RN  S Name/Age/Gender Brittany Goodman 73 y.o. female Room/Bed: APA11/APA11  Code Status   Code Status: Full Code  Home/SNF/Other Home Patient oriented to: self, place, time and situation Is this baseline? Yes   Triage Complete: Triage complete  Chief Complaint Stroke Sx (Speech Deficit for 1 hour)  Triage Note Pt brought to ED by family with difficulty speaking. LKW 1700.   Allergies Allergies  Allergen Reactions  . Celecoxib Swelling    Pt states she can tolerate ibuprofen  . Codeine Other (See Comments)    "makes me feel bad"  . Dilaudid [Hydromorphone Hcl] Other (See Comments)    hallucination  . Morphine Nausea And Vomiting  . Statins     Muscle pain  . Tetracycline Nausea And Vomiting    Level of Care/Admitting Diagnosis ED Disposition    ED Disposition Condition [place] [facility]: [facility] [[id number]]  Level of Care: Stepdown [14]  Diagnosis: CVA (cerebral vascular accident) [facility]) [[id number]]  Admitting Physician: Allie Bossier [[id number]]  Attending Physician: Allie Bossier [[id number]]  Estimated length of stay: 5 - 7 days  Certification:: I certify this patient will need inpatient services for at least 2 midnights  PT Class (Do Not Modify): Inpatient [101]  PT Acc Code (Do Not Modify): Private [1]       B Medical/Surgery History Past Medical History:  Diagnosis Date  . Arthritis   . CVA (cerebral vascular accident) ([place]) 02/14/2019  . Depression   . Depression 02/14/2019  . Diabetes mellitus type 2, uncontrolled, with complications ([place]) 0/45/9977  . Diabetes mellitus type II   . Diverticulosis   . GERD (gastroesophageal reflux disease)   . Hyperlipidemia   . Hypertension   . Hypothyroidism   . OSA (obstructive sleep apnea) 02/14/2019  . Sepsis ([place])    2011, Escherichia coli pyelonephritis  . Sleep apnea    Does not use CPAP. Cannot tolerate  .  Spinal stenosis   . Spinal stenosis 02/14/2019  . Stroke [facility])    no deficits   Past Surgical History:  Procedure Laterality Date  . BACK SURGERY    . CARPAL TUNNEL RELEASE Right   . COLONOSCOPY N/A 05/29/2013   Focal left colonic inflammation, likely remnant of recent bout of ischemic or segmental colitis, s/p biopsy. pancolonic diverticulosis. Due for surveillance in 5 years  . COLONOSCOPY WITH PROPOFOL N/A 07/26/2018   Procedure: COLONOSCOPY WITH PROPOFOL;  Surgeon: Daneil Dolin, MD;  [facility];  Service: Endoscopy;  Laterality: N/A;  8:30am  . ESOPHAGOGASTRODUODENOSCOPY (EGD) WITH ESOPHAGEAL DILATION N/A 05/29/2013   normal appearing patent tubular esophagus, small hiatal hernia, multiple antral erosions. no ulcer. normlal duodenum. empiric dilation  . ILIOTIBIAL BAND RELEASE Left   . POLYPECTOMY  07/26/2018   Procedure: POLYPECTOMY;  Surgeon: Daneil Dolin, MD;  [facility];  Service: Endoscopy;;  hepatic flexure polyp  . TOTAL ABDOMINAL HYSTERECTOMY       A IV Location/Drains/Wounds Patient Lines/Drains/Airways Status   Active Line/Drains/Airways    Name:   Placement date:   Placement time:   Site:   Days:   Peripheral IV 02/14/19 Right Antecubital   02/14/19    2146    Antecubital   1   External Urinary Catheter   02/15/19    0113    -   less than 1  Intake/Output Last 24 hours No intake or output data in the 24 hours ending 02/15/19 0117  Labs/Imaging Results for orders placed or performed during the hospital encounter of 02/14/19 (from the past 48 hour(s))  Ethanol     Status: None   Collection Time: 02/14/19  9:47 PM  Result Value Ref Range   Alcohol, Ethyl (B) <10 <10 mg/dL    Comment: (NOTE) Lowest detectable limit for serum alcohol is 10 mg/dL. For medical purposes only. Performed at [facility], [address], [place], [place] [address]   Protime-INR     Status: None   Collection Time: 02/14/19  9:47 PM  Result Value Ref  Range   Prothrombin Time 12.3 11.4 - 15.2 seconds   INR 0.9 0.8 - 1.2    Comment: (NOTE) INR goal varies based on device and disease states. Performed at [facility], [address], [place], [place] [address]   APTT     Status: None   Collection Time: 02/14/19  9:47 PM  Result Value Ref Range   aPTT 27 24 - 36 seconds    Comment: Performed at [facility], [address], [place], [place] [address]  CBC     Status: Abnormal   Collection Time: 02/14/19  9:47 PM  Result Value Ref Range   WBC 9.4 4.0 - 10.5 K/uL   RBC 5.02 3.87 - 5.11 MIL/uL   Hemoglobin 13.2 12.0 - 15.0 g/dL   HCT 40.7 36.0 - 46.0 %   MCV 81.1 80.0 - 100.0 fL   MCH 26.3 26.0 - 34.0 pg   MCHC 32.4 30.0 - 36.0 g/dL   RDW 15.9 (H) 11.5 - 15.5 %   Platelets 259 150 - 400 K/uL   nRBC 0.0 0.0 - 0.2 %    Comment: Performed at [facility], [address], [place], [place] [address]  Differential     Status: None   Collection Time: 02/14/19  9:47 PM  Result Value Ref Range   Neutrophils Relative % 52 %   Neutro Abs 4.8 1.7 - 7.7 K/uL   Lymphocytes Relative 38 %   Lymphs Abs 3.6 0.7 - 4.0 K/uL   Monocytes Relative 9 %   Monocytes Absolute 0.9 0.1 - 1.0 K/uL   Eosinophils Relative 1 %   Eosinophils Absolute 0.1 0.0 - 0.5 K/uL   Basophils Relative 0 %   Basophils Absolute 0.0 0.0 - 0.1 K/uL   Immature Granulocytes 0 %   Abs Immature Granulocytes 0.03 0.00 - 0.07 K/uL    Comment: Performed at [facility], [address], [place], [place] [address]  Comprehensive metabolic panel     Status: Abnormal   Collection Time: 02/14/19  9:47 PM  Result Value Ref Range   Sodium 125 (L) 135 - 145 mmol/L   Potassium 4.0 3.5 - 5.1 mmol/L   Chloride 91 (L) 98 - 111 mmol/L   CO2 21 (L) 22 - 32 mmol/L   Glucose, Bld 134 (H) 70 - 99 mg/dL   BUN 9 8 - 23 mg/dL   Creatinine, Ser 0.67 0.44 - 1.00 mg/dL   Calcium 9.8 8.9 - 10.3 mg/dL   Total Protein 8.0 6.5 - 8.1 g/dL   Albumin 4.4 3.5 - 5.0 g/dL   AST 17 15 - 41 U/L   ALT 13 0 -  44 U/L   Alkaline Phosphatase 51 38 - 126 U/L   Total Bilirubin 0.4 0.3 - 1.2 mg/dL   GFR calc non Af Amer >60 >  60 mL/min   GFR calc Af Amer >60 >60 mL/min   Anion gap 13 5 - 15    Comment: Performed at [facility], [address], [place],  [address]  CBG monitoring, ED     Status: Abnormal   Collection Time: 02/14/19  9:59 PM  Result Value Ref Range   Glucose-Capillary 136 (H) 70 - 99 mg/dL   Ct Head Code Stroke Wo Contrast  Result Date: 02/14/2019 CLINICAL DATA:  Code stroke. 73 year old female with sudden onset weakness and slurred speech. EXAM: CT HEAD WITHOUT CONTRAST TECHNIQUE: Contiguous axial images were obtained from the base of the skull through the vertex without intravenous contrast. COMPARISON:  11/28/2018 head CT. FINDINGS: Brain: Cerebral volume is within normal limits for age. No midline shift, ventriculomegaly, mass effect, evidence of mass lesion, intracranial hemorrhage or evidence of cortically based acute infarction. Gray-white matter differentiation is within normal limits throughout the brain. No cortical encephalomalacia identified. Vascular: Mild Calcified atherosclerosis at the skull base. No suspicious intracranial vascular hyperdensity. Skull: No acute osseous abnormality identified. Sinuses/Orbits: Chronic right sphenoid sinus disease. Other Visualized paranasal sinuses and mastoids are stable and well pneumatized. Other: Visualized orbits and scalp soft tissues are within normal limits. ASPECTS ([place] Stroke Program Early CT Score) - Ganglionic level infarction (caudate, lentiform nuclei, internal capsule, insula, M1-M3 cortex): 7 - Supraganglionic infarction (M4-M6 cortex): 3 Total score (0-10 with 10 being normal): 10 IMPRESSION: 1. Stable and normal for age noncontrast CT appearance of the brain. ASPECTS is 10. 2. Chronic right sphenoid sinuses. Study discussed by telephone with Dr. Fredia Sorrow on 02/14/2019 at 21:59 . Electronically Signed   By: Genevie Ann  M.D.   On: 02/14/2019 21:59    Pending Labs Unresulted Labs (From admission, onward)    Start     Ordered   02/15/19 0500  Hemoglobin A1c  Tomorrow morning,   R     02/15/19 0049   02/15/19 0500  Lipid panel  Tomorrow morning,   R    Comments:  Fasting    02/15/19 0049   02/15/19 0500  Comprehensive metabolic panel  Tomorrow morning,   R     02/15/19 0049   02/15/19 0059  TSH  Once,   R     02/15/19 0059   02/15/19 0047  Urine rapid drug screen (hosp performed)not at [facility],   R     02/15/19 0049   02/14/19 2147  Urine rapid drug screen (hosp performed)  ONCE - STAT,   STAT     02/14/19 2146   02/14/19 2147  Urinalysis, Routine w reflex microscopic  ONCE - STAT,   STAT     02/14/19 2146          Vitals/Pain Today's Vitals   02/14/19 2300 02/14/19 2311 02/14/19 2330 02/15/19 0100  BP: (!) 191/91  (!) 191/102 (!) 195/96  Pulse: 73  82 83  Resp: 18  15 16   Temp:      TempSrc:      SpO2: 97%  97% 96%  Weight:      Height:      PainSc:  0-No pain      Isolation Precautions No active isolations  Medications Medications   stroke: mapping our early stages of recovery book (has no administration in time range)  0.9 %  sodium chloride infusion (has no administration in time range)  acetaminophen (TYLENOL) tablet 650 mg (has no administration in time range)  Or  acetaminophen (TYLENOL) solution 650 mg (has no administration in time range)    Or  acetaminophen (TYLENOL) suppository 650 mg (has no administration in time range)  senna-docusate (Senokot-S) tablet 1 tablet (has no administration in time range)  heparin injection 5,000 Units (has no administration in time range)  lisinopril (PRINIVIL,ZESTRIL) tablet 2.5 mg (has no administration in time range)  hydrALAZINE (APRESOLINE) injection 5 mg (5 mg Intravenous Given 02/15/19 0116)  insulin aspart (novoLOG) injection 0-15 Units (has no administration in time range)  citalopram (CELEXA) tablet 20 mg (has no  administration in time range)  cyclobenzaprine (FLEXERIL) tablet 10 mg (has no administration in time range)  HYDROcodone-acetaminophen (NORCO/VICODIN) 5-325 MG per tablet 1 tablet (has no administration in time range)  levothyroxine (SYNTHROID, LEVOTHROID) tablet 88 mcg (has no administration in time range)  ondansetron (ZOFRAN) injection 4 mg (4 mg Intravenous Given 02/14/19 2157)    Mobility walks Low fall risk   Focused Assessments Neuro Assessment Handoff:  Swallow screen pass? Yes    NIH Stroke Scale ( + Modified Stroke Scale Criteria)  Interval: Shift assessment Level of Consciousness (1a.)   : Alert, keenly responsive LOC Questions (1b. )   +: Answers one question correctly LOC Commands (1c. )   + : Performs both tasks correctly Best Gaze (2. )  +: Normal Visual (3. )  +: No visual loss Facial Palsy (4. )    : Normal symmetrical movements Motor Arm, Left (5a. )   +: No drift Motor Arm, Right (5b. )   +: No drift Motor Leg, Left (6a. )   +: No drift Motor Leg, Right (6b. )   +: No drift Limb Ataxia (7. ): Absent Sensory (8. )   +: Normal, no sensory loss Best Language (9. )   +: (S) Severe aphasia(last NIHSS performed pt was able to identify and name objects, this time pt unable to identify and name when asked and pointing to watch on my wrist. Upon arrival pt could not do this task either, aphasia comes and goes. Dr Clydene Laming at bedside and is aware. ) Dysarthria (10. ): Normal Extinction/Inattention (11.)   +: No Abnormality Modified SS Total  +: 3 Complete NIHSS TOTAL: 3 Last date known well: 02/14/19 Last time known well: 1700 Neuro Assessment:   Neuro Checks:   Initial (02/14/19 2143)  Last Documented NIHSS Modified Score: 3 (02/14/19 2345) Has TPA been given? No If patient is a Neuro Trauma and patient is going to OR before floor call report to [place] nurse: [phone] or [phone]     R Recommendations: See Admitting Provider Note  Report given to:    Additional Notes:

## 2019-02-15 NOTE — Progress Notes (Signed)
IV removed and stroke education reinforced with teach back.  Follow up appointments made and son to drive home

## 2019-02-15 NOTE — Evaluation (Signed)
Physical Therapy Evaluation Patient Details Name: Brittany Goodman MRN: [id number] DOB: [birth year] Today's Date: 02/15/2019   History of Present Illness  Brittany Goodman is a 73 y.o. WF PMHx stroke, depression, HTN, diabetes type 2 uncontrolled with complication, HLD, OSA (does not use CPAP), spinal stenosis. Presents to ED with difficulty talking.Has an isolated expressive aphasia which is relatively mild. She has no motor or sensory deficit. With time rehabilitation she has a reasonable chance of functional recovery. She needs antiplatelet therapy and pass of hypertension for now but been better blood pressure control.     Clinical Impression  Patient functioning near baseline for functional mobility and gait other than slower than normal cadence, no loss of balance and tolerated sitting up in chair after ambulation.  Plan:  Patient discharged from physical therapy to care of nursing for ambulation daily as tolerated for length of stay.     Follow Up Recommendations No PT follow up    Equipment Recommendations  None recommended by PT    Recommendations for Other Services       Precautions / Restrictions Precautions Precautions: None Restrictions Weight Bearing Restrictions: No      Mobility  Bed Mobility Overal bed mobility: Modified Independent             General bed mobility comments: head of bed raised (baseline for patient)  Transfers Overall transfer level: Modified independent Equipment used: None             General transfer comment: increased time  Ambulation/Gait Ambulation/Gait assistance: Modified independent (Device/Increase time) Gait Distance (Feet): 120 Feet Assistive device: None Gait Pattern/deviations: WFL(Within Functional Limits);Staggering left;Staggering right Gait velocity: decreased   General Gait Details: grossly WFL, occasional mild staggering when making turns without loss of balance, overall baseline per patient  Stairs             Wheelchair Mobility    Modified Rankin (Stroke Patients Only)       Balance Overall balance assessment: Mild deficits observed, not formally tested                                           Pertinent Vitals/Pain Pain Assessment: No/denies pain    Home Living Family/patient expects to be discharged to:: Private residence Living Arrangements: Children Available Help at Discharge: Available 24 hours/day;Family Type of Home: House Home Access: Level entry     Home Layout: Two level;Able to live on main level with bedroom/bathroom Home Equipment: Gilford Rile - 2 wheels;Walker - 4 wheels;Cane - single point;Shower seat;Bedside commode;Hospital bed      Prior Function Level of Independence: Independent with assistive device(s)         Comments: household and short distanced community ambulator with [facility] PR[facility]     Hand Dominance   Dominant Hand: Right    Extremity/Trunk Assessment   Upper Extremity Assessment Upper Extremity Assessment: Defer to OT evaluation    Lower Extremity Assessment Lower Extremity Assessment: Overall WFL for tasks assessed    Cervical / Trunk Assessment Cervical / Trunk Assessment: Normal  Communication   Communication: No difficulties  Cognition Arousal/Alertness: Awake/alert Behavior During Therapy: WFL for tasks assessed/performed Overall Cognitive Status: Within Functional Limits for tasks assessed  General Comments      Exercises     Assessment/Plan    PT Assessment Patent does not need any further PT services  PT Problem List         PT Treatment Interventions      PT Goals (Current goals can be found in the Care Plan section)  Acute Rehab PT Goals Patient Stated Goal: return home PT Goal Formulation: With patient Time For Goal Achievement: 02/15/19 Potential to Achieve Goals: Good    Frequency     Barriers to discharge         Co-evaluation               AM-PAC PT "6 Clicks" Mobility  Outcome Measure Help needed turning from your back to your side while in a flat bed without using bedrails?: None Help needed moving from lying on your back to sitting on the side of a flat bed without using bedrails?: None Help needed moving to and from a bed to a chair (including a wheelchair)?: None Help needed standing up from a chair using your arms (e.g., wheelchair or bedside chair)?: None Help needed to walk in hospital room?: None Help needed climbing 3-5 steps with a railing? : None 6 Click Score: 24    End of Session   Activity Tolerance: Patient tolerated treatment well;Patient limited by fatigue Patient left: with call bell/phone within reach Nurse Communication: Mobility status PT Visit Diagnosis: Unsteadiness on feet (R26.81);Other abnormalities of gait and mobility (R26.89);Muscle weakness (generalized) (M62.81)    Time: 5784-6962 PT Time Calculation (min) (ACUTE ONLY): 23 min   Charges:   PT Evaluation $PT Eval Moderate Complexity: 1 Mod PT Treatments $Gait Training: 23-37 mins        9:55 AM, 02/15/19 Lonell Grandchild, MPT Physical Therapist with [facility] 336 [phone] office [phone] mobile phone

## 2019-02-15 NOTE — Progress Notes (Signed)
Code stroke  ER Called  2143 [address]   2147 In CT   2150 Out of CT  2154 [facility]   2154 Radiologist Called 2154

## 2019-02-15 NOTE — Progress Notes (Signed)
*  PRELIMINARY RESULTS* Echocardiogram 2D Echocardiogram has been performed with saline bubble study.  Samuel Germany 02/15/2019, 11:11 AM

## 2019-02-15 NOTE — ED Notes (Signed)
Patient transported to X-ray 

## 2019-02-15 NOTE — Progress Notes (Signed)
OT Cancellation Note  Patient Details Name: Brittany Goodman MRN: [id number] DOB: 1945/12/16   Cancelled Treatment:    Reason Eval/Treat Not Completed: Patient at procedure or test/ unavailable. Pt unavailable for OT evaluation as she is at a procedure. Per PT evaluation, patient is functioning at Modified independence level and no follow up PT was needed. Per patient's chart and PT evaluation, patient does not require any additional follow up OT services. No further OT services needed at this time and will sign off.    Ailene Ravel, OTR/L,CBIS  [phone]  02/15/2019, 11:47 AM

## 2019-02-15 NOTE — Progress Notes (Signed)
Admitted to room around 1300 from CCU.  Alert and oriented and no difficulty in speaking or finding words which she said was her original problem.  Answered all questions appropriately and showed no physical deficits. Stroke education completed. Feeding self lunch now

## 2019-02-15 NOTE — Care Management Important Message (Signed)
Important Message  Patient Details  Name: Brittany Goodman MRN: [id number] Date of Birth: 1946/10/31   Medicare Important Message Given:  Yes    Tommy Medal 02/15/2019, 2:46 PM

## 2019-02-15 NOTE — Telephone Encounter (Signed)
-----   Message from Erma Heritage, [place] sent at 02/15/2019  1:01 PM EDT ----- Regarding: 30-day Monitor This patient needs a 30-day event monitor for CVA per Dr. Manuella Ghazi ([facility]). Being discharged today.   Thanks,  [place]

## 2019-02-15 NOTE — Discharge Summary (Signed)
Physician Discharge Summary  Brittany Goodman GPQ:[id number] DOB: 05-24-1946 DOA: 02/14/2019  PCP: [name], MD  Admit date: 02/14/2019  Discharge date: 02/15/2019  Admitted From:Home  Disposition:  Home  Recommendations for Outpatient Follow-up:  1. Follow up with PCP in 1-2 weeks 2. Follow-up with Dr. Merlene Laughter of neurology in 4 to 6 weeks 3. Continue on aspirin and Plavix as prescribed along with statin as tolerated 4. Patient will receive cardiac monitor at home for close monitoring over 30 days  Home Health: None  Equipment/Devices: None  Discharge Condition: Stable  CODE STATUS: Full  Diet recommendation: Heart Healthy  Brief/Interim Summary: Per HPI: This 73 year old woman was last seen normal at 1700. Family founder sometime later with was described initially slurred speech but she was aphasic when she arrived at the emergency department. She had no weakness in the extremities. She has a history of hypertension but no diabetes, coronary disease, or atrial fibrillation. She has hyperlipidemia. She is not on antiplatelet or anticoagulant medication. She was said to have had a small stroke in April 2019.  CT head showed no acute hemorrhage or acute core infarct.  Patient underwent further testing with brain MRI/MRA with findings of small 5 mm right parietal CVA that is acute in nature.  2D echocardiogram with EF greater than 65% and negative findings on bubble study.  Carotid ultrasounds with no hemodynamically significant stenoses noted.  Patient was also seen by PT with no concerns for PT/OT needs.  Discussed case with Dr. Merlene Laughter of neurology who recommends the patient remain on aspirin and Plavix as well as statin and follow-up in 4 to [redacted] weeks along with 30-day cardiac event monitor which will be sent to her home.  No other acute events noted during this brief admission.  Discharge Diagnoses:  Active Problems:   HYPERTENSION, BENIGN ESSENTIAL   GERD (gastroesophageal  reflux disease)   Cerebrovascular disease   Diverticulosis of colon with hemorrhage   CVA (cerebral vascular accident) ([place])   Depression   OSA (obstructive sleep apnea)   Spinal stenosis   Diabetes mellitus type 2, uncontrolled, with complications ([place])  Principal discharge diagnosis: Acute right parietal CVA.  Discharge Instructions  Discharge Instructions    Diet - low sodium heart healthy   Complete by:  As directed    Increase activity slowly   Complete by:  As directed      Allergies as of 02/15/2019      Reactions   Celecoxib Swelling   Pt states she can tolerate ibuprofen   Codeine Other (See Comments)   "makes me feel bad"   Dilaudid [hydromorphone Hcl] Other (See Comments)   hallucination   Morphine Nausea And Vomiting   Statins    Muscle pain   Tetracycline Nausea And Vomiting      Medication List    TAKE these medications   acetaminophen 500 MG tablet Commonly known as:  TYLENOL Take 1,000 mg by mouth 2 (two) times daily as needed for mild pain or moderate pain.   aspirin EC 81 MG tablet Take 1 tablet (81 mg total) by mouth daily.   atenolol 100 MG tablet Commonly known as:  TENORMIN Take 100 mg by mouth every morning.   Biotin 5000 MCG Tabs Take 1 tablet by mouth daily.   citalopram 20 MG tablet Commonly known as:  CELEXA Take 20 mg by mouth every other day.   clopidogrel 75 MG tablet Commonly known as:  Plavix Take 1 tablet (75 mg total) by  mouth daily.   cyclobenzaprine 10 MG tablet Commonly known as:  FLEXERIL Take 10 mg by mouth 2 (two) times daily as needed for muscle spasms.   folic acid 163 MCG tablet Commonly known as:  FOLVITE Take 800 mcg by mouth daily.   glimepiride 1 MG tablet Commonly known as:  AMARYL Take 1 mg by mouth daily before breakfast.   HYDROcodone-acetaminophen 5-325 MG tablet Commonly known as:  NORCO/VICODIN Take 1 tablet by mouth every 4 (four) hours as needed for moderate pain.   levothyroxine 88 MCG  tablet Commonly known as:  SYNTHROID, LEVOTHROID Take 88 mcg by mouth daily before breakfast.   lisinopril 10 MG tablet Commonly known as:  PRINIVIL,ZESTRIL Take 1 tablet (10 mg total) by mouth daily for 30 days.   loratadine 10 MG tablet Commonly known as:  CLARITIN Take 10 mg by mouth daily.   metFORMIN 1000 MG tablet Commonly known as:  GLUCOPHAGE Take 1,000 mg by mouth 2 (two) times daily with a meal.   NONFORMULARY OR COMPOUNDED ITEM Apply 1 application topically 3 (three) times daily as needed (Pain). Diclofenac, Baclofen, Gabapentin, Lidocaine, Menthol - Compounded at [facility]   omeprazole 20 MG capsule Commonly known as:  PRILOSEC Take 20 mg by mouth daily.   polyethylene glycol packet Commonly known as:  MIRALAX / GLYCOLAX Take 17 g by mouth daily as needed for moderate constipation or severe constipation.   simvastatin 10 MG tablet Commonly known as:  ZOCOR Take 1 tablet (10 mg total) by mouth daily at 6 PM for 30 days. Start taking on:  February 16, 2019   vitamin B-12 1000 MCG tablet Commonly known as:  CYANOCOBALAMIN Take 1,000 mcg by mouth daily.   vitamin C 1000 MG tablet Take 1,000-2,000 mg by mouth daily.   Vitamin D 125 MCG (5000 UT) Caps Take 1 capsule by mouth daily.      Follow-up Information    [name], MD Follow up in 2 week(s).   Specialty:  Family Medicine Contact information: [address] [place] [address] [phone]        Phillips Odor, MD Follow up in 4 week(s).   Specialty:  Neurology Contact information: [address] [place] [address] [phone]          Allergies  Allergen Reactions  . Celecoxib Swelling    Pt states she can tolerate ibuprofen  . Codeine Other (See Comments)    "makes me feel bad"  . Dilaudid [Hydromorphone Hcl] Other (See Comments)    hallucination  . Morphine Nausea And Vomiting  . Statins     Muscle pain  . Tetracycline Nausea And Vomiting     Consultations:  Neurology on phone   Procedures/Studies: Dg Chest 2 View  Result Date: 02/15/2019 CLINICAL DATA:  Initial evaluation for acute stroke. EXAM: CHEST - 2 VIEW COMPARISON:  Prior radiograph from 11/28/2018. FINDINGS: Cardiac and mediastinal silhouettes within normal limits. Aortic atherosclerosis. Lungs mildly hypoinflated. No focal infiltrates. No pulmonary edema or pleural effusion. No pneumothorax. No acute osseous finding. Degenerative changes noted about the shoulders. IMPRESSION: 1. No active cardiopulmonary disease. 2. Aortic atherosclerosis. Electronically Signed   By: Jeannine Boga M.D.   On: 02/15/2019 01:35   Ct Chest Wo Contrast  Result Date: 01/31/2019 CLINICAL DATA:  Follow-up pulmonary nodule, left upper lobe EXAM: CT CHEST WITHOUT CONTRAST TECHNIQUE: Multidetector CT imaging of the chest was performed following the standard protocol without IV contrast. COMPARISON:  Chest radiograph, 11/28/2018, CT chest,  09/01/2018, 03/20/2014 FINDINGS: Cardiovascular: Coronary artery calcifications and/or stents. Normal heart size. No pericardial effusion. Aortic atherosclerosis. Mediastinum/Nodes: No enlarged mediastinal, hilar, or axillary lymph nodes. Thyroid gland, trachea, and esophagus demonstrate no significant findings. Lungs/Pleura: Stable or minimally decreased size of an irregular nodule of the left pulmonary apex, measuring approximately 1.5 x 1.1 cm (series 8, image 22). New ground-glass opacities of the lateral right upper lobe, likely infectious or inflammatory and measuring up to 2.7 cm (series 8, image 67). No pleural effusion or pneumothorax. Upper Abdomen: No acute abnormality. Musculoskeletal: No chest wall mass or suspicious bone lesions identified. IMPRESSION: 1. Stable or minimally decreased size of an irregular nodule of the left pulmonary apex, measuring approximately 1.5 x 1.1 cm (series 8, image 22). 2. New ground-glass opacities of the lateral right  upper lobe, likely infectious or inflammatory and measuring up to 2.7 cm (series 8, image 67). 3.  Coronary artery disease. Electronically Signed   By: Eddie Candle M.D.   On: 01/31/2019 15:23   Mr Brain Wo Contrast  Result Date: 02/15/2019 CLINICAL DATA:  Focal neuro deficit. Difficulty with speech. Expressive aphasia. EXAM: MRI HEAD WITHOUT CONTRAST TECHNIQUE: Multiplanar, multiecho pulse sequences of the brain and surrounding structures were obtained without intravenous contrast. COMPARISON:  CT head 02/14/2019 FINDINGS: Brain: 5 mm acute infarct right parietal operculum in the cortex. Otherwise no acute infarct. Ventricle size normal. Mild chronic white matter changes. Brainstem and cerebellum normal. Negative for hemorrhage or mass. Vascular: Normal arterial flow voids Skull and upper cervical spine: Negative Sinuses/Orbits: Mild mucosal edema paranasal sinuses. Other: None IMPRESSION: Small 5 mm cute infarct right parietal operculum. Otherwise no acute abnormality. Mild chronic microvascular ischemic changes in the white matter. Electronically Signed   By: Franchot Gallo M.D.   On: 02/15/2019 09:15   US Carotid Bilateral (at [facility] Only)  Result Date: 02/15/2019 CLINICAL DATA:  73 year old female with stroke EXAM: BILATERAL CAROTID DUPLEX ULTRASOUND TECHNIQUE: Pearline Cables scale imaging, color Doppler and duplex ultrasound were performed of bilateral carotid and vertebral arteries in the neck. COMPARISON:  None. FINDINGS: Criteria: Quantification of carotid stenosis is based on velocity parameters that correlate the residual internal carotid diameter with NASCET-based stenosis levels, using the diameter of the distal internal carotid lumen as the denominator for stenosis measurement. The following velocity measurements were obtained: RIGHT ICA:  Systolic 92 cm/sec, Diastolic 23 cm/sec CCA:  71 cm/sec SYSTOLIC ICA/CCA RATIO:  1.3 ECA:  111 cm/sec LEFT ICA:  Systolic 88 cm/sec, Diastolic 25 cm/sec CCA:  78  cm/sec SYSTOLIC ICA/CCA RATIO:  1.1 ECA:  71 cm/sec Right Brachial SBP: Not acquired Left Brachial SBP: Not acquired RIGHT CAROTID ARTERY: No significant calcifications of the right common carotid artery. Intermediate waveform maintained. Heterogeneous and partially calcified plaque at the right carotid bifurcation. No significant lumen shadowing. Low resistance waveform of the right ICA. No significant tortuosity. RIGHT VERTEBRAL ARTERY: Antegrade flow with low resistance waveform. LEFT CAROTID ARTERY: No significant calcifications of the left common carotid artery. Intermediate waveform maintained. Heterogeneous and partially calcified plaque at the left carotid bifurcation without significant lumen shadowing. Low resistance waveform of the left ICA. No significant tortuosity. LEFT VERTEBRAL ARTERY:  Antegrade flow with low resistance waveform. IMPRESSION: Color duplex indicates minimal heterogeneous and calcified plaque, with no hemodynamically significant stenosis by duplex criteria in the extracranial cerebrovascular circulation. Signed, Dulcy Fanny. Dellia Nims, RPVI [facility] Electronically Signed   By: Corrie Mckusick D.O.   On: 02/15/2019  12:21   Mr Jodene Nam Head/brain CB Cm  Result Date: 02/15/2019 CLINICAL DATA:  Stroke.  Aphasia EXAM: MRA HEAD WITHOUT CONTRAST TECHNIQUE: Angiographic images of the Circle of Willis were obtained using MRA technique without intravenous contrast. COMPARISON:  MRI report 05/2019 FINDINGS: Internal carotid artery widely patent. Anterior and middle cerebral arteries patent without occlusion. Moderate stenosis right A2 segment. Moderate stenosis right MCA bifurcation. Left MCA patent without significant stenosis. No aneurysm Both vertebral arteries patent to the basilar. Right PICA patent. Left PICA not visualized. Basilar widely patent. Superior cerebellar and posterior cerebral arteries patent bilaterally without stenosis.  No aneurysm. IMPRESSION: Negative for large vessel occlusion Moderate stenosis right A2 segment. Moderate stenosis right MCA bifurcation. Electronically Signed   By: Franchot Gallo M.D.   On: 02/15/2019 09:21   Ct Head Code Stroke Wo Contrast  Result Date: 02/14/2019 CLINICAL DATA:  Code stroke. 73 year old female with sudden onset weakness and slurred speech. EXAM: CT HEAD WITHOUT CONTRAST TECHNIQUE: Contiguous axial images were obtained from the base of the skull through the vertex without intravenous contrast. COMPARISON:  11/28/2018 head CT. FINDINGS: Brain: Cerebral volume is within normal limits for age. No midline shift, ventriculomegaly, mass effect, evidence of mass lesion, intracranial hemorrhage or evidence of cortically based acute infarction. Gray-white matter differentiation is within normal limits throughout the brain. No cortical encephalomalacia identified. Vascular: Mild Calcified atherosclerosis at the skull base. No suspicious intracranial vascular hyperdensity. Skull: No acute osseous abnormality identified. Sinuses/Orbits: Chronic right sphenoid sinus disease. Other Visualized paranasal sinuses and mastoids are stable and well pneumatized. Other: Visualized orbits and scalp soft tissues are within normal limits. ASPECTS ([place] Stroke Program Early CT Score) - Ganglionic level infarction (caudate, lentiform nuclei, internal capsule, insula, M1-M3 cortex): 7 - Supraganglionic infarction (M4-M6 cortex): 3 Total score (0-10 with 10 being normal): 10 IMPRESSION: 1. Stable and normal for age noncontrast CT appearance of the brain. ASPECTS is 10. 2. Chronic right sphenoid sinuses. Study discussed by telephone with Dr. Fredia Sorrow on 02/14/2019 at 21:59 . Electronically Signed   By: Genevie Ann M.D.   On: 02/14/2019 21:59     Discharge Exam: Vitals:   02/15/19 1107 02/15/19 1120  BP: 128/76   Pulse:    Resp: (!) 24   Temp:  97.6 F (36.4 C)  SpO2:     Vitals:   02/15/19 0745  02/15/19 0845 02/15/19 1107 02/15/19 1120  BP:  130/86 128/76   Pulse: 76     Resp:  18 (!) 24   Temp:    97.6 F (36.4 C)  TempSrc:    Oral  SpO2: 97%     Weight:      Height:        General: Pt is alert, awake, not in acute distress Cardiovascular: RRR, S1/S2 +, no rubs, no gallops Respiratory: CTA bilaterally, no wheezing, no rhonchi Abdominal: Soft, NT, ND, bowel sounds + Extremities: no edema, no cyanosis    The results of significant diagnostics from this hospitalization (including imaging, microbiology, ancillary and laboratory) are listed below for reference.     Microbiology: Recent Results (from the past 240 hour(s))  MRSA PCR Screening     Status: None   Collection Time: 02/15/19  2:21 AM  Result Value Ref Range Status   MRSA by PCR NEGATIVE NEGATIVE Final    Comment:        The GeneXpert MRSA Assay (FDA approved for NASAL specimens only), is one component of a comprehensive MRSA colonization surveillance  program. It is not intended to diagnose MRSA infection nor to guide or monitor treatment for MRSA infections. Performed at [facility], [address] [place], [place] [address]      Labs: BNP (last 3 results) No results for input(s): BNP in the last 8760 hours. Basic Metabolic Panel: Recent Labs  Lab 02/14/19 2147 02/15/19 0113  NA 125* 127*  K 4.0 4.7  CL 91* 91*  CO2 21* 24  GLUCOSE 134* 135*  BUN 9 8  CREATININE 0.67 0.70  CALCIUM 9.8 9.5   Liver Function Tests: Recent Labs  Lab 02/14/19 2147 02/15/19 0113  AST 17 14*  ALT 13 12  ALKPHOS 51 45  BILITOT 0.4 0.5  PROT 8.0 7.2  ALBUMIN 4.4 4.0   No results for input(s): LIPASE, AMYLASE in the last 168 hours. No results for input(s): AMMONIA in the last 168 hours. CBC: Recent Labs  Lab 02/14/19 2147  WBC 9.4  NEUTROABS 4.8  HGB 13.2  HCT 40.7  MCV 81.1  PLT 259   Cardiac Enzymes: No results for input(s): CKTOTAL, CKMB, CKMBINDEX, TROPONINI in the last 168  hours. BNP: Invalid input(s): POCBNP CBG: Recent Labs  Lab 02/14/19 2159 02/15/19 0317 02/15/19 0731 02/15/19 1113  GLUCAP 136* 147* 129* 129*   D-Dimer No results for input(s): DDIMER in the last 72 hours. Hgb A1c Recent Labs    02/15/19 0113  HGBA1C 5.9*   Lipid Profile Recent Labs    02/15/19 0113  CHOL 286*  HDL 70  LDLCALC 188*  TRIG 139  CHOLHDL 4.1   Thyroid function studies Recent Labs    02/15/19 0113  TSH 8.229*   Anemia work up No results for input(s): VITAMINB12, FOLATE, FERRITIN, TIBC, IRON, RETICCTPCT in the last 72 hours. Urinalysis    Component Value Date/Time   COLORURINE YELLOW 11/28/2018 2223   APPEARANCEUR CLEAR 11/28/2018 2223   LABSPEC 1.011 11/28/2018 2223   PHURINE 6.0 11/28/2018 2223   GLUCOSEU NEGATIVE 11/28/2018 2223   HGBUR NEGATIVE 11/28/2018 2223   BILIRUBINUR NEGATIVE 11/28/2018 2223   KETONESUR NEGATIVE 11/28/2018 2223   PROTEINUR NEGATIVE 11/28/2018 2223   UROBILINOGEN 0.2 05/27/2010 0005   NITRITE NEGATIVE 11/28/2018 2223   LEUKOCYTESUR NEGATIVE 11/28/2018 2223   Sepsis Labs Invalid input(s): PROCALCITONIN,  WBC,  LACTICIDVEN Microbiology Recent Results (from the past 240 hour(s))  MRSA PCR Screening     Status: None   Collection Time: 02/15/19  2:21 AM  Result Value Ref Range Status   MRSA by PCR NEGATIVE NEGATIVE Final    Comment:        The GeneXpert MRSA Assay (FDA approved for NASAL specimens only), is one component of a comprehensive MRSA colonization surveillance program. It is not intended to diagnose MRSA infection nor to guide or monitor treatment for MRSA infections. Performed at [facility], [address], [place], [place] [address]      Time coordinating discharge: 35 minutes  SIGNED:   Rodena Goldmann, DO [facility] 02/15/2019, 1:06 PM  If 7PM-7AM, please contact night-coverage [email or web address] Password [email or web address]

## 2019-02-18 ENCOUNTER — Ambulatory Visit: Payer: Medicare Other | Admitting: Nurse Practitioner

## 2019-02-21 DIAGNOSIS — I639 Cerebral infarction, unspecified: Secondary | ICD-10-CM | POA: Diagnosis not present

## 2019-02-21 DIAGNOSIS — Z6828 Body mass index (BMI) 28.0-28.9, adult: Secondary | ICD-10-CM | POA: Diagnosis not present

## 2019-02-21 DIAGNOSIS — E039 Hypothyroidism, unspecified: Secondary | ICD-10-CM | POA: Diagnosis not present

## 2019-02-21 DIAGNOSIS — E7849 Other hyperlipidemia: Secondary | ICD-10-CM | POA: Diagnosis not present

## 2019-02-26 ENCOUNTER — Ambulatory Visit: Payer: Medicare Other | Admitting: Nurse Practitioner

## 2019-03-04 ENCOUNTER — Ambulatory Visit (INDEPENDENT_AMBULATORY_CARE_PROVIDER_SITE_OTHER): Payer: Medicare Other

## 2019-03-04 DIAGNOSIS — I639 Cerebral infarction, unspecified: Secondary | ICD-10-CM | POA: Diagnosis not present

## 2019-03-04 DIAGNOSIS — G464 Cerebellar stroke syndrome: Secondary | ICD-10-CM | POA: Diagnosis not present

## 2019-04-01 DIAGNOSIS — E7849 Other hyperlipidemia: Secondary | ICD-10-CM | POA: Diagnosis not present

## 2019-04-01 DIAGNOSIS — E663 Overweight: Secondary | ICD-10-CM | POA: Diagnosis not present

## 2019-04-01 DIAGNOSIS — Z1389 Encounter for screening for other disorder: Secondary | ICD-10-CM | POA: Diagnosis not present

## 2019-04-01 DIAGNOSIS — Z6828 Body mass index (BMI) 28.0-28.9, adult: Secondary | ICD-10-CM | POA: Diagnosis not present

## 2019-04-01 DIAGNOSIS — I639 Cerebral infarction, unspecified: Secondary | ICD-10-CM | POA: Diagnosis not present

## 2019-04-01 DIAGNOSIS — E039 Hypothyroidism, unspecified: Secondary | ICD-10-CM | POA: Diagnosis not present

## 2019-04-18 DIAGNOSIS — M1990 Unspecified osteoarthritis, unspecified site: Secondary | ICD-10-CM | POA: Diagnosis not present

## 2019-04-18 DIAGNOSIS — E7849 Other hyperlipidemia: Secondary | ICD-10-CM | POA: Diagnosis not present

## 2019-04-18 DIAGNOSIS — E119 Type 2 diabetes mellitus without complications: Secondary | ICD-10-CM | POA: Diagnosis not present

## 2019-05-21 DIAGNOSIS — E119 Type 2 diabetes mellitus without complications: Secondary | ICD-10-CM | POA: Diagnosis not present

## 2019-05-21 DIAGNOSIS — I10 Essential (primary) hypertension: Secondary | ICD-10-CM | POA: Diagnosis not present

## 2019-05-21 DIAGNOSIS — E7849 Other hyperlipidemia: Secondary | ICD-10-CM | POA: Diagnosis not present

## 2019-05-23 ENCOUNTER — Other Ambulatory Visit: Payer: Self-pay | Admitting: *Deleted

## 2019-05-23 DIAGNOSIS — R911 Solitary pulmonary nodule: Secondary | ICD-10-CM

## 2019-06-03 DIAGNOSIS — Z6829 Body mass index (BMI) 29.0-29.9, adult: Secondary | ICD-10-CM | POA: Diagnosis not present

## 2019-06-03 DIAGNOSIS — Z0001 Encounter for general adult medical examination with abnormal findings: Secondary | ICD-10-CM | POA: Diagnosis not present

## 2019-06-03 DIAGNOSIS — I10 Essential (primary) hypertension: Secondary | ICD-10-CM | POA: Diagnosis not present

## 2019-06-03 DIAGNOSIS — Z1389 Encounter for screening for other disorder: Secondary | ICD-10-CM | POA: Diagnosis not present

## 2019-06-03 DIAGNOSIS — E119 Type 2 diabetes mellitus without complications: Secondary | ICD-10-CM | POA: Diagnosis not present

## 2019-06-03 DIAGNOSIS — E039 Hypothyroidism, unspecified: Secondary | ICD-10-CM | POA: Diagnosis not present

## 2019-06-03 DIAGNOSIS — E7849 Other hyperlipidemia: Secondary | ICD-10-CM | POA: Diagnosis not present

## 2019-06-04 DIAGNOSIS — E119 Type 2 diabetes mellitus without complications: Secondary | ICD-10-CM | POA: Diagnosis not present

## 2019-06-04 DIAGNOSIS — Z6829 Body mass index (BMI) 29.0-29.9, adult: Secondary | ICD-10-CM | POA: Diagnosis not present

## 2019-06-04 DIAGNOSIS — E663 Overweight: Secondary | ICD-10-CM | POA: Diagnosis not present

## 2019-06-04 DIAGNOSIS — Z0001 Encounter for general adult medical examination with abnormal findings: Secondary | ICD-10-CM | POA: Diagnosis not present

## 2019-06-20 DIAGNOSIS — E119 Type 2 diabetes mellitus without complications: Secondary | ICD-10-CM | POA: Diagnosis not present

## 2019-06-20 DIAGNOSIS — E7849 Other hyperlipidemia: Secondary | ICD-10-CM | POA: Diagnosis not present

## 2019-06-20 DIAGNOSIS — M1990 Unspecified osteoarthritis, unspecified site: Secondary | ICD-10-CM | POA: Diagnosis not present

## 2019-07-18 ENCOUNTER — Ambulatory Visit: Payer: Medicare Other | Admitting: Cardiothoracic Surgery

## 2019-07-18 ENCOUNTER — Other Ambulatory Visit: Payer: Medicare Other

## 2019-07-18 NOTE — Progress Notes (Deleted)
[address]       [place] [address]             [phone]                    [name] [place] Medical Record #[id number] Date of Birth: 06/13/1946  Referring: [name], MD Primary Care: [name], MD Primary Cardiologist: No primary care provider on file.  Chief Complaint:    No chief complaint on file.   History of Present Illness:    Brittany Goodman 73 y.o. female is seen in the office  today for evaluation of left upper lobe lung abnormality.  Most recently the patient had a CT scan of the chest done on September 01, 2018.  This was precipitated by a visit to the emergency room when she noted extensive bruising along her left neck left anterior chest and down along the left side of her chest.  She denies any specific trauma.  She had been taking high-dose aspirin at.  This was treated without any specific intervention other than discontinuing the aspirin.  Incidentally a CT scan suggested an irregular mass in the left upper lobe.  She had a previous history of a left upper lobe mass that she says was followed for several years with CT scan.  On review of her history and films a CT scan from 2015 was located.  The report on that scan does not mention left upper lobe irregularity but on review of the film there is an area of irregular mass in the area of question that persists now.  It does appear slightly enlarged over 4 years.   Patient is a distant smoker having smoked for approximately 10 years and quitting in 1973.  She denies any history of other malignancy.    Current Activity/ Functional Status:  Patient is independent with mobility/ambulation, transfers, ADL's, IADL's.   Zubrod Score: At the time of surgery this patients most appropriate activity status/level should be described as: []     0    Normal activity, no symptoms [x]     1    Restricted in physical strenuous activity but ambulatory, able to do out light work []     2     Ambulatory and capable of self care, unable to do work activities, up and about               >50 % of waking hours                              []     3    Only limited self care, in bed greater than 50% of waking hours []     4    Completely disabled, no self care, confined to bed or chair []     5    Moribund   Past Medical History:  Diagnosis Date   Arthritis    CVA (cerebral vascular accident) ([place]) 02/14/2019   Depression    Depression 02/14/2019   Diabetes mellitus type 2, uncontrolled, with complications ([place]) 1/91/4782   Diabetes mellitus type II    Diverticulosis    GERD (gastroesophageal reflux disease)    Hyperlipidemia    Hypertension    Hypothyroidism    OSA (obstructive sleep apnea) 02/14/2019   Sepsis ([place])    2011, Escherichia coli pyelonephritis   Sleep apnea    Does not use CPAP.  Cannot tolerate   Spinal stenosis    Spinal stenosis 02/14/2019   Stroke [facility])    no deficits    Past Surgical History:  Procedure Laterality Date   BACK SURGERY     CARPAL TUNNEL RELEASE Right    COLONOSCOPY N/A 05/29/2013   Focal left colonic inflammation, likely remnant of recent bout of ischemic or segmental colitis, s/p biopsy. pancolonic diverticulosis. Due for surveillance in 5 years   COLONOSCOPY WITH PROPOFOL N/A 07/26/2018   Procedure: COLONOSCOPY WITH PROPOFOL;  Surgeon: Daneil Dolin, MD;  [facility];  Service: Endoscopy;  Laterality: N/A;  8:30am   ESOPHAGOGASTRODUODENOSCOPY (EGD) WITH ESOPHAGEAL DILATION N/A 05/29/2013   normal appearing patent tubular esophagus, small hiatal hernia, multiple antral erosions. no ulcer. normlal duodenum. empiric dilation   ILIOTIBIAL BAND RELEASE Left    POLYPECTOMY  07/26/2018   Procedure: POLYPECTOMY;  Surgeon: Daneil Dolin, MD;  Location: [facility];  Service: Endoscopy;;  hepatic flexure polyp   TOTAL ABDOMINAL HYSTERECTOMY      Family History  Problem Relation Age of Onset   Heart disease  Mother        MI   Heart disease Father    Heart attack Father 78   Heart disease Brother    Cancer Brother        ADRENAL GLAND   Colon polyps Brother    Liver cancer Brother 32       deceased , ?metastatic cancer?    Heart disease Maternal Grandfather        MI   Cancer Paternal Grandfather        LUNG   Colon cancer Neg Hx      Social History   Tobacco Use  Smoking Status Former Smoker   Packs/day: 2.00   Years: 13.00   Pack years: 26.00   Quit date: 07/19/1966   Years since quitting: 53.0  Smokeless Tobacco Former Systems developer   Types: Loss adjuster, chartered    Social History   Substance and Sexual Activity  Alcohol Use No     Allergies  Allergen Reactions   Celecoxib Swelling    Pt states she can tolerate ibuprofen   Codeine Other (See Comments)    "makes me feel bad"   Dilaudid [Hydromorphone Hcl] Other (See Comments)    hallucination   Morphine Nausea And Vomiting   Statins     Muscle pain   Tetracycline Nausea And Vomiting    Current Outpatient Medications  Medication Sig Dispense Refill   acetaminophen (TYLENOL) 500 MG tablet Take 1,000 mg by mouth 2 (two) times daily as needed for mild pain or moderate pain.      Ascorbic Acid (VITAMIN C) 1000 MG tablet Take 1,000-2,000 mg by mouth daily.      aspirin EC 81 MG tablet Take 1 tablet (81 mg total) by mouth daily. 150 tablet 2   atenolol (TENORMIN) 100 MG tablet Take 100 mg by mouth every morning.      Biotin 5000 MCG TABS Take 1 tablet by mouth daily.     Cholecalciferol (VITAMIN D) 125 MCG (5000 UT) CAPS Take 1 capsule by mouth daily.      citalopram (CELEXA) 20 MG tablet Take 20 mg by mouth every other day.      clopidogrel (PLAVIX) 75 MG tablet Take 1 tablet (75 mg total) by mouth daily. 30 tablet 11   cyclobenzaprine (FLEXERIL) 10 MG tablet Take 10 mg by mouth 2 (two) times daily as needed for  muscle spasms.      folic acid (FOLVITE) 338 MCG tablet Take 800 mcg by mouth daily.       glimepiride (AMARYL) 1 MG tablet Take 1 mg by mouth daily before breakfast.      HYDROcodone-acetaminophen (NORCO/VICODIN) 5-325 MG tablet Take 1 tablet by mouth every 4 (four) hours as needed for moderate pain.   0   levothyroxine (SYNTHROID, LEVOTHROID) 88 MCG tablet Take 88 mcg by mouth daily before breakfast.      lisinopril (PRINIVIL,ZESTRIL) 10 MG tablet Take 1 tablet (10 mg total) by mouth daily for 30 days. 30 tablet 0   loratadine (CLARITIN) 10 MG tablet Take 10 mg by mouth daily.     metFORMIN (GLUCOPHAGE) 1000 MG tablet Take 1,000 mg by mouth 2 (two) times daily with a meal.      NONFORMULARY OR COMPOUNDED ITEM Apply 1 application topically 3 (three) times daily as needed (Pain). Diclofenac, Baclofen, Gabapentin, Lidocaine, Menthol - Compounded at [facility]     omeprazole (PRILOSEC) 20 MG capsule Take 20 mg by mouth daily.      polyethylene glycol (MIRALAX / GLYCOLAX) packet Take 17 g by mouth daily as needed for moderate constipation or severe constipation. 30 each 0   simvastatin (ZOCOR) 10 MG tablet Take 1 tablet (10 mg total) by mouth daily at 6 PM for 30 days. 30 tablet 2   vitamin B-12 (CYANOCOBALAMIN) 1000 MCG tablet Take 1,000 mcg by mouth daily.     No current facility-administered medications for this visit.     Although listed on the patient's medicine profile she notes that since the bleeding and bruising episode along the left neck and left chest she has not taken any other aspirin.  Pertinent items are noted in HPI.   Review of Systems:     Cardiac Review of Systems: [Y] = yes  or   [ N ] = no   Chest Pain [ N ]  Resting SOB [ N ] Exertional SOB  [Y]  Orthopnea Aqua.Slicker  ]   Pedal Edema [ N   Palpitations N ] Syncope  [ N]   Presyncope [ N]   General Review of Systems: [Y] = yes [  ]=no Constitional: recent weight change [  ];  Wt loss over the last 3 months [   ] anorexia [  ]; fatigue [  ]; nausea [  ]; night sweats [  ]; fever [  ]; or chills [  ];            Eye : blurred vision [  ]; diplopia [   ]; vision changes [  ];  Amaurosis fugax[  ]; Resp: cough [  ];  wheezing[  ];  hemoptysis[  ]; shortness of breath[  ]; paroxysmal nocturnal dyspnea[  ]; dyspnea on exertion[  ]; or orthopnea[  ];  GI:  gallstones[  ], vomiting[  ];  dysphagia[  ]; melena[  ];  hematochezia [  ]; heartburn[  ];   Hx of  Colonoscopy[  ]; GU: kidney stones [  ]; hematuria[  ];   dysuria [  ];  nocturia[  ];  history of     obstruction [  ]; urinary frequency [  ]             Skin: rash, swelling[  ];, hair loss[  ];  peripheral edema[  ];  or itching[  ]; Musculosketetal: myalgias[  ];  joint swelling[  ];  joint erythema[  ];  joint pain[  ];  back pain[y  ];  Heme/Lymph: bruising[ y ];  bleeding[y  ];  anemia[  ];  Neuro: TIA[  ];  headaches[  ];  stroke[  ];  vertigo[  ];  seizures[  ];   paresthesias[  ];  difficulty walking[y  ];  Psych:depression[  ]; anxiety[  ];  Endocrine: diabetes[  ];  thyroid dysfunction[  ];  Immunizations: Flu up to date Jazmín.Cullens  ]; Pneumococcal up to date Jazmín.Cullens  ];  Other:     PHYSICAL EXAMINATION: There were no vitals taken for this visit. {physical exam:21449}  Diagnostic Studies & Laboratory data:     Recent Radiology Findings:  No results found. Ct Chest Wo Contrast  Result Date: 01/31/2019 CLINICAL DATA:  Follow-up pulmonary nodule, left upper lobe EXAM: CT CHEST WITHOUT CONTRAST TECHNIQUE: Multidetector CT imaging of the chest was performed following the standard protocol without IV contrast. COMPARISON:  Chest radiograph, 11/28/2018, CT chest, 09/01/2018, 03/20/2014 FINDINGS: Cardiovascular: Coronary artery calcifications and/or stents. Normal heart size. No pericardial effusion. Aortic atherosclerosis. Mediastinum/Nodes: No enlarged mediastinal, hilar, or axillary lymph nodes. Thyroid gland, trachea, and esophagus demonstrate no significant findings. Lungs/Pleura: Stable or minimally decreased size of an irregular nodule of the  left pulmonary apex, measuring approximately 1.5 x 1.1 cm (series 8, image 22). New ground-glass opacities of the lateral right upper lobe, likely infectious or inflammatory and measuring up to 2.7 cm (series 8, image 67). No pleural effusion or pneumothorax. Upper Abdomen: No acute abnormality. Musculoskeletal: No chest wall mass or suspicious bone lesions identified. IMPRESSION: 1. Stable or minimally decreased size of an irregular nodule of the left pulmonary apex, measuring approximately 1.5 x 1.1 cm (series 8, image 22). 2. New ground-glass opacities of the lateral right upper lobe, likely infectious or inflammatory and measuring up to 2.7 cm (series 8, image 67). 3.  Coronary artery disease. Electronically Signed   By: Eddie Candle M.D.   On: 01/31/2019 15:23   Ct Angio Abd/pel W/ And/or W/o  Result Date: 01/08/2019 CLINICAL DATA:  Generalized abdominal pain, diarrhea and bloody stools. EXAM: CT ANGIOGRAPHY ABDOMEN AND PELVIS WITH CONTRAST TECHNIQUE: Multidetector CT imaging of the abdomen and pelvis was performed using the standard protocol during bolus administration of intravenous contrast. Multiplanar reconstructed images and MIPs were obtained and reviewed to evaluate the vascular anatomy. CONTRAST:  119m ISOVUE-370 IOPAMIDOL (ISOVUE-370) INJECTION 76% COMPARISON:  CT of the abdomen and pelvis with contrast on 11/28/2018 and CT of the chest on 09/01/2018. CTA of the abdomen and pelvis on 06/08/2018 FINDINGS: VASCULAR Aorta: Scattered atherosclerotic plaque and tortuosity without evidence of aneurysm or significant stenosis. Celiac: Heavily calcified plaque at the celiac origin likely causes significant stenosis of greater than 70%. This appears stable since July, 2019. The celiac trunk itself remains open with normal patency of distal branch vessels. SMA: Calcified plaque at origin without evidence of significant stenosis. There is some additional noncalcified plaque in the proximal trunk of the  superior mesenteric artery that causes approximately 40% non critical narrowing. This appears stable since the prior CTA in July, 2019. Renals: Bilateral single renal arteries present. Calcified and noncalcified plaque in the proximal right renal artery does not cause significant stenosis. 50-60% narrowing at the origin of the left renal artery appears stable. IMA: Normally patent. Inflow: Scattered calcified plaque in bilateral iliac arteries without significant stenosis or evidence of aneurysmal disease. Proximal Outflow: Bilateral common femoral arteries and femoral bifurcations are normally  patent. Veins: Venous phase imaging was also performed through the entire abdomen and pelvis demonstrating no evidence of mesenteric venous, portal vein or splenic vein thrombus. The IVC, iliac veins and common femoral veins are normally patent. Bilateral renal veins are widely patent. Review of the MIP images confirms the above findings. NON-VASCULAR Lower chest: Potentially new area sub-solid ground-glass opacity of the right lung which is not completely imaged and lies just superior to the minor fissure at the base of the right upper lobe and measures on the order of 1.8-1.9 cm. This is not completely evaluated. As this was not present 4 months ago, this may represent an area of atelectasis rather than a new pulmonary lesion. Focal area of pneumonia can not be excluded. Hepatobiliary: No focal liver abnormality is seen. No gallstones, gallbladder wall thickening, or biliary dilatation. Pancreas: Unremarkable. No pancreatic ductal dilatation or surrounding inflammatory changes. Spleen: Normal in size without focal abnormality. Adrenals/Urinary Tract: Adrenal glands are unremarkable. Kidneys are normal, without renal calculi, focal lesion, or hydronephrosis. Bladder is unremarkable. Stomach/Bowel: Previously noted acute colitis involving a long segment of the colon from roughly the mid transverse to rectal levels has now  resolved with no significant residual inflammation remaining. There is suggestion, however of potential distal rectal wall thickening. Definition of the rectum is not very clear by CT as it emerges with the vagina anteriorly. Rectal mass is not excluded and correlation is suggested with rectal exam. No evidence of bowel obstruction, ileus or free air. The appendix is unremarkable. Lymphatic: No enlarged lymph nodes identified in the abdomen or pelvis. Reproductive: Status post hysterectomy. No adnexal masses. The residual vaginal cavity appears irregularly thickened towards the pelvic floor. This appears more prominent compared to prior CT studies, including the study in July of 2019. Correlation suggested with gynecologic exam as a vaginal mass cannot be excluded. Other: No hernias are identified. No ascites or focal abscess identified. 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 No fractures or bony lesions identified. IMPRESSION: VASCULAR 1. 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 2. Stable mild stenosis of the proximal SMA. 3. 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 NON-VASCULAR 1. New area of pulmonary opacity measuring approximately 1.8-1.9 cm and having a ground-glass sub solid appearance in the inferior right upper lobe just above the minor fissure. This was not present by chest CT 4 months ago which would argue for a non neoplastic process such as atelectasis or infection. 2. Essential resolution of previously noted colitis involving a long segment of the colon with potential residual thickening of the rectum. Evaluation of the rectum is limited by CT and correlation is suggested with rectal exam as underlying mass can not be excluded. 3. Irregular thickened appearance of the vaginal cavity which appears more prominent compared to prior CT. Recommend correlation  with gynecologic exam as a vaginal mass cannot be excluded. Electronically Signed   By: Aletta Edouard M.D.   On: 01/08/2019 20:53    CLINICAL DATA:  Left shoulder hematoma.  No known injury.  EXAM: CT CHEST WITH CONTRAST  TECHNIQUE: Multidetector CT imaging of the chest was performed during intravenous contrast administration.  CONTRAST:  56m OMNIPAQUE IOHEXOL 300 MG/ML  SOLN  COMPARISON:  CT chest 03/20/2014  FINDINGS: Cardiovascular: Normal heart size. Coronary arterial vascular calcifications. Thoracic aortic vascular calcifications.  Mediastinum/Nodes: No enlarged axillary, mediastinal or hilar lymphadenopathy. Normal appearance of the  esophagus.  Lungs/Pleura: Central airways are patent. Dependent atelectasis/scarring within the bilateral lower lobes. 1.7 x 1.2 cm irregular sub solid nodule left lung apex (image 21; series 4), previously measuring 0.7 x 1.0 cm. No large area of pulmonary consolidation. No pleural effusion or pneumothorax.  Upper Abdomen: No acute process.  Musculoskeletal: Thoracic spine degenerative changes. No aggressive or acute appearing osseous lesions.  IMPRESSION: 1. There is a 1.7 cm enlarging sub solid nodule within the left lung apex. Possibility of slow growing malignancy should be considered. Recommend multi disciplinary thoracic oncology consultation. 2. No acute process within the chest. thank 3. Aortic Atherosclerosis (ICD10-I70.0).   Electronically Signed   By: Lovey Newcomer M.D.   On: 09/01/2018 20:33  Scan from 2015: no mention of left  upper lobe lesion that is present    I have independently reviewed the above radiology studies  and reviewed the findings with the patient.  08/2018:   02/2014:   Recent Lab Findings: Lab Results  Component Value Date   WBC 9.4 02/14/2019   HGB 13.2 02/14/2019   HCT 40.7 02/14/2019   PLT 259 02/14/2019   GLUCOSE 135 (H) 02/15/2019   CHOL 286 (H) 02/15/2019   TRIG 139  02/15/2019   HDL 70 02/15/2019   LDLCALC 188 (H) 02/15/2019   ALT 12 02/15/2019   AST 14 (L) 02/15/2019   NA 127 (L) 02/15/2019   K 4.7 02/15/2019   CL 91 (L) 02/15/2019   CREATININE 0.70 02/15/2019   BUN 8 02/15/2019   CO2 24 02/15/2019   TSH 8.229 (H) 02/15/2019   INR 0.9 02/14/2019   HGBA1C 5.9 (H) 02/15/2019      Assessment / Plan:   Patient has a persistent parenchymal irregularity left upper lobe, that was present in 2015 and is incidentally noted again in 2019 when she obtain a CT scan for spontaneous bruising along the left neck and chest.  The area in question does appear to have enlarged very slightly over the 4 years.  The mass is indeterminate as if it is malignant or not but if it is it is changing slowly Follow-up CT scan today compared to October 2019 shows stable to slightly decrease in size of the left upper lobe nodule  I discussed with the patient obtaining a a follow-up CT scan in 4 months .  If we document continued steady enlargement will obtain a PET scan and needle biopsy.  The patient would be a poor candidate for surgical lung resection.    Grace Isaac MD      [address] [place] [address] Office [phone]   Beeper [phone]  07/18/2019 10:05 AM

## 2019-07-22 DIAGNOSIS — E7849 Other hyperlipidemia: Secondary | ICD-10-CM | POA: Diagnosis not present

## 2019-07-22 DIAGNOSIS — E119 Type 2 diabetes mellitus without complications: Secondary | ICD-10-CM | POA: Diagnosis not present

## 2019-07-22 DIAGNOSIS — M1991 Primary osteoarthritis, unspecified site: Secondary | ICD-10-CM | POA: Diagnosis not present

## 2019-07-25 ENCOUNTER — Other Ambulatory Visit: Payer: Self-pay | Admitting: Cardiothoracic Surgery

## 2019-07-25 ENCOUNTER — Encounter: Payer: Self-pay | Admitting: Cardiothoracic Surgery

## 2019-07-25 ENCOUNTER — Other Ambulatory Visit: Payer: Self-pay

## 2019-07-25 ENCOUNTER — Ambulatory Visit
Admission: RE | Admit: 2019-07-25 | Discharge: 2019-07-25 | Disposition: A | Discharge status: Home / Self Care | Payer: Medicare Other | Source: Ambulatory Visit | Attending: Cardiothoracic Surgery | Admitting: Cardiothoracic Surgery

## 2019-07-25 ENCOUNTER — Ambulatory Visit (INDEPENDENT_AMBULATORY_CARE_PROVIDER_SITE_OTHER): Payer: Medicare Other | Admitting: Cardiothoracic Surgery

## 2019-07-25 DIAGNOSIS — R911 Solitary pulmonary nodule: Secondary | ICD-10-CM

## 2019-07-25 DIAGNOSIS — Z85118 Personal history of other malignant neoplasm of bronchus and lung: Secondary | ICD-10-CM | POA: Diagnosis not present

## 2019-07-25 MED ORDER — IOPAMIDOL (ISOVUE-370) INJECTION 76%
75.0000 mL | Freq: Once | INTRAVENOUS | Status: AC | PRN
  Administered 2019-07-25: 75 mL via INTRAVENOUS

## 2019-07-25 NOTE — Progress Notes (Signed)
[address]       [name] [address]             [phone]      Telehealth Visit     Virtual Visit via Telephone Note   This visit type was conducted due to national recommendations for restrictions regarding the COVID-19 Pandemic (e.g. social distancing) in an effort to limit this patient's exposure and mitigate transmission in our community.  Due to her co-morbid illnesses, this patient is at least at moderate risk for complications without adequate follow up.  This format is felt to be most appropriate for this patient at this time.  The patient did not have access to video technology/had technical difficulties with video requiring transitioning to audio format only (telephone).                     [name] #[id number] Date of Birth: 07-Feb-1946  Referring: [name], MD Primary Care: [name], MD Primary Cardiologist: No primary care provider on file.  Chief Complaint:    Follow-up CT scan of the chest for left upper lobe lung nodule   History of Present Illness:    Brittany Goodman 73 y.o. female had recent CT for evaluation of left upper lobe lung abnormality.and requested a virtual visit rather than coming to the office when it) this was precipitated by a visit to the emergency room when she noted extensive bruising along her left neck left anterior chest and down along the left side of her chest.  She denies any specific trauma.  She had been taking high-dose aspirin at.  This was treated without any specific intervention other than discontinuing the aspirin.  Incidentally a CT scan suggested an irregular mass in the left upper lobe.  She had a previous history of a left upper lobe mass that she says was followed for several years with CT scan.  On review of her history and films a CT scan from 2015 was located.  The report on that scan does not mention left upper lobe irregularity but on review of the film there is  an area of irregular mass in the area of question that persists now.  It does appear slightly enlarged over 4 years.   Patient is a distant smoker having smoked for approximately 10 years and quitting in 1973.  She denies any history of other malignancy.    Current Activity/ Functional Status:  Patient is independent with mobility/ambulation, transfers, ADL's, IADL's.   Zubrod Score: At the time of surgery this patients most appropriate activity status/level should be described as: []     0    Normal activity, no symptoms [x]     1    Restricted in physical strenuous activity but ambulatory, able to do out light work []     2    Ambulatory and capable of self care, unable to do work activities, up and about               >50 % of waking hours                              []     3    Only limited self care, in bed greater than 50% of waking hours []     4    Completely disabled, no self care, confined to bed or chair []   5    Moribund   Past Medical History:  Diagnosis Date   Arthritis    CVA (cerebral vascular accident) ([place]) 02/14/2019   Depression    Depression 02/14/2019   Diabetes mellitus type 2, uncontrolled, with complications ([name]) 07/15/538   Diabetes mellitus type II    Diverticulosis    GERD (gastroesophageal reflux disease)    Hyperlipidemia    Hypertension    Hypothyroidism    OSA (obstructive sleep apnea) 02/14/2019   Sepsis ([place])    2011, Escherichia coli pyelonephritis   Sleep apnea    Does not use CPAP. Cannot tolerate   Spinal stenosis    Spinal stenosis 02/14/2019   Stroke [facility])    no deficits    Past Surgical History:  Procedure Laterality Date   BACK SURGERY     CARPAL TUNNEL RELEASE Right    COLONOSCOPY N/A 05/29/2013   Focal left colonic inflammation, likely remnant of recent bout of ischemic or segmental colitis, s/p biopsy. pancolonic diverticulosis. Due for surveillance in 5 years   COLONOSCOPY WITH PROPOFOL N/A 07/26/2018    Procedure: COLONOSCOPY WITH PROPOFOL;  Surgeon: Daneil Dolin, MD;  [facility];  Service: Endoscopy;  Laterality: N/A;  8:30am   ESOPHAGOGASTRODUODENOSCOPY (EGD) WITH ESOPHAGEAL DILATION N/A 05/29/2013   normal appearing patent tubular esophagus, small hiatal hernia, multiple antral erosions. no ulcer. normlal duodenum. empiric dilation   ILIOTIBIAL BAND RELEASE Left    POLYPECTOMY  07/26/2018   Procedure: POLYPECTOMY;  Surgeon: Daneil Dolin, MD;  Location: [facility];  Service: Endoscopy;;  hepatic flexure polyp   TOTAL ABDOMINAL HYSTERECTOMY      Family History  Problem Relation Age of Onset   Heart disease Mother        MI   Heart disease Father    Heart attack Father 83   Heart disease Brother    Cancer Brother        ADRENAL GLAND   Colon polyps Brother    Liver cancer Brother 40       deceased , ?metastatic cancer?    Heart disease Maternal Grandfather        MI   Cancer Paternal Grandfather        LUNG   Colon cancer Neg Hx      Social History   Tobacco Use  Smoking Status Former Smoker   Packs/day: 2.00   Years: 13.00   Pack years: 26.00   Quit date: 07/19/1966   Years since quitting: 53.0  Smokeless Tobacco Former Systems developer   Types: Loss adjuster, chartered    Social History   Substance and Sexual Activity  Alcohol Use No     Allergies  Allergen Reactions   Celecoxib Swelling    Pt states she can tolerate ibuprofen   Codeine Other (See Comments)    "makes me feel bad"   Dilaudid [Hydromorphone Hcl] Other (See Comments)    hallucination   Morphine Nausea And Vomiting   Statins     Muscle pain   Tetracycline Nausea And Vomiting    Current Outpatient Medications  Medication Sig Dispense Refill   acetaminophen (TYLENOL) 500 MG tablet Take 1,000 mg by mouth 2 (two) times daily as needed for mild pain or moderate pain.      Ascorbic Acid (VITAMIN C) 1000 MG tablet Take 1,000-2,000 mg by mouth daily.      aspirin EC 81 MG tablet  Take 1 tablet (81 mg total) by mouth daily. 150 tablet 2  atenolol (TENORMIN) 100 MG tablet Take 100 mg by mouth every morning.      Biotin 5000 MCG TABS Take 1 tablet by mouth daily.     Cholecalciferol (VITAMIN D) 125 MCG (5000 UT) CAPS Take 1 capsule by mouth daily.      citalopram (CELEXA) 20 MG tablet Take 20 mg by mouth every other day.      clopidogrel (PLAVIX) 75 MG tablet Take 1 tablet (75 mg total) by mouth daily. 30 tablet 11   cyclobenzaprine (FLEXERIL) 10 MG tablet Take 10 mg by mouth 2 (two) times daily as needed for muscle spasms.      folic acid (FOLVITE) 726 MCG tablet Take 800 mcg by mouth daily.      glimepiride (AMARYL) 1 MG tablet Take 1 mg by mouth daily before breakfast.      HYDROcodone-acetaminophen (NORCO/VICODIN) 5-325 MG tablet Take 1 tablet by mouth every 4 (four) hours as needed for moderate pain.   0   levothyroxine (SYNTHROID, LEVOTHROID) 88 MCG tablet Take 88 mcg by mouth daily before breakfast.      lisinopril (PRINIVIL,ZESTRIL) 10 MG tablet Take 1 tablet (10 mg total) by mouth daily for 30 days. 30 tablet 0   loratadine (CLARITIN) 10 MG tablet Take 10 mg by mouth daily.     metFORMIN (GLUCOPHAGE) 1000 MG tablet Take 1,000 mg by mouth 2 (two) times daily with a meal.      NONFORMULARY OR COMPOUNDED ITEM Apply 1 application topically 3 (three) times daily as needed (Pain). Diclofenac, Baclofen, Gabapentin, Lidocaine, Menthol - Compounded at [facility]     omeprazole (PRILOSEC) 20 MG capsule Take 20 mg by mouth daily.      polyethylene glycol (MIRALAX / GLYCOLAX) packet Take 17 g by mouth daily as needed for moderate constipation or severe constipation. 30 each 0   simvastatin (ZOCOR) 10 MG tablet Take 1 tablet (10 mg total) by mouth daily at 6 PM for 30 days. 30 tablet 2   vitamin B-12 (CYANOCOBALAMIN) 1000 MCG tablet Take 1,000 mcg by mouth daily.     No current facility-administered medications for this visit.     Although listed  on the patient's medicine profile she notes that since the bleeding and bruising episode along the left neck and left chest she has not taken any other aspirin.  Pertinent items are noted in HPI.   Review of Systems:      PHYSICAL EXAMINATION: There were no vitals taken for this visit.   Diagnostic Studies & Laboratory data:     Recent Radiology Findings:  Ct Angio Chest Aorta W &/or Wo Contrast  Result Date: 07/25/2019 CLINICAL DATA:  73 year old female with history of non-small cell lung cancer. Follow-up study. EXAM: CT ANGIOGRAPHY CHEST WITH CONTRAST TECHNIQUE: Multidetector CT imaging of the chest was performed using the standard protocol during bolus administration of intravenous contrast. Multiplanar CT image reconstructions and MIPs were obtained to evaluate the vascular anatomy. CONTRAST:  29m ISOVUE-370 IOPAMIDOL (ISOVUE-370) INJECTION 76% COMPARISON:  Chest CT 01/31/2019. FINDINGS: Creatinine was obtained on site at [facility] [address] Results: Creatinine 0.8 mg/dL. Cardiovascular: Heart size is normal. There is no significant pericardial fluid, thickening or pericardial calcification. There is aortic atherosclerosis, as well as atherosclerosis of the great vessels of the mediastinum and the coronary arteries, including calcified atherosclerotic plaque in the left anterior descending and left circumflex coronary arteries. Mediastinum/Nodes: No pathologically enlarged mediastinal or hilar lymph nodes. Esophagus is unremarkable in appearance.  No axillary lymphadenopathy. Lungs/Pleura: The previously noted nodule in the apex of the left upper lobe currently measures 1.9 x 1.0 x 1.5 cm and is a mixture of ground-glass attenuation and solid components, with the largest solid component measuring up to 1.2 cm (axial image 26 of series 7). No other new suspicious appearing pulmonary nodules or masses are noted. No acute consolidative airspace disease. No pleural effusions.  Upper Abdomen: Aortic atherosclerosis. Musculoskeletal: There are no aggressive appearing lytic or blastic lesions noted in the visualized portions of the skeleton. Review of the MIP images confirms the above findings. IMPRESSION: 1. Previously noted left upper lobe nodule has slightly increased in size and has a mixed solid and sub solid appearance on today's examination measuring up to 1.9 x 1.0 x 1.5 cm, with a solid component measuring up to 1.2 cm. This is concerning for slow-growing neoplasm such as a primary bronchogenic adenocarcinoma. 2. Aortic atherosclerosis, in addition to 2 vessel coronary artery disease. Assessment for potential risk factor modification, dietary therapy or pharmacologic therapy may be warranted, if clinically indicated. Aortic Atherosclerosis (ICD10-I70.0). Electronically Signed   By: Vinnie Langton M.D.   On: 07/25/2019 12:00   I have independently reviewed the above radiology studies  and reviewed the findings with the patient.  Ct Chest Wo Contrast  Result Date: 01/31/2019 CLINICAL DATA:  Follow-up pulmonary nodule, left upper lobe EXAM: CT CHEST WITHOUT CONTRAST TECHNIQUE: Multidetector CT imaging of the chest was performed following the standard protocol without IV contrast. COMPARISON:  Chest radiograph, 11/28/2018, CT chest, 09/01/2018, 03/20/2014 FINDINGS: Cardiovascular: Coronary artery calcifications and/or stents. Normal heart size. No pericardial effusion. Aortic atherosclerosis. Mediastinum/Nodes: No enlarged mediastinal, hilar, or axillary lymph nodes. Thyroid gland, trachea, and esophagus demonstrate no significant findings. Lungs/Pleura: Stable or minimally decreased size of an irregular nodule of the left pulmonary apex, measuring approximately 1.5 x 1.1 cm (series 8, image 22). New ground-glass opacities of the lateral right upper lobe, likely infectious or inflammatory and measuring up to 2.7 cm (series 8, image 67). No pleural effusion or pneumothorax. Upper  Abdomen: No acute abnormality. Musculoskeletal: No chest wall mass or suspicious bone lesions identified. IMPRESSION: 1. Stable or minimally decreased size of an irregular nodule of the left pulmonary apex, measuring approximately 1.5 x 1.1 cm (series 8, image 22). 2. New ground-glass opacities of the lateral right upper lobe, likely infectious or inflammatory and measuring up to 2.7 cm (series 8, image 67). 3.  Coronary artery disease. Electronically Signed   By: Eddie Candle M.D.   On: 01/31/2019 15:23   Ct Angio Abd/pel W/ And/or W/o  Result Date: 01/08/2019 CLINICAL DATA:  Generalized abdominal pain, diarrhea and bloody stools. EXAM: CT ANGIOGRAPHY ABDOMEN AND PELVIS WITH CONTRAST TECHNIQUE: Multidetector CT imaging of the abdomen and pelvis was performed using the standard protocol during bolus administration of intravenous contrast. Multiplanar reconstructed images and MIPs were obtained and reviewed to evaluate the vascular anatomy. CONTRAST:  179m ISOVUE-370 IOPAMIDOL (ISOVUE-370) INJECTION 76% COMPARISON:  CT of the abdomen and pelvis with contrast on 11/28/2018 and CT of the chest on 09/01/2018. CTA of the abdomen and pelvis on 06/08/2018 FINDINGS: VASCULAR Aorta: Scattered atherosclerotic plaque and tortuosity without evidence of aneurysm or significant stenosis. Celiac: Heavily calcified plaque at the celiac origin likely causes significant stenosis of greater than 70%. This appears stable since July, 2019. The celiac trunk itself remains open with normal patency of distal branch vessels. SMA: Calcified plaque at origin without evidence of significant stenosis. There is  some additional noncalcified plaque in the proximal trunk of the superior mesenteric artery that causes approximately 40% non critical narrowing. This appears stable since the prior CTA in July, 2019. Renals: Bilateral single renal arteries present. Calcified and noncalcified plaque in the proximal right renal artery does not cause  significant stenosis. 50-60% narrowing at the origin of the left renal artery appears stable. IMA: Normally patent. Inflow: Scattered calcified plaque in bilateral iliac arteries without significant stenosis or evidence of aneurysmal disease. Proximal Outflow: Bilateral common femoral arteries and femoral bifurcations are normally patent. Veins: Venous phase imaging was also performed through the entire abdomen and pelvis demonstrating no evidence of mesenteric venous, portal vein or splenic vein thrombus. The IVC, iliac veins and common femoral veins are normally patent. Bilateral renal veins are widely patent. Review of the MIP images confirms the above findings. NON-VASCULAR Lower chest: Potentially new area sub-solid ground-glass opacity of the right lung which is not completely imaged and lies just superior to the minor fissure at the base of the right upper lobe and measures on the order of 1.8-1.9 cm. This is not completely evaluated. As this was not present 4 months ago, this may represent an area of atelectasis rather than a new pulmonary lesion. Focal area of pneumonia can not be excluded. Hepatobiliary: No focal liver abnormality is seen. No gallstones, gallbladder wall thickening, or biliary dilatation. Pancreas: Unremarkable. No pancreatic ductal dilatation or surrounding inflammatory changes. Spleen: Normal in size without focal abnormality. Adrenals/Urinary Tract: Adrenal glands are unremarkable. Kidneys are normal, without renal calculi, focal lesion, or hydronephrosis. Bladder is unremarkable. Stomach/Bowel: Previously noted acute colitis involving a long segment of the colon from roughly the mid transverse to rectal levels has now resolved with no significant residual inflammation remaining. There is suggestion, however of potential distal rectal wall thickening. Definition of the rectum is not very clear by CT as it emerges with the vagina anteriorly. Rectal mass is not excluded and correlation is  suggested with rectal exam. No evidence of bowel obstruction, ileus or free air. The appendix is unremarkable. Lymphatic: No enlarged lymph nodes identified in the abdomen or pelvis. Reproductive: Status post hysterectomy. No adnexal masses. The residual vaginal cavity appears irregularly thickened towards the pelvic floor. This appears more prominent compared to prior CT studies, including the study in July of 2019. Correlation suggested with gynecologic exam as a vaginal mass cannot be excluded. Other: No hernias are identified. No ascites or focal abscess identified. 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 No fractures or bony lesions identified. IMPRESSION: VASCULAR 1. 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 2. Stable mild stenosis of the proximal SMA. 3. 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 NON-VASCULAR 1. New area of pulmonary opacity measuring approximately 1.8-1.9 cm and having a ground-glass sub solid appearance in the inferior right upper lobe just above the minor fissure. This was not present by chest CT 4 months ago which would argue for a non neoplastic process such as atelectasis or infection. 2. Essential resolution of previously noted colitis involving a long segment of the colon with potential residual thickening of the rectum. Evaluation of the rectum is limited by CT and correlation is suggested with rectal exam as underlying mass can not be excluded. 3. Irregular thickened appearance of the vaginal cavity which appears more prominent compared to prior CT. Recommend correlation with gynecologic exam as a vaginal mass  cannot be excluded. Electronically Signed   By: Aletta Edouard M.D.   On: 01/08/2019 20:53    CLINICAL DATA:  Left shoulder hematoma.  No known injury.  EXAM: CT CHEST WITH CONTRAST  TECHNIQUE: Multidetector CT imaging of  the chest was performed during intravenous contrast administration.  CONTRAST:  53m OMNIPAQUE IOHEXOL 300 MG/ML  SOLN  COMPARISON:  CT chest 03/20/2014  FINDINGS: Cardiovascular: Normal heart size. Coronary arterial vascular calcifications. Thoracic aortic vascular calcifications.  Mediastinum/Nodes: No enlarged axillary, mediastinal or hilar lymphadenopathy. Normal appearance of the esophagus.  Lungs/Pleura: Central airways are patent. Dependent atelectasis/scarring within the bilateral lower lobes. 1.7 x 1.2 cm irregular sub solid nodule left lung apex (image 21; series 4), previously measuring 0.7 x 1.0 cm. No large area of pulmonary consolidation. No pleural effusion or pneumothorax.  Upper Abdomen: No acute process.  Musculoskeletal: Thoracic spine degenerative changes. No aggressive or acute appearing osseous lesions.  IMPRESSION: 1. There is a 1.7 cm enlarging sub solid nodule within the left lung apex. Possibility of slow growing malignancy should be considered. Recommend multi disciplinary thoracic oncology consultation. 2. No acute process within the chest. thank 3. Aortic Atherosclerosis (ICD10-I70.0).   Electronically Signed   By: DLovey NewcomerM.D.   On: 09/01/2018 20:33  Scan from 2015: no mention of left  upper lobe lesion that is present    02/2014:   Recent Lab Findings: Lab Results  Component Value Date   WBC 9.4 02/14/2019   HGB 13.2 02/14/2019   HCT 40.7 02/14/2019   PLT 259 02/14/2019   GLUCOSE 135 (H) 02/15/2019   CHOL 286 (H) 02/15/2019   TRIG 139 02/15/2019   HDL 70 02/15/2019   LDLCALC 188 (H) 02/15/2019   ALT 12 02/15/2019   AST 14 (L) 02/15/2019   NA 127 (L) 02/15/2019   K 4.7 02/15/2019   CL 91 (L) 02/15/2019   CREATININE 0.70 02/15/2019   BUN 8 02/15/2019   CO2 24 02/15/2019   TSH 8.229 (H) 02/15/2019   INR 0.9 02/14/2019   HGBA1C 5.9 (H) 02/15/2019      Assessment / Plan:   Patient has a persistent  parenchymal irregularity left upper lobe, that was present in 2015 and is incidentally noted again in 2019 when she obtain a CT scan for spontaneous bruising along the left neck and chest.  The area in question does appear to have enlarged very slightly over the 4 years.  The mass is indeterminate as if it is malignant or not but if it is it is changing slowly Follow-up CT scan today compared to October 2019 shows stable to slightly decrease in size of the left upper lobe nodule  Additional scan done today shows progression of solid component to 1.2 mm in size.  Will obtain a PET scan, save the current CT scan is a super D for possible navigation bronchoscopy.  I will plan to see her back as soon as the PET scan is done  EGrace IsaacMD      [address] [place] [id number] [phone]  [place] 07/25/2019 1:43 PM

## 2019-08-02 ENCOUNTER — Ambulatory Visit (HOSPITAL_COMMUNITY)
Admission: RE | Admit: 2019-08-02 | Discharge: 2019-08-02 | Disposition: A | Discharge status: Home / Self Care | Payer: Medicare Other | Source: Ambulatory Visit | Attending: Cardiothoracic Surgery | Admitting: Cardiothoracic Surgery

## 2019-08-02 ENCOUNTER — Other Ambulatory Visit: Payer: Self-pay

## 2019-08-02 DIAGNOSIS — Z79899 Other long term (current) drug therapy: Secondary | ICD-10-CM | POA: Diagnosis not present

## 2019-08-02 DIAGNOSIS — I700 Atherosclerosis of aorta: Secondary | ICD-10-CM | POA: Insufficient documentation

## 2019-08-02 DIAGNOSIS — R911 Solitary pulmonary nodule: Secondary | ICD-10-CM | POA: Diagnosis not present

## 2019-08-02 LAB — GLUCOSE, CAPILLARY: Glucose-Capillary: 72 mg/dL (ref 70–99)

## 2019-08-02 MED ORDER — FLUDEOXYGLUCOSE F - 18 (FDG) INJECTION
8.3800 | Freq: Once | INTRAVENOUS | Status: AC | PRN
  Administered 2019-08-02: 8.38 via INTRAVENOUS

## 2019-08-07 ENCOUNTER — Ambulatory Visit (INDEPENDENT_AMBULATORY_CARE_PROVIDER_SITE_OTHER): Payer: Medicare Other | Admitting: Cardiothoracic Surgery

## 2019-08-07 ENCOUNTER — Other Ambulatory Visit: Payer: Self-pay

## 2019-08-07 ENCOUNTER — Encounter: Payer: Self-pay | Admitting: Cardiothoracic Surgery

## 2019-08-07 VITALS — BP 174/90 | HR 74 | Temp 97.5°F | Resp 16 | Ht 64.0 in | Wt 168.2 lb

## 2019-08-07 DIAGNOSIS — I639 Cerebral infarction, unspecified: Secondary | ICD-10-CM | POA: Diagnosis not present

## 2019-08-07 DIAGNOSIS — R911 Solitary pulmonary nodule: Secondary | ICD-10-CM

## 2019-08-07 NOTE — Progress Notes (Signed)
[facility] Record #[id number] Date of Birth: 1946-04-18  Referring: [name], MD Primary Care: [name], MD Primary Cardiologist: No primary care provider on file.  Chief Complaint:    Follow-up CT scan of the chest for left upper lobe lung nodule   History of Present Illness:    Brittany Goodman 73 y.o. female had recent pet for evaluation of left upper lobe lung abnormality.   The patient was originally rprecipitated by a visit to the emergency room when she noted extensive bruising along her left neck left anterior chest and down along the left side of her chest.  She denies any specific trauma.  She had been taking high-dose aspirin at.  This was treated without any specific intervention other than discontinuing the aspirin.  Incidentally a CT scan suggested an irregular mass in the left upper lobe.  She had a previous history of a left upper lobe mass that she says was followed for several years with CT scan.  On review of her history and films a CT scan from 2015 was located.  The report on that scan does not mention left upper lobe irregularity but on review of the film there is an area of irregular mass in the area of question that persists now.  It does appear slightly enlarged over 4 years.   Patient is a distant smoker having smoked for approximately 10 years and quitting in 1973.  She denies any history of other malignancy.  Since last seen a PET scan has been done and the patient comes in today with her son to review the results  Current Activity/ Functional Status:  Patient is independent with mobility/ambulation, transfers, ADL's, IADL's.   Zubrod Score: At the time of surgery this patients most appropriate activity status/level should be described as: []     0    Normal activity, no symptoms [x]     1    Restricted in physical strenuous activity but ambulatory, able to do out light work []     2    Ambulatory and  capable of self care, unable to do work activities, up and about               >50 % of waking hours                              []     3    Only limited self care, in bed greater than 50% of waking hours []     4    Completely disabled, no self care, confined to bed or chair []     5    Moribund   Past Medical History:  Diagnosis Date   Arthritis    CVA (cerebral vascular accident) ([place]) 02/14/2019   Depression    Depression 02/14/2019   Diabetes mellitus type 2, uncontrolled, with complications ([place]) 08/07/9149   Diabetes mellitus type II    Diverticulosis    GERD (gastroesophageal reflux disease)    Hyperlipidemia    Hypertension    Hypothyroidism    OSA (obstructive sleep apnea) 02/14/2019   Sepsis ([place])    2011, Escherichia coli pyelonephritis   Sleep apnea    Does not use CPAP. Cannot tolerate   Spinal stenosis    Spinal stenosis 02/14/2019   Stroke ([place])  no deficits    Past Surgical History:  Procedure Laterality Date   BACK SURGERY     CARPAL TUNNEL RELEASE Right    COLONOSCOPY N/A 05/29/2013   Focal left colonic inflammation, likely remnant of recent bout of ischemic or segmental colitis, s/p biopsy. pancolonic diverticulosis. Due for surveillance in 5 years   COLONOSCOPY WITH PROPOFOL N/A 07/26/2018   Procedure: COLONOSCOPY WITH PROPOFOL;  Surgeon: Daneil Dolin, MD;  [facility];  Service: Endoscopy;  Laterality: N/A;  8:30am   ESOPHAGOGASTRODUODENOSCOPY (EGD) WITH ESOPHAGEAL DILATION N/A 05/29/2013   normal appearing patent tubular esophagus, small hiatal hernia, multiple antral erosions. no ulcer. normlal duodenum. empiric dilation   ILIOTIBIAL BAND RELEASE Left    POLYPECTOMY  07/26/2018   Procedure: POLYPECTOMY;  Surgeon: Daneil Dolin, MD;  Location: [facility];  Service: Endoscopy;;  hepatic flexure polyp   TOTAL ABDOMINAL HYSTERECTOMY      Family History  Problem Relation Age of Onset   Heart disease Mother        MI     Heart disease Father    Heart attack Father 18   Heart disease Brother    Cancer Brother        ADRENAL GLAND   Colon polyps Brother    Liver cancer Brother 41       deceased , ?metastatic cancer?    Heart disease Maternal Grandfather        MI   Cancer Paternal Grandfather        LUNG   Colon cancer Neg Hx      Social History   Tobacco Use  Smoking Status Former Smoker   Packs/day: 2.00   Years: 13.00   Pack years: 26.00   Quit date: 07/19/1966   Years since quitting: 53.0  Smokeless Tobacco Former Systems developer   Types: Loss adjuster, chartered    Social History   Substance and Sexual Activity  Alcohol Use No     Allergies  Allergen Reactions   Celecoxib Swelling    Pt states she can tolerate ibuprofen   Codeine Other (See Comments)    "makes me feel bad"   Dilaudid [Hydromorphone Hcl] Other (See Comments)    hallucination   Morphine Nausea And Vomiting   Statins     Muscle pain   Tetracycline Nausea And Vomiting    Current Outpatient Medications  Medication Sig Dispense Refill   acetaminophen (TYLENOL) 500 MG tablet Take 1,000 mg by mouth 2 (two) times daily as needed for mild pain or moderate pain.      Ascorbic Acid (VITAMIN C) 1000 MG tablet Take 1,000-2,000 mg by mouth daily.      aspirin EC 81 MG tablet Take 1 tablet (81 mg total) by mouth daily. 150 tablet 2   atenolol (TENORMIN) 100 MG tablet Take 100 mg by mouth every morning.      Biotin 5000 MCG TABS Take 1 tablet by mouth daily.     Cholecalciferol (VITAMIN D) 125 MCG (5000 UT) CAPS Take 1 capsule by mouth daily.      citalopram (CELEXA) 20 MG tablet Take 20 mg by mouth every other day.      clopidogrel (PLAVIX) 75 MG tablet Take 1 tablet (75 mg total) by mouth daily. 30 tablet 11   cyclobenzaprine (FLEXERIL) 10 MG tablet Take 10 mg by mouth 2 (two) times daily as needed for muscle spasms.      folic acid (FOLVITE) 416 MCG tablet Take 800 mcg by mouth  daily.      glimepiride (AMARYL) 1  MG tablet Take 1 mg by mouth daily before breakfast.      HYDROcodone-acetaminophen (NORCO/VICODIN) 5-325 MG tablet Take 1 tablet by mouth every 4 (four) hours as needed for moderate pain.   0   levothyroxine (SYNTHROID, LEVOTHROID) 88 MCG tablet Take 88 mcg by mouth daily before breakfast.      lisinopril (PRINIVIL,ZESTRIL) 10 MG tablet Take 1 tablet (10 mg total) by mouth daily for 30 days. 30 tablet 0   loratadine (CLARITIN) 10 MG tablet Take 10 mg by mouth daily.     metFORMIN (GLUCOPHAGE) 1000 MG tablet Take 1,000 mg by mouth 2 (two) times daily with a meal.      NONFORMULARY OR COMPOUNDED ITEM Apply 1 application topically 3 (three) times daily as needed (Pain). Diclofenac, Baclofen, Gabapentin, Lidocaine, Menthol - Compounded at [facility]     omeprazole (PRILOSEC) 20 MG capsule Take 20 mg by mouth daily.      polyethylene glycol (MIRALAX / GLYCOLAX) packet Take 17 g by mouth daily as needed for moderate constipation or severe constipation. 30 each 0   simvastatin (ZOCOR) 10 MG tablet Take 1 tablet (10 mg total) by mouth daily at 6 PM for 30 days. 30 tablet 2   vitamin B-12 (CYANOCOBALAMIN) 1000 MCG tablet Take 1,000 mcg by mouth daily.     No current facility-administered medications for this visit.     Although listed on the patient's medicine profile she notes that since the bleeding and bruising episode along the left neck and left chest she has not taken any other aspirin.  Pertinent items are noted in HPI.        PHYSICAL EXAMINATION: Ht 5' 4"  (1.626 m)    BMI 28.72 kg/m  General appearance: alert, cooperative, appears older than stated age and no distress Head: Normocephalic, without obvious abnormality, atraumatic Neck: no adenopathy, no carotid bruit, no JVD, supple, symmetrical, trachea midline and thyroid not enlarged, symmetric, no tenderness/mass/nodules Lymph nodes: Cervical, supraclavicular, and axillary nodes normal. Resp: clear to auscultation  bilaterally Cardio: regular rate and rhythm, S1, S2 normal, no murmur, click, rub or gallop GI: soft, non-tender; bowel sounds normal; no masses,  no organomegaly Extremities: extremities normal, atraumatic, no cyanosis or edema Neurologic: Grossly normal  Diagnostic Studies & Laboratory data:     Recent Radiology Findings:   Nm Pet Image Initial (pi) Skull Base To Thigh  Result Date: 08/02/2019 CLINICAL DATA:  Initial treatment strategy for solitary pulmonary nodule. EXAM: NUCLEAR MEDICINE PET SKULL BASE TO THIGH TECHNIQUE: 8.4 mCi F-18 FDG was injected intravenously. Full-ring PET imaging was performed from the skull base to thigh after the radiotracer. CT data was obtained and used for attenuation correction and anatomic localization. Fasting blood glucose: 72 mg/dl COMPARISON:  CT 07/25/2019 FINDINGS: Mediastinal blood pool activity: SUV max 2.1 Liver activity: SUV max NA NECK: No hypermetabolic lymph nodes in the neck. Incidental CT findings: none CHEST: Irregular LEFT apical nodule along the pleural surface measuring 9 mm (image 48/4) corresponds to the nodule of concern on comparison CT . This nodule has low metabolic activity (SUV max equal 1.9). No additional hypermetabolic pulmonary nodules. No hypermetabolic mediastinal lymph nodes. Incidental CT findings: Coronary artery calcification and aortic atherosclerotic calcification. ABDOMEN/PELVIS: No abnormal hypermetabolic activity within the liver, pancreas, adrenal glands, or spleen. No hypermetabolic lymph nodes in the abdomen or pelvis. Diffuse activity within colon and distal small bowel is physiologic. Incidental CT findings: Post hysterectomy Atherosclerotic  calcification of the aorta. SKELETON: No focal hypermetabolic activity to suggest skeletal metastasis. Incidental CT findings: none IMPRESSION: 1. Low metabolic activity associated with the LEFT upper lobe pulmonary nodule favors benign etiology. Recommend follow-up CT in 6-12 months.  If lesion continues to increase in size, consider tissue sampling. 2.  Aortic Atherosclerosis (ICD10-I70.0). Electronically Signed   By: Suzy Bouchard M.D.   On: 08/02/2019 11:36   Ct Angio Chest Aorta W &/or Wo Contrast  Ct Angio Chest Aorta W &/or Wo Contrast  Result Date: 07/25/2019 CLINICAL DATA:  73 year old female with history of non-small cell lung cancer. Follow-up study. EXAM: CT ANGIOGRAPHY CHEST WITH CONTRAST TECHNIQUE: Multidetector CT imaging of the chest was performed using the standard protocol during bolus administration of intravenous contrast. Multiplanar CT image reconstructions and MIPs were obtained to evaluate the vascular anatomy. CONTRAST:  15m ISOVUE-370 IOPAMIDOL (ISOVUE-370) INJECTION 76% COMPARISON:  Chest CT 01/31/2019. FINDINGS: [facility] [address] Results: Creatinine 0.8 mg/dL. Cardiovascular: Heart size is normal. There is no significant pericardial fluid, thickening or pericardial calcification. There is aortic atherosclerosis, as well as atherosclerosis of the great vessels of the mediastinum and the coronary arteries, including calcified atherosclerotic plaque in the left anterior descending and left circumflex coronary arteries. Mediastinum/Nodes: No pathologically enlarged mediastinal or hilar lymph nodes. Esophagus is unremarkable in appearance. No axillary lymphadenopathy. Lungs/Pleura: The previously noted nodule in the apex of the left upper lobe currently measures 1.9 x 1.0 x 1.5 cm and is a mixture of ground-glass attenuation and solid components, with the largest solid component measuring up to 1.2 cm (axial image 26 of series 7). No other new suspicious appearing pulmonary nodules or masses are noted. No acute consolidative airspace disease. No pleural effusions. Upper Abdomen: Aortic atherosclerosis. Musculoskeletal: There are no aggressive appearing lytic or blastic lesions noted in the visualized  portions of the skeleton. Review of the MIP images confirms the above findings. IMPRESSION: 1. Previously noted left upper lobe nodule has slightly increased in size and has a mixed solid and sub solid appearance on today's examination measuring up to 1.9 x 1.0 x 1.5 cm, with a solid component measuring up to 1.2 cm. This is concerning for slow-growing neoplasm such as a primary bronchogenic adenocarcinoma. 2. Aortic atherosclerosis, in addition to 2 vessel coronary artery disease. Assessment for potential risk factor modification, dietary therapy or pharmacologic therapy may be warranted, if clinically indicated. Aortic Atherosclerosis (ICD10-I70.0). Electronically Signed   By: DVinnie LangtonM.D.   On: 07/25/2019 12:00   I have independently reviewed the above radiology studies  and reviewed the findings with the patient.  Ct Chest Wo Contrast  Result Date: 01/31/2019 CLINICAL DATA:  Follow-up pulmonary nodule, left upper lobe EXAM: CT CHEST WITHOUT CONTRAST TECHNIQUE: Multidetector CT imaging of the chest was performed following the standard protocol without IV contrast. COMPARISON:  Chest radiograph, 11/28/2018, CT chest, 09/01/2018, 03/20/2014 FINDINGS: Cardiovascular: Coronary artery calcifications and/or stents. Normal heart size. No pericardial effusion. Aortic atherosclerosis. Mediastinum/Nodes: No enlarged mediastinal, hilar, or axillary lymph nodes. Thyroid gland, trachea, and esophagus demonstrate no significant findings. Lungs/Pleura: Stable or minimally decreased size of an irregular nodule of the left pulmonary apex, measuring approximately 1.5 x 1.1 cm (series 8, image 22). New ground-glass opacities of the lateral right upper lobe, likely infectious or inflammatory and measuring up to 2.7 cm (series 8, image 67). No pleural effusion or pneumothorax. Upper Abdomen: No acute abnormality. Musculoskeletal: No chest wall mass  or suspicious bone lesions identified. IMPRESSION: 1. Stable or  minimally decreased size of an irregular nodule of the left pulmonary apex, measuring approximately 1.5 x 1.1 cm (series 8, image 22). 2. New ground-glass opacities of the lateral right upper lobe, likely infectious or inflammatory and measuring up to 2.7 cm (series 8, image 67). 3.  Coronary artery disease. Electronically Signed   By: Eddie Candle M.D.   On: 01/31/2019 15:23   Ct Angio Abd/pel W/ And/or W/o  Result Date: 01/08/2019 CLINICAL DATA:  Generalized abdominal pain, diarrhea and bloody stools. EXAM: CT ANGIOGRAPHY ABDOMEN AND PELVIS WITH CONTRAST TECHNIQUE: Multidetector CT imaging of the abdomen and pelvis was performed using the standard protocol during bolus administration of intravenous contrast. Multiplanar reconstructed images and MIPs were obtained and reviewed to evaluate the vascular anatomy. CONTRAST:  157m ISOVUE-370 IOPAMIDOL (ISOVUE-370) INJECTION 76% COMPARISON:  CT of the abdomen and pelvis with contrast on 11/28/2018 and CT of the chest on 09/01/2018. CTA of the abdomen and pelvis on 06/08/2018 FINDINGS: VASCULAR Aorta: Scattered atherosclerotic plaque and tortuosity without evidence of aneurysm or significant stenosis. Celiac: Heavily calcified plaque at the celiac origin likely causes significant stenosis of greater than 70%. This appears stable since July, 2019. The celiac trunk itself remains open with normal patency of distal branch vessels. SMA: Calcified plaque at origin without evidence of significant stenosis. There is some additional noncalcified plaque in the proximal trunk of the superior mesenteric artery that causes approximately 40% non critical narrowing. This appears stable since the prior CTA in July, 2019. Renals: Bilateral single renal arteries present. Calcified and noncalcified plaque in the proximal right renal artery does not cause significant stenosis. 50-60% narrowing at the origin of the left renal artery appears stable. IMA: Normally patent. Inflow:  Scattered calcified plaque in bilateral iliac arteries without significant stenosis or evidence of aneurysmal disease. Proximal Outflow: Bilateral common femoral arteries and femoral bifurcations are normally patent. Veins: Venous phase imaging was also performed through the entire abdomen and pelvis demonstrating no evidence of mesenteric venous, portal vein or splenic vein thrombus. The IVC, iliac veins and common femoral veins are normally patent. Bilateral renal veins are widely patent. Review of the MIP images confirms the above findings. NON-VASCULAR Lower chest: Potentially new area sub-solid ground-glass opacity of the right lung which is not completely imaged and lies just superior to the minor fissure at the base of the right upper lobe and measures on the order of 1.8-1.9 cm. This is not completely evaluated. As this was not present 4 months ago, this may represent an area of atelectasis rather than a new pulmonary lesion. Focal area of pneumonia can not be excluded. Hepatobiliary: No focal liver abnormality is seen. No gallstones, gallbladder wall thickening, or biliary dilatation. Pancreas: Unremarkable. No pancreatic ductal dilatation or surrounding inflammatory changes. Spleen: Normal in size without focal abnormality. Adrenals/Urinary Tract: Adrenal glands are unremarkable. Kidneys are normal, without renal calculi, focal lesion, or hydronephrosis. Bladder is unremarkable. Stomach/Bowel: Previously noted acute colitis involving a long segment of the colon from roughly the mid transverse to rectal levels has now resolved with no significant residual inflammation remaining. There is suggestion, however of potential distal rectal wall thickening. Definition of the rectum is not very clear by CT as it emerges with the vagina anteriorly. Rectal mass is not excluded and correlation is suggested with rectal exam. No evidence of bowel obstruction, ileus or free air. The appendix is unremarkable. Lymphatic:  No enlarged lymph nodes identified in the  abdomen or pelvis. Reproductive: Status post hysterectomy. No adnexal masses. The residual vaginal cavity appears irregularly thickened towards the pelvic floor. This appears more prominent compared to prior CT studies, including the study in July of 2019. Correlation suggested with gynecologic exam as a vaginal mass cannot be excluded. Other: No hernias are identified. No ascites or focal abscess identified. 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 No fractures or bony lesions identified. IMPRESSION: VASCULAR 1. 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 2. Stable mild stenosis of the proximal SMA. 3. 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 NON-VASCULAR 1. New area of pulmonary opacity measuring approximately 1.8-1.9 cm and having a ground-glass sub solid appearance in the inferior right upper lobe just above the minor fissure. This was not present by chest CT 4 months ago which would argue for a non neoplastic process such as atelectasis or infection. 2. Essential resolution of previously noted colitis involving a long segment of the colon with potential residual thickening of the rectum. Evaluation of the rectum is limited by CT and correlation is suggested with rectal exam as underlying mass can not be excluded. 3. Irregular thickened appearance of the vaginal cavity which appears more prominent compared to prior CT. Recommend correlation with gynecologic exam as a vaginal mass cannot be excluded. Electronically Signed   By: Aletta Edouard M.D.   On: 01/08/2019 20:53    CLINICAL DATA:  Left shoulder hematoma.  No known injury.  EXAM: CT CHEST WITH CONTRAST  TECHNIQUE: Multidetector CT imaging of the chest was performed during intravenous contrast administration.  CONTRAST:  26m OMNIPAQUE IOHEXOL 300 MG/ML   SOLN  COMPARISON:  CT chest 03/20/2014  FINDINGS: Cardiovascular: Normal heart size. Coronary arterial vascular calcifications. Thoracic aortic vascular calcifications.  Mediastinum/Nodes: No enlarged axillary, mediastinal or hilar lymphadenopathy. Normal appearance of the esophagus.  Lungs/Pleura: Central airways are patent. Dependent atelectasis/scarring within the bilateral lower lobes. 1.7 x 1.2 cm irregular sub solid nodule left lung apex (image 21; series 4), previously measuring 0.7 x 1.0 cm. No large area of pulmonary consolidation. No pleural effusion or pneumothorax.  Upper Abdomen: No acute process.  Musculoskeletal: Thoracic spine degenerative changes. No aggressive or acute appearing osseous lesions.  IMPRESSION: 1. There is a 1.7 cm enlarging sub solid nodule within the left lung apex. Possibility of slow growing malignancy should be considered. Recommend multi disciplinary thoracic oncology consultation. 2. No acute process within the chest. thank 3. Aortic Atherosclerosis (ICD10-I70.0).   Electronically Signed   By: DLovey NewcomerM.D.   On: 09/01/2018 20:33  Scan from 2015: no mention of left  upper lobe lesion that is present    02/2014:   Recent Lab Findings: Lab Results  Component Value Date   WBC 9.4 02/14/2019   HGB 13.2 02/14/2019   HCT 40.7 02/14/2019   PLT 259 02/14/2019   GLUCOSE 135 (H) 02/15/2019   CHOL 286 (H) 02/15/2019   TRIG 139 02/15/2019   HDL 70 02/15/2019   LDLCALC 188 (H) 02/15/2019   ALT 12 02/15/2019   AST 14 (L) 02/15/2019   NA 127 (L) 02/15/2019   K 4.7 02/15/2019   CL 91 (L) 02/15/2019   CREATININE 0.70 02/15/2019   BUN 8 02/15/2019   CO2 24 02/15/2019   TSH 8.229 (H) 02/15/2019   INR 0.9 02/14/2019   HGBA1C 5.9 (H) 02/15/2019      Assessment /  Plan:   Patient has a persistent parenchymal irregularity left upper lobe, that was present in 2015 and is incidentally noted again in 2019 when she obtain a CT  scan for spontaneous bruising along the left neck and chest.  The area in question does appear to have enlarged very slightly over the 4 years.   I reviewed the PET scan results with her and her son : low metabolic activity associated with the LEFT upper lobe pulmonary nodule favors benign etiology. Recommend follow-up CT in 6-12 months. If lesion continues to increase in size, consider tissue sampling.  We will plan on a follow-up CT super D format in 6 months, if continued enlargement is noted consider navigation bronchoscopy with biopsy.  The patient would not be a resection candidate  Grace Isaac MD      [address] [place] [address] Office [phone]   Beeper [phone]  08/07/2019 3:50 PM

## 2019-08-21 DIAGNOSIS — E6609 Other obesity due to excess calories: Secondary | ICD-10-CM | POA: Diagnosis not present

## 2019-08-21 DIAGNOSIS — E119 Type 2 diabetes mellitus without complications: Secondary | ICD-10-CM | POA: Diagnosis not present

## 2019-08-21 DIAGNOSIS — I10 Essential (primary) hypertension: Secondary | ICD-10-CM | POA: Diagnosis not present

## 2019-08-21 DIAGNOSIS — E7849 Other hyperlipidemia: Secondary | ICD-10-CM | POA: Diagnosis not present

## 2019-08-29 DIAGNOSIS — F419 Anxiety disorder, unspecified: Secondary | ICD-10-CM | POA: Diagnosis not present

## 2019-08-29 DIAGNOSIS — E119 Type 2 diabetes mellitus without complications: Secondary | ICD-10-CM | POA: Diagnosis not present

## 2019-08-29 DIAGNOSIS — Z23 Encounter for immunization: Secondary | ICD-10-CM | POA: Diagnosis not present

## 2019-08-29 DIAGNOSIS — M5136 Other intervertebral disc degeneration, lumbar region: Secondary | ICD-10-CM | POA: Diagnosis not present

## 2019-08-29 DIAGNOSIS — Z6828 Body mass index (BMI) 28.0-28.9, adult: Secondary | ICD-10-CM | POA: Diagnosis not present

## 2019-08-29 DIAGNOSIS — R710 Precipitous drop in hematocrit: Secondary | ICD-10-CM | POA: Diagnosis not present

## 2019-08-29 DIAGNOSIS — I10 Essential (primary) hypertension: Secondary | ICD-10-CM | POA: Diagnosis not present

## 2019-08-29 DIAGNOSIS — M1990 Unspecified osteoarthritis, unspecified site: Secondary | ICD-10-CM | POA: Diagnosis not present

## 2019-08-29 DIAGNOSIS — E663 Overweight: Secondary | ICD-10-CM | POA: Diagnosis not present

## 2019-08-29 DIAGNOSIS — I639 Cerebral infarction, unspecified: Secondary | ICD-10-CM | POA: Diagnosis not present

## 2019-08-29 DIAGNOSIS — G894 Chronic pain syndrome: Secondary | ICD-10-CM | POA: Diagnosis not present

## 2019-09-21 ENCOUNTER — Emergency Department (HOSPITAL_COMMUNITY)
Admission: EM | Admit: 2019-09-21 | Discharge: 2019-09-21 | Disposition: A | Discharge status: Home / Self Care | Payer: Medicare Other | Attending: Emergency Medicine | Admitting: Emergency Medicine

## 2019-09-21 ENCOUNTER — Emergency Department (HOSPITAL_COMMUNITY): Payer: Medicare Other

## 2019-09-21 ENCOUNTER — Other Ambulatory Visit: Payer: Self-pay

## 2019-09-21 ENCOUNTER — Encounter (HOSPITAL_COMMUNITY): Payer: Self-pay | Admitting: *Deleted

## 2019-09-21 DIAGNOSIS — Z87891 Personal history of nicotine dependence: Secondary | ICD-10-CM | POA: Diagnosis not present

## 2019-09-21 DIAGNOSIS — Z7902 Long term (current) use of antithrombotics/antiplatelets: Secondary | ICD-10-CM | POA: Diagnosis not present

## 2019-09-21 DIAGNOSIS — E039 Hypothyroidism, unspecified: Secondary | ICD-10-CM | POA: Diagnosis not present

## 2019-09-21 DIAGNOSIS — R110 Nausea: Secondary | ICD-10-CM | POA: Diagnosis not present

## 2019-09-21 DIAGNOSIS — E119 Type 2 diabetes mellitus without complications: Secondary | ICD-10-CM | POA: Insufficient documentation

## 2019-09-21 DIAGNOSIS — Z7984 Long term (current) use of oral hypoglycemic drugs: Secondary | ICD-10-CM | POA: Diagnosis not present

## 2019-09-21 DIAGNOSIS — R1031 Right lower quadrant pain: Secondary | ICD-10-CM | POA: Diagnosis not present

## 2019-09-21 DIAGNOSIS — Z79899 Other long term (current) drug therapy: Secondary | ICD-10-CM | POA: Insufficient documentation

## 2019-09-21 DIAGNOSIS — Z7982 Long term (current) use of aspirin: Secondary | ICD-10-CM | POA: Insufficient documentation

## 2019-09-21 DIAGNOSIS — R1110 Vomiting, unspecified: Secondary | ICD-10-CM | POA: Diagnosis not present

## 2019-09-21 DIAGNOSIS — R300 Dysuria: Secondary | ICD-10-CM | POA: Diagnosis present

## 2019-09-21 DIAGNOSIS — I10 Essential (primary) hypertension: Secondary | ICD-10-CM | POA: Insufficient documentation

## 2019-09-21 HISTORY — DX: Personal history of other diseases of the digestive system: Z87.19

## 2019-09-21 LAB — URINALYSIS, ROUTINE W REFLEX MICROSCOPIC
Bilirubin Urine: NEGATIVE
Glucose, UA: NEGATIVE mg/dL
Hgb urine dipstick: NEGATIVE
Ketones, ur: NEGATIVE mg/dL
Leukocytes,Ua: NEGATIVE
Nitrite: NEGATIVE
Protein, ur: NEGATIVE mg/dL
Specific Gravity, Urine: 1.018 (ref 1.005–1.030)
pH: 6 (ref 5.0–8.0)

## 2019-09-21 LAB — COMPREHENSIVE METABOLIC PANEL
ALT: 13 U/L (ref 0–44)
AST: 16 U/L (ref 15–41)
Albumin: 4.1 g/dL (ref 3.5–5.0)
Alkaline Phosphatase: 47 U/L (ref 38–126)
Anion gap: 12 (ref 5–15)
BUN: 12 mg/dL (ref 8–23)
CO2: 23 mmol/L (ref 22–32)
Calcium: 9.8 mg/dL (ref 8.9–10.3)
Chloride: 98 mmol/L (ref 98–111)
Creatinine, Ser: 0.79 mg/dL (ref 0.44–1.00)
GFR calc Af Amer: >60 mL/min (ref >60)
GFR calc non Af Amer: >60 mL/min (ref >60)
Glucose, Bld: 100 mg/dL — ABNORMAL HIGH (ref 70–99)
Potassium: 3.9 mmol/L (ref 3.5–5.1)
Sodium: 133 mmol/L — ABNORMAL LOW (ref 135–145)
Total Bilirubin: 0.5 mg/dL (ref 0.3–1.2)
Total Protein: 7.2 g/dL (ref 6.5–8.1)

## 2019-09-21 LAB — CBC WITH DIFFERENTIAL/PLATELET
Abs Immature Granulocytes: 0.03 10*3/uL (ref 0.00–0.07)
Basophils Absolute: 0 10*3/uL (ref 0.0–0.1)
Basophils Relative: 0 %
Eosinophils Absolute: 0.1 10*3/uL (ref 0.0–0.5)
Eosinophils Relative: 2 %
HCT: 38.5 % (ref 36.0–46.0)
Hemoglobin: 12.1 g/dL (ref 12.0–15.0)
Immature Granulocytes: 0 %
Lymphocytes Relative: 23 %
Lymphs Abs: 1.9 10*3/uL (ref 0.7–4.0)
MCH: 26.7 pg (ref 26.0–34.0)
MCHC: 31.4 g/dL (ref 30.0–36.0)
MCV: 85 fL (ref 80.0–100.0)
Monocytes Absolute: 0.6 10*3/uL (ref 0.1–1.0)
Monocytes Relative: 7 %
Neutro Abs: 5.5 10*3/uL (ref 1.7–7.7)
Neutrophils Relative %: 68 %
Platelets: 278 10*3/uL (ref 150–400)
RBC: 4.53 MIL/uL (ref 3.87–5.11)
RDW: 14.5 % (ref 11.5–15.5)
WBC: 8.1 10*3/uL (ref 4.0–10.5)
nRBC: 0 % (ref 0.0–0.2)

## 2019-09-21 LAB — LIPASE, BLOOD: Lipase: 68 U/L — ABNORMAL HIGH (ref 11–51)

## 2019-09-21 MED ORDER — SODIUM CHLORIDE 0.9 % IV BOLUS
500.0000 mL | Freq: Once | INTRAVENOUS | Status: AC
  Administered 2019-09-21: 500 mL via INTRAVENOUS

## 2019-09-21 MED ORDER — IOHEXOL 300 MG/ML  SOLN
100.0000 mL | Freq: Once | INTRAMUSCULAR | Status: AC | PRN
  Administered 2019-09-21: 100 mL via INTRAVENOUS

## 2019-09-21 NOTE — ED Notes (Signed)
Pt report urosepsis in 2011 Also hx of colitis Saw Dr Armandina Gemma and was put on a Rx for cipro  Reports this am she awakened with tingling legs and N Reports she takes a probiotic due to Colitis flare   Here for eval as she is not urinating as she usually does

## 2019-09-21 NOTE — ED Notes (Signed)
Awaiting fluids to finish

## 2019-09-21 NOTE — ED Notes (Signed)
To Rad

## 2019-09-21 NOTE — Discharge Instructions (Signed)
You have been diagnosed today with nausea.  At this time there does not appear to be the presence of an emergent medical condition, however there is always the potential for conditions to change. Please read and follow the below instructions.  Please return to the Emergency Department immediately for any new or worsening symptoms. Please be sure to follow up with your Primary Care Provider within one week regarding your visit today; please call their office to schedule an appointment even if you are feeling better for a follow-up visit. Please complete your antibiotic medication Cipro as prescribed by your primary care provider.  Please be sure to drink plenty of water to avoid dehydration and get plenty of rest. Your CT scan today showed hepatic steatosis, colonic diverticula, aortic atherosclerosis, dextroscoliosis of the thoracic spine with degenerative disease.  Please discuss these incidental findings with your primary care provider at your next visit.  Your lipase was slightly elevated today at 68, please discuss this with your primary care provider at your next visit.  Get help right away if: You have pain in your chest, neck, arm, or jaw. You feel very weak or you pass out (faint). You have throw up that is bright red or looks like coffee grounds. You have bloody or black poop (stools) or poop that looks like tar. You have a very bad headache, a stiff neck, or both. You have very bad pain, cramping, or bloating in your belly (abdomen). You have trouble breathing or you are breathing very quickly. Your heart is beating very quickly. Your skin feels cold and clammy. You feel confused. You have signs of losing too much water in your body, such as: Dark pee, very little pee, or no pee. Cracked lips. Dry mouth. Sunken eyes. Sleepiness. Weakness. You have any new/concerning or worsening of symptoms.  Please read the additional information packets attached to your discharge  summary.  Do not take your medicine if  develop an itchy rash, swelling in your mouth or lips, or difficulty breathing; call 911 and seek immediate emergency medical attention if this occurs.  Note: Portions of this text may have been transcribed using voice recognition software. Every effort was made to ensure accuracy; however, inadvertent computerized transcription errors may still be present.

## 2019-09-21 NOTE — ED Triage Notes (Signed)
Pt c/o nausea and decreased urination that started Tuesday. Pt reports her PCP put her on Cipro on Wednesday for UTI. Pt also c/o tingling behind her legs bilaterally when she woke up this morning at 0500. Pt ambulatory but reports "I just don't feel right". Hx of urosepsis a few months ago.

## 2019-09-21 NOTE — ED Notes (Signed)
From CT

## 2019-09-21 NOTE — ED Notes (Signed)
Pt reports she failed to take her morning BP meds today

## 2019-09-21 NOTE — ED Provider Notes (Signed)
[facility] EMERGENCY DEPARTMENT Provider Note   CSN: [id number] Arrival date & time: 09/21/19  1026     History   Chief Complaint Chief Complaint  Patient presents with  . Decreased urination    HPI Brittany Goodman is a 73 y.o. female history of CVA, diabetes, GERD, hypertension, hyperlipidemia, spinal stenosis presents today for concern of urinary tract infection.  Patient reports that 4 days ago she developed nausea and dysuria, she reports 3 days ago she called her PCP Dr. Armandina Gemma who sent her in a prescription of ciprofloxacin 500 mg twice daily.  She reports she has been taking this for the last 3 days without improvement of her symptoms.  She reports that dysuria, a burning sensation mild constant with every urination and improving when she stops has persisted.  She reports nausea without vomiting.  She reports right flank pain a mild aching sensation constant without aggravating or alleviating factors or radiation.  She reports that she had an admission for urosepsis in 2011 and had similar symptoms at that time.  Patient's main concern today is that she has had decreased amount of urination over the past 2 days.  Patient reports that she has been taking Tylenol in addition to ciprofloxacin for her symptoms, last dose 1000 mg around 7 AM this morning.  Denies fever/chills, headache, chest pain/shortness of breath, hematuria, diarrhea, numbness/weakness, tingling, fall/injury or any additional concerns.    HPI  Past Medical History:  Diagnosis Date  . Arthritis   . CVA (cerebral vascular accident) ([place]) 02/14/2019  . Depression   . Depression 02/14/2019  . Diabetes mellitus type 2, uncontrolled, with complications ([place]) 12/15/5807  . Diabetes mellitus type II   . Diverticulosis   . GERD (gastroesophageal reflux disease)   . History of colitis   . Hyperlipidemia   . Hypertension   . Hypothyroidism   . OSA (obstructive sleep apnea) 02/14/2019  . Sepsis ([place])    2011,  Escherichia coli pyelonephritis  . Sleep apnea    Does not use CPAP. Cannot tolerate  . Spinal stenosis   . Spinal stenosis 02/14/2019  . Stroke [facility])    no deficits    Patient Active Problem List   Diagnosis Date Noted  . CVA (cerebral vascular accident) ([place]) 02/14/2019  . Depression 02/14/2019  . OSA (obstructive sleep apnea) 02/14/2019  . Spinal stenosis 02/14/2019  . Diabetes mellitus type 2, uncontrolled, with complications ([place]) 98/33/8250  . Diarrhea 12/11/2018  . Colitis 11/29/2018  . Altered mental status   . Diarrhea of presumed infectious origin   . Hyponatremia   . Hypotension   . Hematochezia   . Diverticulosis of colon with hemorrhage   . Nodule of upper lobe of left lung 10/09/2018  . Abdominal pain, left lower quadrant 05/22/2018  . Rectal bleeding 05/22/2018  . Weakness due to cerebrovascular accident (CVA) 02/28/2017  . Cerebrovascular disease 02/27/2017  . RUQ pain 05/06/2013  . Abdominal pain, epigastric 05/06/2013  . Esophageal dysphagia 05/06/2013  . GERD (gastroesophageal reflux disease) 05/06/2013  . Family history of polyps in the colon 05/06/2013  . DYSPNEA 09/01/2009  . MIXED HYPERLIPIDEMIA 08/05/2009  . HYPERTENSION, BENIGN ESSENTIAL 08/05/2009  . PRECORDIAL PAIN 08/05/2009    Past Surgical History:  Procedure Laterality Date  . BACK SURGERY    . CARPAL TUNNEL RELEASE Right   . COLONOSCOPY N/A 05/29/2013   Focal left colonic inflammation, likely remnant of recent bout of ischemic or segmental colitis, s/p biopsy. pancolonic diverticulosis. Due for  surveillance in 5 years  . COLONOSCOPY WITH PROPOFOL N/A 07/26/2018   Procedure: COLONOSCOPY WITH PROPOFOL;  Surgeon: Daneil Dolin, MD;  Location: [facility] ENDO SU[facility];  Service: Endoscopy;  Laterality: N/A;  8:30am  . ESOPHAGOGASTRODUODENOSCOPY (EGD) WITH ESOPHAGEAL DILATION N/A 05/29/2013   normal appearing patent tubular esophagus, small hiatal hernia, multiple antral erosions. no ulcer. normlal  duodenum. empiric dilation  . ILIOTIBIAL BAND RELEASE Left   . NECK SURGERY    . POLYPECTOMY  07/26/2018   Procedure: POLYPECTOMY;  Surgeon: Daneil Dolin, MD;  Location: [facility] ENDO SUITE;  Service: Endoscopy;;  hepatic flexure polyp  . TOTAL ABDOMINAL HYSTERECTOMY       OB History   No obstetric history on file.      Home Medications    Prior to Admission medications   Medication Sig Start Date End Date Taking? Authorizing Provider  acetaminophen (TYLENOL) 500 MG tablet Take 1,000 mg by mouth 2 (two) times daily as needed for mild pain or moderate pain.     [provider]  Ascorbic Acid (VITAMIN C) 1000 MG tablet Take 1,000-2,000 mg by mouth daily.     [provider]  aspirin EC 81 MG tablet Take 1 tablet (81 mg total) by mouth daily. 02/15/19 02/15/20  Manuella Ghazi, Pratik D, DO  atenolol (TENORMIN) 100 MG tablet Take 100 mg by mouth every morning.     [provider]  Biotin 5000 MCG TABS Take 1 tablet by mouth daily.    [provider]  Cholecalciferol (VITAMIN D) 125 MCG (5000 UT) CAPS Take 1 capsule by mouth daily.     [provider]  citalopram (CELEXA) 20 MG tablet Take 20 mg by mouth every other day.     [provider]  clopidogrel (PLAVIX) 75 MG tablet Take 1 tablet (75 mg total) by mouth daily. 02/15/19 02/15/20  Manuella Ghazi, Pratik D, DO  folic acid (FOLVITE) 924 MCG tablet Take 800 mcg by mouth daily.     [provider]  glimepiride (AMARYL) 1 MG tablet Take 1 mg by mouth daily before breakfast.  04/23/13   [provider]  HYDROcodone-acetaminophen (NORCO/VICODIN) 5-325 MG tablet Take 1 tablet by mouth every 4 (four) hours as needed for moderate pain.  03/10/17   [provider]  levothyroxine (SYNTHROID) 100 MCG tablet Take 100 mcg by mouth daily before breakfast.    [provider]  lisinopril (PRINIVIL,ZESTRIL) 10 MG tablet Take 1 tablet (10 mg total) by mouth daily for 30 days. 12/03/18 01/31/19   Johnson, Clanford L, MD  loratadine (CLARITIN) 10 MG tablet Take 10 mg by mouth daily.    [provider]  metFORMIN (GLUCOPHAGE) 1000 MG tablet Take 1,000 mg by mouth 2 (two) times daily with a meal.  12/01/16   [provider]  methocarbamol (ROBAXIN) 750 MG tablet Take 750 mg by mouth daily. Takes 1/4 tablet only    [provider]  NONFORMULARY OR COMPOUNDED ITEM Apply 1 application topically 3 (three) times daily as needed (Pain). Diclofenac, Baclofen, Gabapentin, Lidocaine, Menthol - Compounded at [employer], Historical, MD  omeprazole (PRILOSEC) 20 MG capsule Take 20 mg by mouth daily.     [provider]  polyethylene glycol (MIRALAX / GLYCOLAX) packet Take 17 g by mouth daily as needed for moderate constipation or severe constipation. 12/02/18   Johnson, Clanford L, MD  simvastatin (ZOCOR) 10 MG tablet Take 1 tablet (10 mg total) by mouth daily at 6  PM for 30 days. 02/16/19 08/07/19  Manuella Ghazi, Pratik D, DO  vitamin B-12 (CYANOCOBALAMIN) 1000 MCG tablet Take 1,000 mcg by mouth daily.    [provider]    Family History Family History  Problem Relation Age of Onset  . Heart disease Mother        MI  . Heart disease Father   . Heart attack Father 5  . Heart disease Brother   . Cancer Brother        ADRENAL GLAND  . Colon polyps Brother   . Liver cancer Brother 32       deceased , ?metastatic cancer?   . Heart disease Maternal Grandfather        MI  . Cancer Paternal Grandfather        LUNG  . Colon cancer Neg Hx     Social History Social History   Tobacco Use  . Smoking status: Former Smoker    Packs/day: 2.00    Years: 13.00    Pack years: 26.00    Quit date: 07/19/1966    Years since quitting: 53.2  . Smokeless tobacco: Former Systems developer    Types: Chew  Substance Use Topics  . Alcohol use: No  . Drug use: No     Allergies   Celecoxib, Codeine, Dilaudid [hydromorphone hcl], Morphine, Statins, and  Tetracycline   Review of Systems Review of Systems Ten systems are reviewed and are negative for acute change except as noted in the HPI   Physical Exam Updated Vital Signs BP (!) 178/87 (BP Location: Left Arm) Comment: pt failed to take BP meds this am  Pulse 68   Temp 98 F (36.7 C) (Oral)   Resp 16   Ht 5' 5"  (1.651 m)   Wt 76.2 kg   SpO2 100%   BMI 27.96 kg/m   Physical Exam Constitutional:      General: She is not in acute distress.    Appearance: Normal appearance. She is well-developed. She is not ill-appearing or diaphoretic.  HENT:     Head: Normocephalic and atraumatic.     Right Ear: External ear normal.     Left Ear: External ear normal.     Nose: Nose normal.  Eyes:     General: Vision grossly intact. Gaze aligned appropriately.     Pupils: Pupils are equal, round, and reactive to light.  Neck:     Musculoskeletal: Normal range of motion.     Trachea: Trachea and phonation normal. No tracheal deviation.  Cardiovascular:     Rate and Rhythm: Normal rate and regular rhythm.     Pulses: Normal pulses.          Dorsalis pedis pulses are 2+ on the right side and 2+ on the left side.     Heart sounds: Normal heart sounds.  Pulmonary:     Effort: Pulmonary effort is normal. No respiratory distress.     Breath sounds: Normal breath sounds.  Abdominal:     General: There is no distension.     Palpations: Abdomen is soft.     Tenderness: There is no abdominal tenderness. There is no guarding or rebound.  Musculoskeletal: Normal range of motion.     Right lower leg: Normal.     Left lower leg: Normal.     Comments: No midline C/T/L spinal tenderness to palpation, no paraspinal muscle tenderness, no deformity, crepitus, or step-off noted. No sign of injury to the neck or back.  Feet:  Right foot:     Protective Sensation: 3 sites tested. 3 sites sensed.     Left foot:     Protective Sensation: 3 sites tested. 3 sites sensed.  Skin:    General: Skin is warm  and dry.  Neurological:     Mental Status: She is alert.     GCS: GCS eye subscore is 4. GCS verbal subscore is 5. GCS motor subscore is 6.     Comments: Speech is clear and goal oriented, follows commands Major Cranial nerves without deficit, no facial droop Moves extremities without ataxia, coordination intact  Psychiatric:        Behavior: Behavior normal.    ED Treatments / Results  Labs (all labs ordered are listed, but only abnormal results are displayed) Labs Reviewed  COMPREHENSIVE METABOLIC PANEL - Abnormal; Notable for the following components:      Result Value   Sodium 133 (*)    Glucose, Bld 100 (*)    All other components within normal limits  LIPASE, BLOOD - Abnormal; Notable for the following components:   Lipase 68 (*)    All other components within normal limits  URINALYSIS, ROUTINE W REFLEX MICROSCOPIC  CBC WITH DIFFERENTIAL/PLATELET    EKG None  Radiology Ct Abdomen Pelvis W Contrast  Result Date: 09/21/2019 CLINICAL DATA:  Nausea, vomiting EXAM: CT ABDOMEN AND PELVIS WITH CONTRAST TECHNIQUE: Multidetector CT imaging of the abdomen and pelvis was performed using the standard protocol following bolus administration of intravenous contrast. CONTRAST:  166m OMNIPAQUE IOHEXOL 300 MG/ML  SOLN COMPARISON:  01/08/2019 FINDINGS: Lower chest: No acute abnormality. Hepatobiliary: No solid liver abnormality is seen. Hepatic steatosis. No gallstones, gallbladder wall thickening, or biliary dilatation. Pancreas: Unremarkable. No pancreatic ductal dilatation or surrounding inflammatory changes. Spleen: Normal in size without significant abnormality. Adrenals/Urinary Tract: Adrenal glands are unremarkable. Kidneys are normal, without renal calculi, solid lesion, or hydronephrosis. Bladder is unremarkable. Stomach/Bowel: Stomach is within normal limits. Appendix appears normal. No evidence of bowel wall thickening, distention, or inflammatory changes. Occasional colonic  diverticula. Vascular/Lymphatic: Aortic atherosclerosis. No enlarged abdominal or pelvic lymph nodes. Reproductive: Status post hysterectomy. Other: No abdominal wall hernia or abnormality. No abdominopelvic ascites. Musculoskeletal: Dextroscoliosis of the thoracic spine with associated disc and facet degenerative disease. IMPRESSION: 1. No CT findings of the abdomen or pelvis to explain nausea or vomiting. 2.  Colonic diverticulosis without evidence of acute diverticulitis. 3.  Aortic Atherosclerosis (ICD10-I70.0). Electronically Signed   By: AEddie CandleM.D.   On: 09/21/2019 14:33    Procedures Procedures (including critical care time)  Medications Ordered in ED Medications  iohexol (OMNIPAQUE) 300 MG/ML solution 100 mL (100 mLs Intravenous Contrast Given 09/21/19 1414)  sodium chloride 0.9 % bolus 500 mL (500 mLs Intravenous New Bag/Given 09/21/19 1439)     Initial Impression / Assessment and Plan / ED Course  I have reviewed the triage vital signs and the nursing notes.  Pertinent labs & imaging results that were available during my care of the patient were reviewed by me and considered in my medical decision making (see chart for details).    CBC within normal limits CMP, sodium 133, glucose 100 otherwise within normal limits Lipase 68, patient without left upper quadrant tenderness, no history of pancreatitis/EtOH abuse, doubt acute pancreatitis at this time Urinalysis within normal limits CT abdomen pelvis:  IMPRESSION:  1. No CT findings of the abdomen or pelvis to explain nausea or  vomiting.    2. Colonic diverticulosis without evidence  of acute diverticulitis.    3. Aortic Atherosclerosis (ICD10-I70.0).  - Patient seen and evaluated by Dr. Lacinda Axon during this visit, plan of care is for patient to complete her Cipro for possibly resolving urinary tract infection, follow-up with PCP and ensure adequate water intake.  No indication for further imaging or work-up at this  time. - Patient reassessed resting comfortably no acute distress, patient informed of results today including incidental findings and she states understanding.  She has no questions also like to be discharged.  I have advised her to complete her antibiotic and to increase her water intake and get plenty of rest and to call her PCP today and schedule follow-up appointment within 1 week.  She understands to return to emergency department immediately for any new or worsening symptoms.  Patient noted to have elevated blood pressure reading here in the emergency department, she has not taken her daily blood pressure medications this morning which she has some at home and does not need refill.  She is asymptomatic regarding hypertension today.  I encouraged patient to take blood pressure medication as prescribed by PCP and return to the ER if she develops any signs of hypertensive urgency/emergency and she states understanding.  She will follow-up with PCP within 1 week for blood pressure recheck and medication management as indicated.  At this time there does not appear to be any evidence of an acute emergency medical condition and the patient appears stable for discharge with appropriate outpatient follow up. Diagnosis was discussed with patient who verbalizes understanding of care plan and is agreeable to discharge. I have discussed return precautions with patient and family who verbalizes understanding of return precautions. Patient encouraged to follow-up with their PCP. All questions answered.  Patient has been discharged in good condition.  Note: Portions of this report may have been transcribed using voice recognition software. Every effort was made to ensure accuracy; however, inadvertent computerized transcription errors may still be present. Final Clinical Impressions(s) / ED Diagnoses   Final diagnoses:  Nausea    ED Discharge Orders    None       Gari Crown 09/21/19 1521     Nat Christen, MD 09/22/19 [phone]

## 2019-11-28 DIAGNOSIS — M5116 Intervertebral disc disorders with radiculopathy, lumbar region: Secondary | ICD-10-CM | POA: Diagnosis not present

## 2019-11-28 DIAGNOSIS — M48061 Spinal stenosis, lumbar region without neurogenic claudication: Secondary | ICD-10-CM | POA: Diagnosis not present

## 2019-11-28 DIAGNOSIS — M4186 Other forms of scoliosis, lumbar region: Secondary | ICD-10-CM | POA: Diagnosis not present

## 2019-12-25 DIAGNOSIS — H2513 Age-related nuclear cataract, bilateral: Secondary | ICD-10-CM | POA: Diagnosis not present

## 2019-12-25 IMAGING — CT CT CTA ABD/PEL W/CM AND/OR W/O CM
3 of 9 series · 11 of 46 positions shown, 17 images · IV contrast (iopamidol)
Comparison: CT of the abdomen and pelvis on 04/15/2016

CLINICAL DATA: Nausea, vomiting, diarrhea and blood in stool.
History of ischemic colitis of the descending colon by prior
colonoscopy and biopsy.

EXAM:
CT ANGIOGRAPHY ABDOMEN AND PELVIS WITH CONTRAST AND WITHOUT CONTRAST
TECHNIQUE: Multidetector CT imaging of the abdomen and pelvis was performed
using the standard protocol during bolus administration of
intravenous contrast. Multiplanar reconstructed images and MIPs were
obtained and reviewed to evaluate the vascular anatomy.
CONTRAST:  100mL UFXXKU-RA0 IOPAMIDOL (UFXXKU-RA0) INJECTION 76%

[Series 4: mesenteric axial arterial · axial · arterial · 0.78mm/px · z∈[-492,-444]mm · 2 of 239 slices shown]
[im 24/239  soft-tissue]
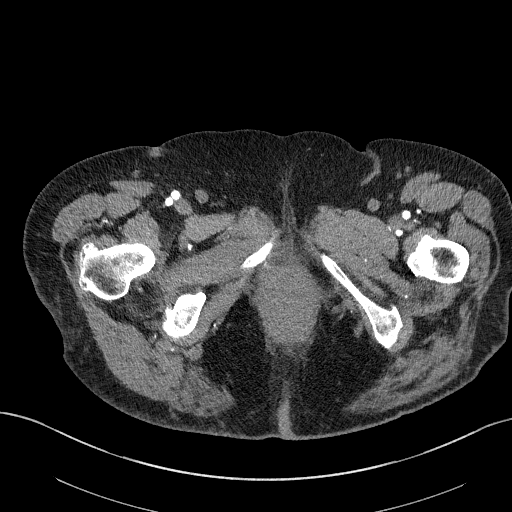
[im 48/239  soft-tissue]
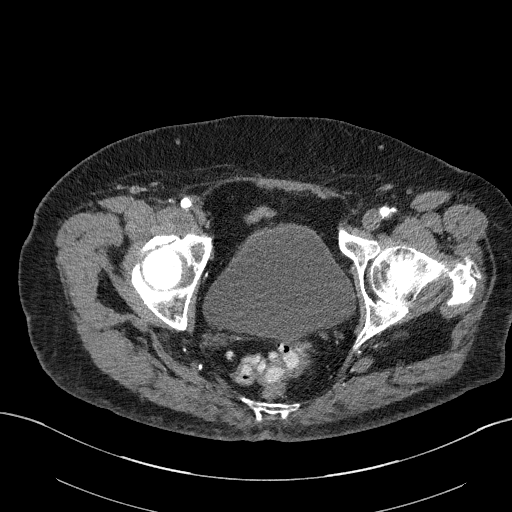

[Series 7: coronals · coronal · 0.91mm/px · 2 of 89 slices shown, 3 images]
[im 30/89  soft-tissue]
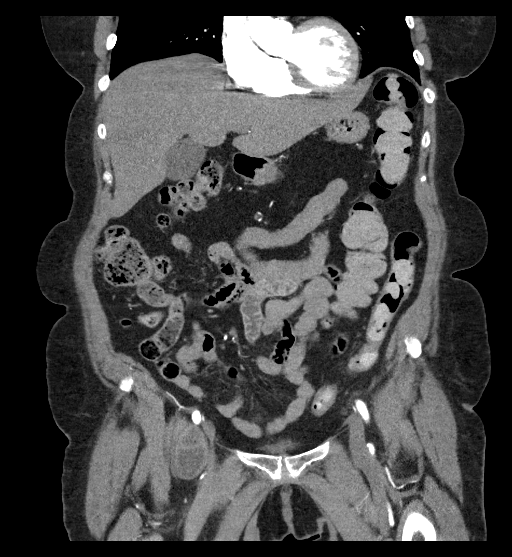
[im 30/89  bone]
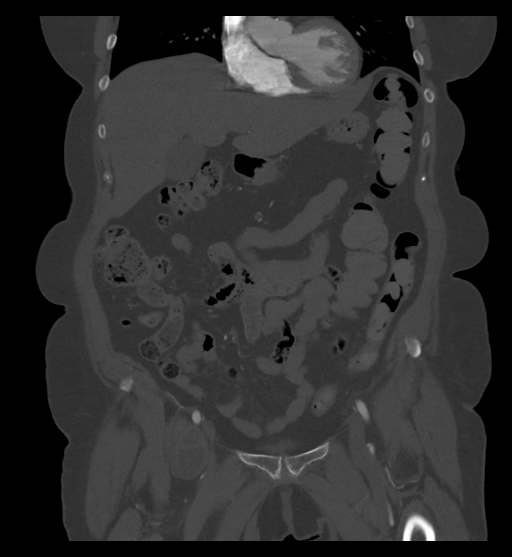
[im 59/89  soft-tissue]
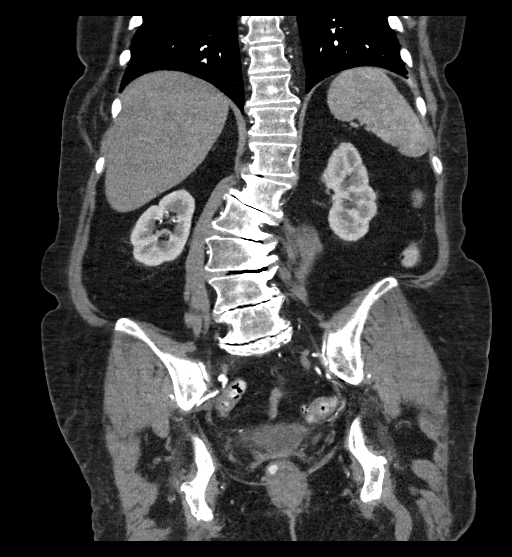

[Series 11: axial venous · axial · portal-venous · 0.76mm/px · z∈[-482,-122]mm · 7 of 96 slices shown, 12 images]
[im 12/96  soft-tissue]
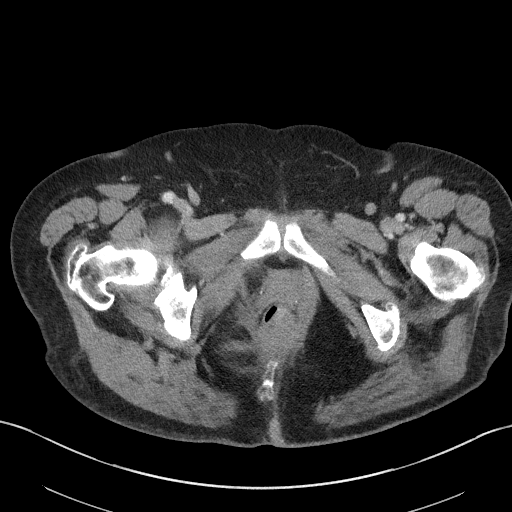
[im 12/96  bone]
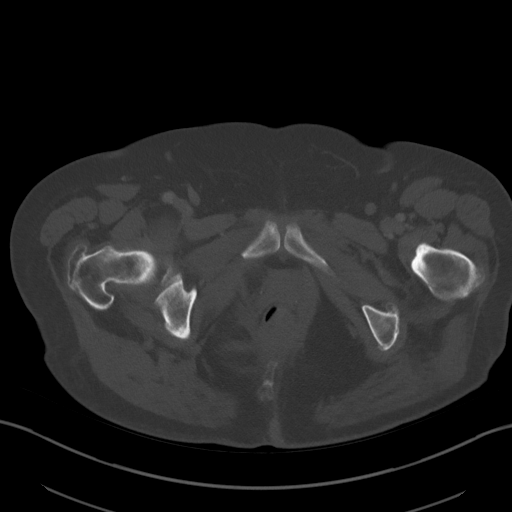
[im 24/96  soft-tissue]
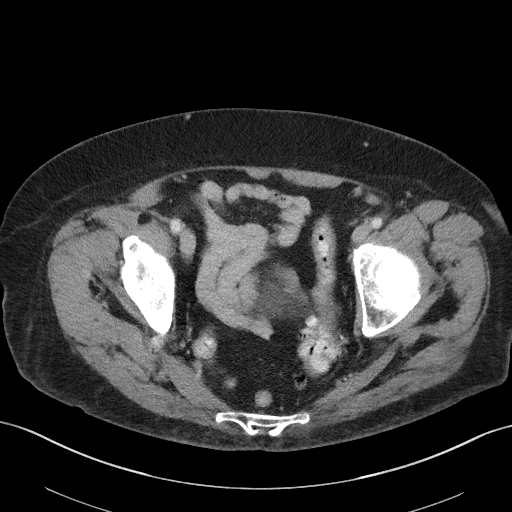
[im 36/96  soft-tissue]
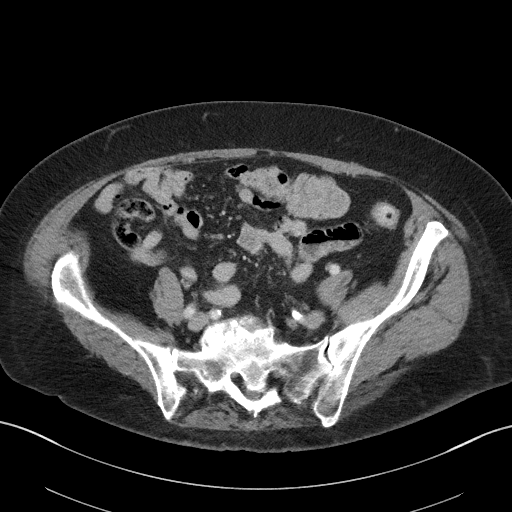
[im 48/96  soft-tissue]
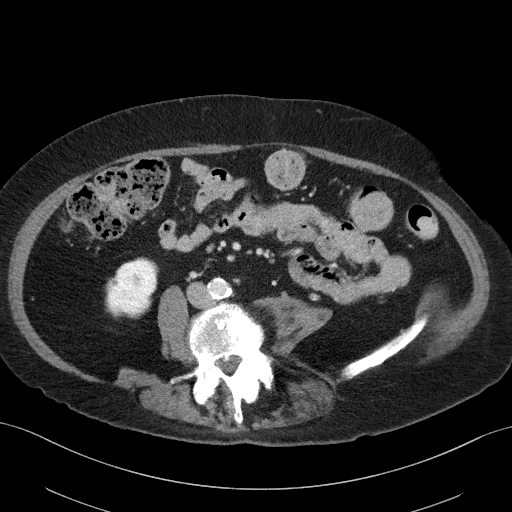
[im 48/96  lung]
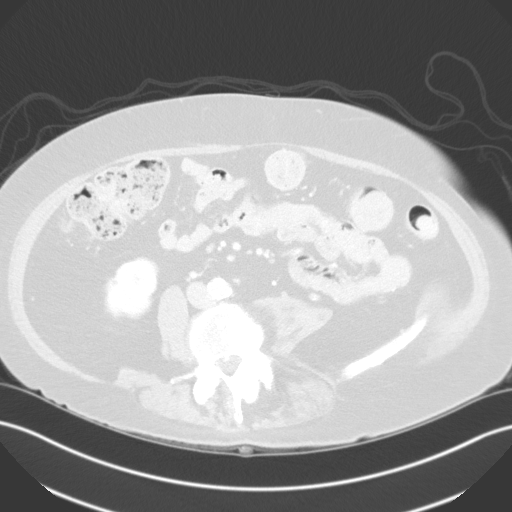
[im 60/96  soft-tissue]
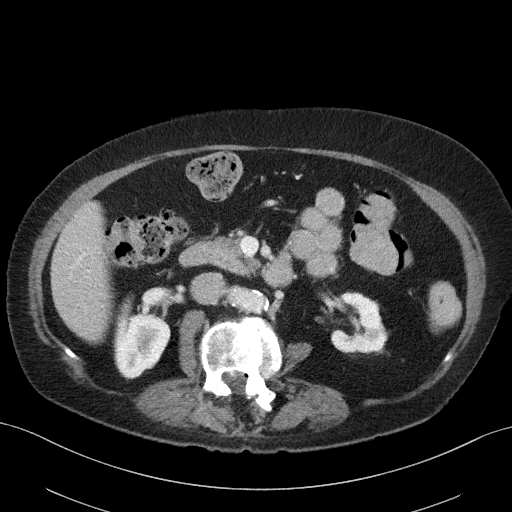
[im 60/96  lung]
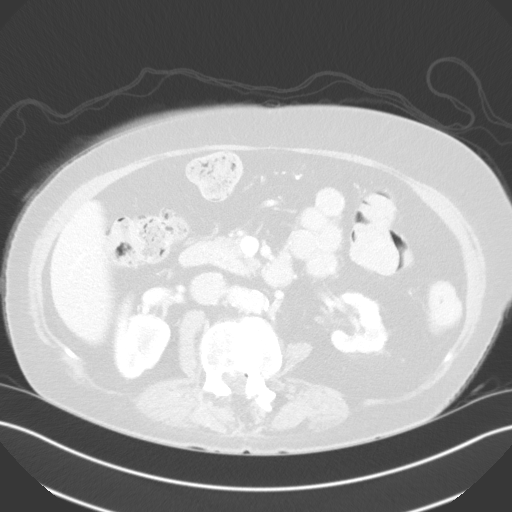
[im 72/96  soft-tissue]
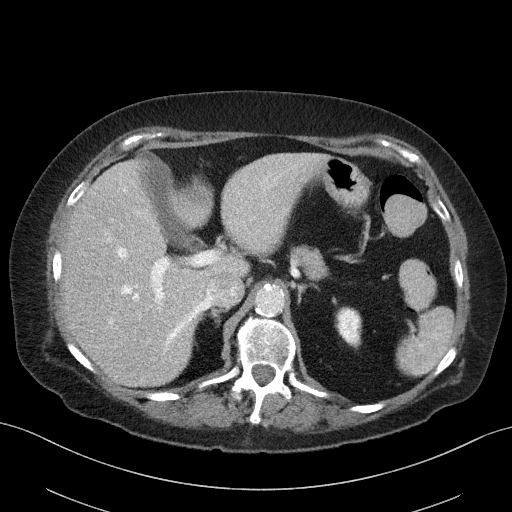
[im 72/96  lung]
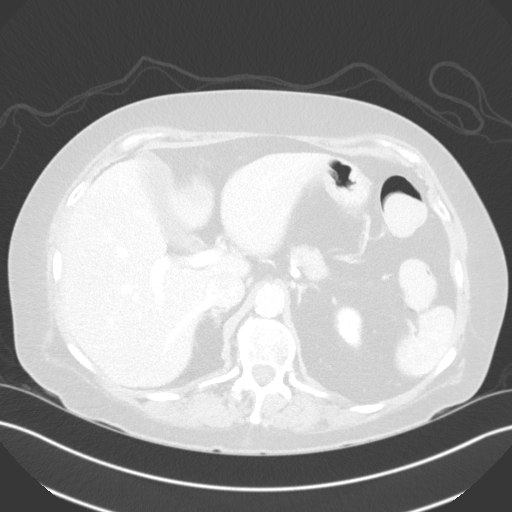
[im 84/96  soft-tissue]
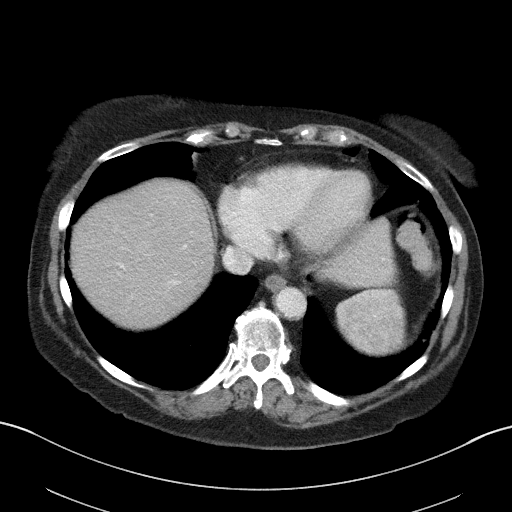
[im 84/96  lung]
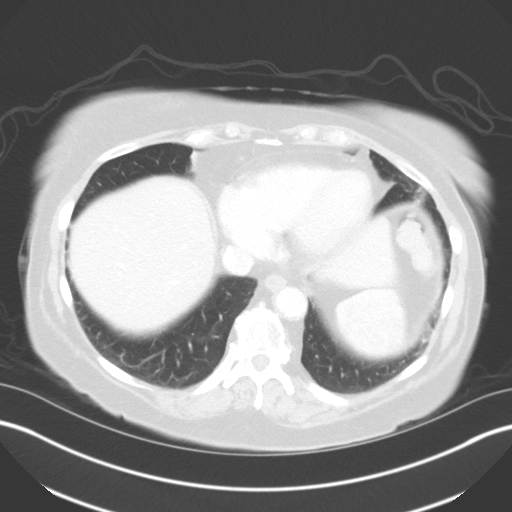

[11 of 46 positions shown; findings below may reference images not displayed]

FINDINGS: VASCULAR

Aorta: Stable atherosclerosis of the abdominal aorta and tortuosity
without evidence of aneurysmal disease or aortic stenosis. No
dissection. No evidence of vasculitis.

Celiac: Calcified plaque at the origin of the celiac axis causes
irregular stenosis of likely greater than 70% narrowing. Distal
branches are patent and demonstrate normal branching anatomy.

SMA: Calcified plaque at the origin of the SMA does not cause
significant stenosis (less than 30%). Distal branches are patent.

Renals: Bilateral single renal arteries are present. Calcified
plaque causes approximately 50% proximal stenosis of the left renal
artery. There is no significant stenosis of the right renal artery.

IMA: Normally patent.

Inflow: Bilateral iliac arteries show tortuosity without evidence of
significant obstructive disease or aneurysmal disease.

Proximal Outflow: Common femoral arteries and femoral bifurcations
are normally patent.

Veins: Venous phase imaging shows normally patent mesenteric veins,
splenic vein and portal vein. The IVC and iliac veins show normal
patency.

Review of the MIP images confirms the above findings.

NON-VASCULAR

Lower chest: No acute abnormality.

Hepatobiliary: No focal liver abnormality is seen. No gallstones,
gallbladder wall thickening, or biliary dilatation.

Pancreas: Unremarkable. No pancreatic ductal dilatation or
surrounding inflammatory changes.

Spleen: Normal in size without focal abnormality.

Adrenals/Urinary Tract: Adrenal glands are unremarkable. Kidneys are
normal, without renal calculi, focal lesion, or hydronephrosis.
Bladder is unremarkable.

Stomach/Bowel: Bowel shows no evidence of obstruction, inflammation
or lesion. No signs of overt ischemia. No pneumatosis. Stable
diverticulosis of the sigmoid colon. The appendix is normal. No free
air.

Lymphatic: No enlarged lymph nodes identified in the abdomen or
pelvis.

Reproductive: Status post hysterectomy. No adnexal masses.

Other: No abdominal wall hernia or abnormality. No abdominopelvic
ascites.

Musculoskeletal: Advanced degenerative disc disease is seen
throughout the lumbar spine with associated rightward convex
degenerative scoliosis. No fractures or bony lesions identified.
IMPRESSION: VASCULAR

1. Significant stenosis of likely greater than 70% at the origin of
the celiac axis. The SMA and IMA show no significant obstructive
disease.
2. Proximal left renal artery stenosis of approximately 50%
narrowing.

NON-VASCULAR

1. No acute findings. No evidence of acute bowel pathology or
secondary signs bowel ischemia by CT.
2. Advanced degenerative disc disease of the lumbar spine with
associated degenerative scoliosis.

## 2020-01-01 ENCOUNTER — Other Ambulatory Visit: Payer: Self-pay | Admitting: Cardiothoracic Surgery

## 2020-01-01 DIAGNOSIS — R911 Solitary pulmonary nodule: Secondary | ICD-10-CM

## 2020-01-15 ENCOUNTER — Other Ambulatory Visit: Payer: Self-pay

## 2020-01-15 ENCOUNTER — Ambulatory Visit (HOSPITAL_COMMUNITY)
Admission: RE | Admit: 2020-01-15 | Discharge: 2020-01-15 | Disposition: A | Discharge status: Home / Self Care | Payer: Medicare Other | Source: Ambulatory Visit | Attending: Family Medicine | Admitting: Family Medicine

## 2020-01-15 ENCOUNTER — Other Ambulatory Visit (HOSPITAL_COMMUNITY): Payer: Self-pay | Admitting: Family Medicine

## 2020-01-15 DIAGNOSIS — M79641 Pain in right hand: Secondary | ICD-10-CM | POA: Diagnosis not present

## 2020-01-15 DIAGNOSIS — S6991XA Unspecified injury of right wrist, hand and finger(s), initial encounter: Secondary | ICD-10-CM | POA: Insufficient documentation

## 2020-01-15 DIAGNOSIS — Z6828 Body mass index (BMI) 28.0-28.9, adult: Secondary | ICD-10-CM | POA: Diagnosis not present

## 2020-01-15 DIAGNOSIS — E663 Overweight: Secondary | ICD-10-CM | POA: Diagnosis not present

## 2020-01-19 DIAGNOSIS — M1990 Unspecified osteoarthritis, unspecified site: Secondary | ICD-10-CM | POA: Diagnosis not present

## 2020-01-19 DIAGNOSIS — E119 Type 2 diabetes mellitus without complications: Secondary | ICD-10-CM | POA: Diagnosis not present

## 2020-01-19 DIAGNOSIS — I10 Essential (primary) hypertension: Secondary | ICD-10-CM | POA: Diagnosis not present

## 2020-01-19 DIAGNOSIS — E782 Mixed hyperlipidemia: Secondary | ICD-10-CM | POA: Diagnosis not present

## 2020-02-13 ENCOUNTER — Ambulatory Visit: Payer: Medicare Other | Admitting: Cardiothoracic Surgery

## 2020-02-13 ENCOUNTER — Other Ambulatory Visit: Payer: Medicare Other

## 2020-02-13 NOTE — Progress Notes (Deleted)
[place] Record #[id number] Date of Birth: [birth year]  Referring: [name], MD Primary Care: [name], MD Primary Cardiologist: No primary care provider on file.  Chief Complaint:    Follow-up CT scan of the chest for left upper lobe lung nodule   History of Present Illness:    Brittany Goodman 74 y.o. female had recent super D CT for evaluation of left upper lobe lung abnormality.  The patient was originally rprecipitated by a visit to the emergency room when she noted extensive bruising along her left neck left anterior chest and down along the left side of her chest.  She denies any specific trauma.  She had been taking high-dose aspirin at.  This was treated without any specific intervention other than discontinuing the aspirin.  Incidentally a CT scan suggested an irregular mass in the left upper lobe.  She had a previous history of a left upper lobe mass that she says was followed for several years with CT scan.  On review of her history and films a CT scan from 2015 was located.  The report on that scan does not mention left upper lobe irregularity but on review of the film there is an area of irregular mass in the area of question that persists now.  It does appear slightly enlarged over 4 years.   Patient is a distant smoker having smoked for approximately 10 years and quitting in 1973.  She denies any history of other malignancy.    Current Activity/ Functional Status:  Patient is independent with mobility/ambulation, transfers, ADL's, IADL's.   Zubrod Score: At the time of surgery this patient's most appropriate activity status/level should be described as: []     0    Normal activity, no symptoms []     1    Restricted in physical strenuous activity but ambulatory, able to do out light work []     2    Ambulatory and capable of self care, unable to do work activities, up and about               >50 % of waking hours                               []     3    Only limited self care, in bed greater than 50% of waking hours []     4    Completely disabled, no self care, confined to bed or chair []     5    Moribund   Past Medical History:  Diagnosis Date  . Arthritis   . CVA (cerebral vascular accident) ([place]) 02/14/2019  . Depression   . Depression 02/14/2019  . Diabetes mellitus type 2, uncontrolled, with complications ([place]) 08/06/9449  . Diabetes mellitus type II   . Diverticulosis   . GERD (gastroesophageal reflux disease)   . History of colitis   . Hyperlipidemia   . Hypertension   . Hypothyroidism   . OSA (obstructive sleep apnea) 02/14/2019  . Sepsis ([name])    2011, Escherichia coli pyelonephritis  . Sleep apnea    Does not use CPAP. Cannot tolerate  . Spinal stenosis   . Spinal stenosis 02/14/2019  . Stroke [facility])    no deficits    Past Surgical History:  Procedure Laterality Date  .  BACK SURGERY    . CARPAL TUNNEL RELEASE Right   . COLONOSCOPY N/A 05/29/2013   Focal left colonic inflammation, likely remnant of recent bout of ischemic or segmental colitis, s/p biopsy. pancolonic diverticulosis. Due for surveillance in 5 years  . COLONOSCOPY WITH PROPOFOL N/A 07/26/2018   Procedure: COLONOSCOPY WITH PROPOFOL;  Surgeon: Daneil Dolin, MD;  Location: [facility];  Service: Endoscopy;  Laterality: N/A;  8:30am  . ESOPHAGOGASTRODUODENOSCOPY (EGD) WITH ESOPHAGEAL DILATION N/A 05/29/2013   normal appearing patent tubular esophagus, small hiatal hernia, multiple antral erosions. no ulcer. normlal duodenum. empiric dilation  . ILIOTIBIAL BAND RELEASE Left   . NECK SURGERY    . POLYPECTOMY  07/26/2018   Procedure: POLYPECTOMY;  Surgeon: Daneil Dolin, MD;  Location: [facility];  Service: Endoscopy;;  hepatic flexure polyp  . TOTAL ABDOMINAL HYSTERECTOMY      Family History  Problem Relation Age of Onset  . Heart disease Mother        MI  . Heart disease Father   . Heart attack Father 79  . Heart  disease Brother   . Cancer Brother        ADRENAL GLAND  . Colon polyps Brother   . Liver cancer Brother 38       deceased , ?metastatic cancer?   . Heart disease Maternal Grandfather        MI  . Cancer Paternal Grandfather        LUNG  . Colon cancer Neg Hx      Social History   Tobacco Use  Smoking Status Former Smoker  . Packs/day: 2.00  . Years: 13.00  . Pack years: 26.00  . Quit date: 07/19/1966  . Years since quitting: 53.6  Smokeless Tobacco Former Systems developer  . Types: Chew    Social History   Substance and Sexual Activity  Alcohol Use No     Allergies  Allergen Reactions  . Celecoxib Swelling    Pt states she can tolerate ibuprofen  . Codeine Other (See Comments)    "makes me feel bad"  . Dilaudid [Hydromorphone Hcl] Other (See Comments)    hallucination  . Morphine Nausea And Vomiting  . Statins     Muscle pain  . Tetracycline Nausea And Vomiting    Current Outpatient Medications  Medication Sig Dispense Refill  . acetaminophen (TYLENOL) 500 MG tablet Take 1,000 mg by mouth 2 (two) times daily as needed for mild pain or moderate pain.     . Ascorbic Acid (VITAMIN C) 1000 MG tablet Take 1,000-2,000 mg by mouth daily.     Marland Kitchen aspirin EC 81 MG tablet Take 1 tablet (81 mg total) by mouth daily. 150 tablet 2  . atenolol (TENORMIN) 100 MG tablet Take 100 mg by mouth every morning.     . Biotin 5000 MCG TABS Take 1 tablet by mouth daily.    . Cholecalciferol (VITAMIN D) 125 MCG (5000 UT) CAPS Take 1 capsule by mouth daily.     . citalopram (CELEXA) 20 MG tablet Take 20 mg by mouth every other day.     . clopidogrel (PLAVIX) 75 MG tablet Take 1 tablet (75 mg total) by mouth daily. 30 tablet 11  . folic acid (FOLVITE) 720 MCG tablet Take 800 mcg by mouth daily.     Marland Kitchen glimepiride (AMARYL) 1 MG tablet Take 1 mg by mouth daily before breakfast.     . HYDROcodone-acetaminophen (NORCO/VICODIN) 5-325 MG tablet Take 1 tablet by  mouth every 4 (four) hours as needed for  moderate pain.   0  . levothyroxine (SYNTHROID) 100 MCG tablet Take 100 mcg by mouth daily before breakfast.    . lisinopril (PRINIVIL,ZESTRIL) 10 MG tablet Take 1 tablet (10 mg total) by mouth daily for 30 days. 30 tablet 0  . loratadine (CLARITIN) 10 MG tablet Take 10 mg by mouth daily.    . metFORMIN (GLUCOPHAGE) 1000 MG tablet Take 1,000 mg by mouth 2 (two) times daily with a meal.     . methocarbamol (ROBAXIN) 750 MG tablet Take 750 mg by mouth daily. Takes 1/4 tablet only    . NONFORMULARY OR COMPOUNDED ITEM Apply 1 application topically 3 (three) times daily as needed (Pain). Diclofenac, Baclofen, Gabapentin, Lidocaine, Menthol - Compounded at [facility]    . omeprazole (PRILOSEC) 20 MG capsule Take 20 mg by mouth daily.     . polyethylene glycol (MIRALAX / GLYCOLAX) packet Take 17 g by mouth daily as needed for moderate constipation or severe constipation. 30 each 0  . simvastatin (ZOCOR) 10 MG tablet Take 1 tablet (10 mg total) by mouth daily at 6 PM for 30 days. 30 tablet 2  . vitamin B-12 (CYANOCOBALAMIN) 1000 MCG tablet Take 1,000 mcg by mouth daily.     No current facility-administered medications for this visit.    Although listed on the patient's medicine profile she notes that since the bleeding and bruising episode along the left neck and left chest she has not taken any other aspirin.  Pertinent items are noted in HPI.        PHYSICAL EXAMINATION: There were no vitals taken for this visit. {physical exam:21449}  Diagnostic Studies & Laboratory data:     Recent Radiology Findings:   Nm Pet Image Initial (pi) Skull Base To Thigh  Result Date: 08/02/2019 CLINICAL DATA:  Initial treatment strategy for solitary pulmonary nodule. EXAM: NUCLEAR MEDICINE PET SKULL BASE TO THIGH TECHNIQUE: 8.4 mCi F-18 FDG was injected intravenously. Full-ring PET imaging was performed from the skull base to thigh after the radiotracer. CT data was obtained and used for attenuation  correction and anatomic localization. Fasting blood glucose: 72 mg/dl COMPARISON:  CT 07/25/2019 FINDINGS: Mediastinal blood pool activity: SUV max 2.1 Liver activity: SUV max NA NECK: No hypermetabolic lymph nodes in the neck. Incidental CT findings: none CHEST: Irregular LEFT apical nodule along the pleural surface measuring 9 mm (image 48/4) corresponds to the nodule of concern on comparison CT . This nodule has low metabolic activity (SUV max equal 1.9). No additional hypermetabolic pulmonary nodules. No hypermetabolic mediastinal lymph nodes. Incidental CT findings: Coronary artery calcification and aortic atherosclerotic calcification. ABDOMEN/PELVIS: No abnormal hypermetabolic activity within the liver, pancreas, adrenal glands, or spleen. No hypermetabolic lymph nodes in the abdomen or pelvis. Diffuse activity within colon and distal small bowel is physiologic. Incidental CT findings: Post hysterectomy Atherosclerotic calcification of the aorta. SKELETON: No focal hypermetabolic activity to suggest skeletal metastasis. Incidental CT findings: none IMPRESSION: 1. Low metabolic activity associated with the LEFT upper lobe pulmonary nodule favors benign etiology. Recommend follow-up CT in 6-12 months. If lesion continues to increase in size, consider tissue sampling. 2.  Aortic Atherosclerosis (ICD10-I70.0). Electronically Signed   By: Suzy Bouchard M.D.   On: 08/02/2019 11:36   Ct Angio Chest Aorta W &/or Wo Contrast  Ct Angio Chest Aorta W &/or Wo Contrast  Result Date: 07/25/2019 CLINICAL DATA:  74 year old female with history of non-small cell lung cancer. Follow-up study.  EXAM: CT ANGIOGRAPHY CHEST WITH CONTRAST TECHNIQUE: Multidetector CT imaging of the chest was performed using the standard protocol during bolus administration of intravenous contrast. Multiplanar CT image reconstructions and MIPs were obtained to evaluate the vascular anatomy. CONTRAST:  75m ISOVUE-370 IOPAMIDOL (ISOVUE-370)  INJECTION 76% COMPARISON:  Chest CT 01/31/2019. FINDINGS: [facility] [address] Results: Creatinine 0.8 mg/dL. Cardiovascular: Heart size is normal. There is no significant pericardial fluid, thickening or pericardial calcification. There is aortic atherosclerosis, as well as atherosclerosis of the great vessels of the mediastinum and the coronary arteries, including calcified atherosclerotic plaque in the left anterior descending and left circumflex coronary arteries. Mediastinum/Nodes: No pathologically enlarged mediastinal or hilar lymph nodes. Esophagus is unremarkable in appearance. No axillary lymphadenopathy. Lungs/Pleura: The previously noted nodule in the apex of the left upper lobe currently measures 1.9 x 1.0 x 1.5 cm and is a mixture of ground-glass attenuation and solid components, with the largest solid component measuring up to 1.2 cm (axial image 26 of series 7). No other new suspicious appearing pulmonary nodules or masses are noted. No acute consolidative airspace disease. No pleural effusions. Upper Abdomen: Aortic atherosclerosis. Musculoskeletal: There are no aggressive appearing lytic or blastic lesions noted in the visualized portions of the skeleton. Review of the MIP images confirms the above findings. IMPRESSION: 1. Previously noted left upper lobe nodule has slightly increased in size and has a mixed solid and sub solid appearance on today's examination measuring up to 1.9 x 1.0 x 1.5 cm, with a solid component measuring up to 1.2 cm. This is concerning for slow-growing neoplasm such as a primary bronchogenic adenocarcinoma. 2. Aortic atherosclerosis, in addition to 2 vessel coronary artery disease. Assessment for potential risk factor modification, dietary therapy or pharmacologic therapy may be warranted, if clinically indicated. Aortic Atherosclerosis (ICD10-I70.0). Electronically Signed   By: DVinnie LangtonM.D.   On: 07/25/2019  12:00   I have independently reviewed the above radiology studies  and reviewed the findings with the patient.  Ct Chest Wo Contrast  Result Date: 01/31/2019 CLINICAL DATA:  Follow-up pulmonary nodule, left upper lobe EXAM: CT CHEST WITHOUT CONTRAST TECHNIQUE: Multidetector CT imaging of the chest was performed following the standard protocol without IV contrast. COMPARISON:  Chest radiograph, 11/28/2018, CT chest, 09/01/2018, 03/20/2014 FINDINGS: Cardiovascular: Coronary artery calcifications and/or stents. Normal heart size. No pericardial effusion. Aortic atherosclerosis. Mediastinum/Nodes: No enlarged mediastinal, hilar, or axillary lymph nodes. Thyroid gland, trachea, and esophagus demonstrate no significant findings. Lungs/Pleura: Stable or minimally decreased size of an irregular nodule of the left pulmonary apex, measuring approximately 1.5 x 1.1 cm (series 8, image 22). New ground-glass opacities of the lateral right upper lobe, likely infectious or inflammatory and measuring up to 2.7 cm (series 8, image 67). No pleural effusion or pneumothorax. Upper Abdomen: No acute abnormality. Musculoskeletal: No chest wall mass or suspicious bone lesions identified. IMPRESSION: 1. Stable or minimally decreased size of an irregular nodule of the left pulmonary apex, measuring approximately 1.5 x 1.1 cm (series 8, image 22). 2. New ground-glass opacities of the lateral right upper lobe, likely infectious or inflammatory and measuring up to 2.7 cm (series 8, image 67). 3.  Coronary artery disease. Electronically Signed   By: AEddie CandleM.D.   On: 01/31/2019 15:23   Ct Angio Abd/pel W/ And/or W/o  Result Date: 01/08/2019 CLINICAL DATA:  Generalized abdominal pain, diarrhea and bloody stools. EXAM: CT ANGIOGRAPHY ABDOMEN AND PELVIS WITH CONTRAST TECHNIQUE:  Multidetector CT imaging of the abdomen and pelvis was performed using the standard protocol during bolus administration of intravenous contrast.  Multiplanar reconstructed images and MIPs were obtained and reviewed to evaluate the vascular anatomy. CONTRAST:  158m ISOVUE-370 IOPAMIDOL (ISOVUE-370) INJECTION 76% COMPARISON:  CT of the abdomen and pelvis with contrast on 11/28/2018 and CT of the chest on 09/01/2018. CTA of the abdomen and pelvis on 06/08/2018 FINDINGS: VASCULAR Aorta: Scattered atherosclerotic plaque and tortuosity without evidence of aneurysm or significant stenosis. Celiac: Heavily calcified plaque at the celiac origin likely causes significant stenosis of greater than 70%. This appears stable since July, 2019. The celiac trunk itself remains open with normal patency of distal branch vessels. SMA: Calcified plaque at origin without evidence of significant stenosis. There is some additional noncalcified plaque in the proximal trunk of the superior mesenteric artery that causes approximately 40% non critical narrowing. This appears stable since the prior CTA in July, 2019. Renals: Bilateral single renal arteries present. Calcified and noncalcified plaque in the proximal right renal artery does not cause significant stenosis. 50-60% narrowing at the origin of the left renal artery appears stable. IMA: Normally patent. Inflow: Scattered calcified plaque in bilateral iliac arteries without significant stenosis or evidence of aneurysmal disease. Proximal Outflow: Bilateral common femoral arteries and femoral bifurcations are normally patent. Veins: Venous phase imaging was also performed through the entire abdomen and pelvis demonstrating no evidence of mesenteric venous, portal vein or splenic vein thrombus. The IVC, iliac veins and common femoral veins are normally patent. Bilateral renal veins are widely patent. Review of the MIP images confirms the above findings. NON-VASCULAR Lower chest: Potentially new area sub-solid ground-glass opacity of the right lung which is not completely imaged and lies just superior to the minor fissure at the base  of the right upper lobe and measures on the order of 1.8-1.9 cm. This is not completely evaluated. As this was not present 4 months ago, this may represent an area of atelectasis rather than a new pulmonary lesion. Focal area of pneumonia can not be excluded. Hepatobiliary: No focal liver abnormality is seen. No gallstones, gallbladder wall thickening, or biliary dilatation. Pancreas: Unremarkable. No pancreatic ductal dilatation or surrounding inflammatory changes. Spleen: Normal in size without focal abnormality. Adrenals/Urinary Tract: Adrenal glands are unremarkable. Kidneys are normal, without renal calculi, focal lesion, or hydronephrosis. Bladder is unremarkable. Stomach/Bowel: Previously noted acute colitis involving a long segment of the colon from roughly the mid transverse to rectal levels has now resolved with no significant residual inflammation remaining. There is suggestion, however of potential distal rectal wall thickening. Definition of the rectum is not very clear by CT as it emerges with the vagina anteriorly. Rectal mass is not excluded and correlation is suggested with rectal exam. No evidence of bowel obstruction, ileus or free air. The appendix is unremarkable. Lymphatic: No enlarged lymph nodes identified in the abdomen or pelvis. Reproductive: Status post hysterectomy. No adnexal masses. The residual vaginal cavity appears irregularly thickened towards the pelvic floor. This appears more prominent compared to prior CT studies, including the study in July of 2019. Correlation suggested with gynecologic exam as a vaginal mass cannot be excluded. Other: No hernias are identified. No ascites or focal abscess identified. 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 No fractures or bony lesions identified. IMPRESSION: VASCULAR 1. 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 2. Stable  mild stenosis of the proximal SMA.  3. 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 NON-VASCULAR 1. New area of pulmonary opacity measuring approximately 1.8-1.9 cm and having a ground-glass sub solid appearance in the inferior right upper lobe just above the minor fissure. This was not present by chest CT 4 months ago which would argue for a non neoplastic process such as atelectasis or infection. 2. Essential resolution of previously noted colitis involving a long segment of the colon with potential residual thickening of the rectum. Evaluation of the rectum is limited by CT and correlation is suggested with rectal exam as underlying mass can not be excluded. 3. Irregular thickened appearance of the vaginal cavity which appears more prominent compared to prior CT. Recommend correlation with gynecologic exam as a vaginal mass cannot be excluded. Electronically Signed   By: Aletta Edouard M.D.   On: 01/08/2019 20:53    CLINICAL DATA:  Left shoulder hematoma.  No known injury.  EXAM: CT CHEST WITH CONTRAST  TECHNIQUE: Multidetector CT imaging of the chest was performed during intravenous contrast administration.  CONTRAST:  16m OMNIPAQUE IOHEXOL 300 MG/ML  SOLN  COMPARISON:  CT chest 03/20/2014  FINDINGS: Cardiovascular: Normal heart size. Coronary arterial vascular calcifications. Thoracic aortic vascular calcifications.  Mediastinum/Nodes: No enlarged axillary, mediastinal or hilar lymphadenopathy. Normal appearance of the esophagus.  Lungs/Pleura: Central airways are patent. Dependent atelectasis/scarring within the bilateral lower lobes. 1.7 x 1.2 cm irregular sub solid nodule left lung apex (image 21; series 4), previously measuring 0.7 x 1.0 cm. No large area of pulmonary consolidation. No pleural effusion or pneumothorax.  Upper Abdomen: No acute process.  Musculoskeletal: Thoracic spine degenerative changes. No aggressive or acute appearing osseous  lesions.  IMPRESSION: 1. There is a 1.7 cm enlarging sub solid nodule within the left lung apex. Possibility of slow growing malignancy should be considered. Recommend multi disciplinary thoracic oncology consultation. 2. No acute process within the chest. thank 3. Aortic Atherosclerosis (ICD10-I70.0).   Electronically Signed   By: DLovey NewcomerM.D.   On: 09/01/2018 20:33  Scan from 2015: no mention of left  upper lobe lesion that is present    02/2014:   Recent Lab Findings: Lab Results  Component Value Date   WBC 8.1 09/21/2019   HGB 12.1 09/21/2019   HCT 38.5 09/21/2019   PLT 278 09/21/2019   GLUCOSE 100 (H) 09/21/2019   CHOL 286 (H) 02/15/2019   TRIG 139 02/15/2019   HDL 70 02/15/2019   LDLCALC 188 (H) 02/15/2019   ALT 13 09/21/2019   AST 16 09/21/2019   NA 133 (L) 09/21/2019   K 3.9 09/21/2019   CL 98 09/21/2019   CREATININE 0.79 09/21/2019   BUN 12 09/21/2019   CO2 23 09/21/2019   TSH 8.229 (H) 02/15/2019   INR 0.9 02/14/2019   HGBA1C 5.9 (H) 02/15/2019      Assessment / Plan:   Patient has a persistent parenchymal irregularity left upper lobe, that was present in 2015 and is incidentally noted again in 2019 when she obtain a CT scan for spontaneous bruising along the left neck and chest.  The area in question does appear to have enlarged very slightly over the 4 years.      EGrace IsaacMD      [address] [place] [id number] [phone]  Beeper [phone] 02/13/2020 10:29 AM

## 2020-03-10 DIAGNOSIS — H2513 Age-related nuclear cataract, bilateral: Secondary | ICD-10-CM | POA: Diagnosis not present

## 2020-04-02 ENCOUNTER — Other Ambulatory Visit: Payer: Self-pay

## 2020-04-02 ENCOUNTER — Telehealth (INDEPENDENT_AMBULATORY_CARE_PROVIDER_SITE_OTHER): Payer: Medicare Other | Admitting: Cardiothoracic Surgery

## 2020-04-02 ENCOUNTER — Ambulatory Visit
Admission: RE | Admit: 2020-04-02 | Discharge: 2020-04-02 | Disposition: A | Discharge status: Home / Self Care | Payer: Medicare Other | Source: Ambulatory Visit | Attending: Cardiothoracic Surgery | Admitting: Cardiothoracic Surgery

## 2020-04-02 DIAGNOSIS — R911 Solitary pulmonary nodule: Secondary | ICD-10-CM

## 2020-04-02 NOTE — Progress Notes (Signed)
Brittany Goodman #[id number] Date of Birth: [birth year]  Referring: Brittany Sites, MD Primary Care: Brittany Sites, MD Primary Cardiologist: No primary care provider on file.  Chief Complaint:    Follow-up CT scan of the chest for left upper lobe lung nodule   History of Present Illness:    Brittany Goodman 74 y.o. female had recent pet for evaluation of left upper lobe lung abnormality.   The patient was originally rprecipitated by a visit to the emergency room when she noted extensive bruising along her left neck left anterior chest and down along the left side of her chest.  She denies any specific trauma.  She had been taking high-dose aspirin at.  This was treated without any specific intervention other than discontinuing the aspirin.  Incidentally a CT scan suggested an irregular mass in the left upper lobe.  She had a previous history of a left upper lobe mass that she says was followed for several years with CT scan.  On review of her history and films a CT scan from 2015 was located.  The report on that scan does not mention left upper lobe irregularity but on review of the film there is an area of irregular mass in the area of question that persists now.  It does appear slightly enlarged over 4 years.   Patient is a distant smoker having smoked for approximately 10 years and quitting in 1973.  She denies any history of other malignancy.  Since last seen a PET scan has been done and the patient comes in today with her son to review the results  Current Activity/ Functional Status:  Patient is independent with mobility/ambulation, transfers, ADL's, IADL's.   Zubrod Score: At the time of surgery this patient's most appropriate activity status/level should be described as: []     0    Normal activity, no symptoms [x]     1    Restricted in physical strenuous activity but ambulatory, able to do out light work []     2    Ambulatory and  capable of self care, unable to do work activities, up and about               >50 % of waking hours                              []     3    Only limited self care, in bed greater than 50% of waking hours []     4    Completely disabled, no self care, confined to bed or chair []     5    Moribund   Past Medical History:  Diagnosis Date  . Arthritis   . CVA (cerebral vascular accident) ([place]) 02/14/2019  . Depression   . Depression 02/14/2019  . Diabetes mellitus type 2, uncontrolled, with complications ([place]) 05/18/6380  . Diabetes mellitus type II   . Diverticulosis   . GERD (gastroesophageal reflux disease)   . History of colitis   . Hyperlipidemia   . Hypertension   . Hypothyroidism   . OSA (obstructive sleep apnea) 02/14/2019  . Sepsis ([place])    2011, Escherichia coli pyelonephritis  . Sleep apnea    Does not use CPAP. Cannot tolerate  . Spinal stenosis   . Spinal stenosis 02/14/2019  .  Stroke [facility])    no deficits    Past Surgical History:  Procedure Laterality Date  . BACK SURGERY    . CARPAL TUNNEL RELEASE Right   . COLONOSCOPY N/A 05/29/2013   Focal left colonic inflammation, likely remnant of recent bout of ischemic or segmental colitis, s/p biopsy. pancolonic diverticulosis. Due for surveillance in 5 years  . COLONOSCOPY WITH PROPOFOL N/A 07/26/2018   Procedure: COLONOSCOPY WITH PROPOFOL;  Surgeon: Brittany Dolin, MD;  [facility];  Service: Endoscopy;  Laterality: N/A;  8:30am  . ESOPHAGOGASTRODUODENOSCOPY (EGD) WITH ESOPHAGEAL DILATION N/A 05/29/2013   normal appearing patent tubular esophagus, small hiatal hernia, multiple antral erosions. no ulcer. normlal duodenum. empiric dilation  . ILIOTIBIAL BAND RELEASE Left   . NECK SURGERY    . POLYPECTOMY  07/26/2018   Procedure: POLYPECTOMY;  Surgeon: Brittany Dolin, MD;  Location: [facility];  Service: Endoscopy;;  hepatic flexure polyp  . TOTAL ABDOMINAL HYSTERECTOMY      Family History  Problem Relation Age of  Onset  . Heart disease Mother        MI  . Heart disease Father   . Heart attack Father 32  . Heart disease Brother   . Cancer Brother        ADRENAL GLAND  . Colon polyps Brother   . Liver cancer Brother 13       deceased , ?metastatic cancer?   . Heart disease Maternal Grandfather        MI  . Cancer Paternal Grandfather        LUNG  . Colon cancer Neg Hx      Social History   Tobacco Use  Smoking Status Former Smoker  . Packs/day: 2.00  . Years: 13.00  . Pack years: 26.00  . Quit date: 07/19/1966  . Years since quitting: 53.7  Smokeless Tobacco Former Systems developer  . Types: Chew    Social History   Substance and Sexual Activity  Alcohol Use No     Allergies  Allergen Reactions  . Celecoxib Swelling    Pt states she can tolerate ibuprofen  . Codeine Other (See Comments)    "makes me feel bad"  . Dilaudid [Hydromorphone Hcl] Other (See Comments)    hallucination  . Morphine Nausea And Vomiting  . Statins     Muscle pain  . Tetracycline Nausea And Vomiting    Current Outpatient Medications  Medication Sig Dispense Refill  . acetaminophen (TYLENOL) 500 MG tablet Take 1,000 mg by mouth 2 (two) times daily as needed for mild pain or moderate pain.     . Ascorbic Acid (VITAMIN C) 1000 MG tablet Take 1,000-2,000 mg by mouth daily.     Marland Kitchen atenolol (TENORMIN) 100 MG tablet Take 100 mg by mouth every morning.     . Biotin 5000 MCG TABS Take 1 tablet by mouth daily.    . Cholecalciferol (VITAMIN D) 125 MCG (5000 UT) CAPS Take 1 capsule by mouth daily.     . citalopram (CELEXA) 20 MG tablet Take 20 mg by mouth every other day.     . folic acid (FOLVITE) 259 MCG tablet Take 800 mcg by mouth daily.     Marland Kitchen glimepiride (AMARYL) 1 MG tablet Take 1 mg by mouth daily before breakfast.     . HYDROcodone-acetaminophen (NORCO/VICODIN) 5-325 MG tablet Take 1 tablet by mouth every 4 (four) hours as needed for moderate pain.   0  . levothyroxine (SYNTHROID) 100 MCG  tablet Take 100 mcg by  mouth daily before breakfast.    . lisinopril (PRINIVIL,ZESTRIL) 10 MG tablet Take 1 tablet (10 mg total) by mouth daily for 30 days. 30 tablet 0  . loratadine (CLARITIN) 10 MG tablet Take 10 mg by mouth daily.    . metFORMIN (GLUCOPHAGE) 1000 MG tablet Take 1,000 mg by mouth 2 (two) times daily with a meal.     . methocarbamol (ROBAXIN) 750 MG tablet Take 750 mg by mouth daily. Takes 1/4 tablet only    . NONFORMULARY OR COMPOUNDED ITEM Apply 1 application topically 3 (three) times daily as needed (Pain). Diclofenac, Baclofen, Gabapentin, Lidocaine, Menthol - Compounded at [facility]    . omeprazole (PRILOSEC) 20 MG capsule Take 20 mg by mouth daily.     . polyethylene glycol (MIRALAX / GLYCOLAX) packet Take 17 g by mouth daily as needed for moderate constipation or severe constipation. 30 each 0  . simvastatin (ZOCOR) 10 MG tablet Take 1 tablet (10 mg total) by mouth daily at 6 PM for 30 days. 30 tablet 2  . vitamin B-12 (CYANOCOBALAMIN) 1000 MCG tablet Take 1,000 mcg by mouth daily.     No current facility-administered medications for this visit.    Although listed on the patient's medicine profile she notes that since the bleeding and bruising episode along the left neck and left chest she has not taken any other aspirin.  Pertinent items are noted in HPI.        PHYSICAL EXAMINATION: There were no vitals taken for this visit. No pe  Diagnostic Studies & Laboratory data:     Recent Radiology Findings:  CT Super D Chest Wo Contrast  Result Date: 04/02/2020 CLINICAL DATA:  Lung nodule follow-up. EXAM: CT CHEST WITHOUT CONTRAST TECHNIQUE: Multidetector CT imaging of the chest was performed using thin slice collimation for electromagnetic bronchoscopy planning purposes, without intravenous contrast. COMPARISON:  07/25/2019 FINDINGS: Cardiovascular: Three-vessel coronary artery disease. Aortic annular and mitral annular calcifications. Heart size is normal without pericardial  effusion. Central pulmonary vasculature is of normal caliber. Mediastinum/Nodes: Hilar structures not well assessed due to lack of intravenous contrast. Thoracic inlet structures are unremarkable. No axillary lymphadenopathy. No mediastinal lymphadenopathy. Esophagus grossly normal. Lungs/Pleura: LEFT upper lobe nodule extending to the pleural surface measuring approximately 17 x 9 mm in greatest axial dimension within 1 mm of previous measurement from 2020. The more superior nodular component measures approximately 7 mm as compared to approximately 5 mm on the previous exam. Subpleural reticulation, mild. No consolidation. No pleural effusion. Mild scarring in the lingula no additional suspicious nodules. Airways are patent. Upper Abdomen: Stable low-density lesion in the spleen, nonspecific unchanged since prior PET exam. No acute findings in the upper abdomen. Musculoskeletal: Spinal degenerative changes without acute or destructive bone process. IMPRESSION: 1. LEFT upper lobe nodule extending to the pleural surface measuring approximately 17 x 9 mm in greatest axial dimension within 1 mm of previous measurement from 2020, the more nodular superior projection may be slightly larger as well. Findings are suspicious for primary bronchogenic carcinoma. Given persistence tissue sampling may be helpful. 2. No mediastinal lymphadenopathy. 3. Three-vessel coronary artery disease. Aortic Atherosclerosis (ICD10-I70.0). Electronically Signed   By: Zetta Bills M.D.   On: 04/02/2020 12:19    Nm Pet Image Initial (pi) Skull Base To Thigh  Result Date: 08/02/2019 CLINICAL DATA:  Initial treatment strategy for solitary pulmonary nodule. EXAM: NUCLEAR MEDICINE PET SKULL BASE TO THIGH TECHNIQUE: 8.4 mCi F-18 FDG was injected  intravenously. Full-ring PET imaging was performed from the skull base to thigh after the radiotracer. CT data was obtained and used for attenuation correction and anatomic localization. Fasting blood  glucose: 72 mg/dl COMPARISON:  CT 07/25/2019 FINDINGS: Mediastinal blood pool activity: SUV max 2.1 Liver activity: SUV max NA NECK: No hypermetabolic lymph nodes in the neck. Incidental CT findings: none CHEST: Irregular LEFT apical nodule along the pleural surface measuring 9 mm (image 48/4) corresponds to the nodule of concern on comparison CT . This nodule has low metabolic activity (SUV max equal 1.9). No additional hypermetabolic pulmonary nodules. No hypermetabolic mediastinal lymph nodes. Incidental CT findings: Coronary artery calcification and aortic atherosclerotic calcification. ABDOMEN/PELVIS: No abnormal hypermetabolic activity within the liver, pancreas, adrenal glands, or spleen. No hypermetabolic lymph nodes in the abdomen or pelvis. Diffuse activity within colon and distal small bowel is physiologic. Incidental CT findings: Post hysterectomy Atherosclerotic calcification of the aorta. SKELETON: No focal hypermetabolic activity to suggest skeletal metastasis. Incidental CT findings: none IMPRESSION: 1. Low metabolic activity associated with the LEFT upper lobe pulmonary nodule favors benign etiology. Recommend follow-up CT in 6-12 months. If lesion continues to increase in size, consider tissue sampling. 2.  Aortic Atherosclerosis (ICD10-I70.0). Electronically Signed   By: Suzy Bouchard M.D.   On: 08/02/2019 11:36   Ct Angio Chest Aorta W &/or Wo Contrast  Ct Angio Chest Aorta W &/or Wo Contrast  Result Date: 07/25/2019 CLINICAL DATA:  74 year old female with history of non-small cell lung cancer. Follow-up study. EXAM: CT ANGIOGRAPHY CHEST WITH CONTRAST TECHNIQUE: Multidetector CT imaging of the chest was performed using the standard protocol during bolus administration of intravenous contrast. Multiplanar CT image reconstructions and MIPs were obtained to evaluate the vascular anatomy. CONTRAST:  75m ISOVUE-370 IOPAMIDOL (ISOVUE-370) INJECTION 76% COMPARISON:  Chest CT 01/31/2019.  FINDINGS: Creatinine [facility] [address] Results: Creatinine 0.8 mg/dL. Cardiovascular: Heart size is normal. There is no significant pericardial fluid, thickening or pericardial calcification. There is aortic atherosclerosis, as well as atherosclerosis of the great vessels of the mediastinum and the coronary arteries, including calcified atherosclerotic plaque in the left anterior descending and left circumflex coronary arteries. Mediastinum/Nodes: No pathologically enlarged mediastinal or hilar lymph nodes. Esophagus is unremarkable in appearance. No axillary lymphadenopathy. Lungs/Pleura: The previously noted nodule in the apex of the left upper lobe currently measures 1.9 x 1.0 x 1.5 cm and is a mixture of ground-glass attenuation and solid components, with the largest solid component measuring up to 1.2 cm (axial image 26 of series 7). No other new suspicious appearing pulmonary nodules or masses are noted. No acute consolidative airspace disease. No pleural effusions. Upper Abdomen: Aortic atherosclerosis. Musculoskeletal: There are no aggressive appearing lytic or blastic lesions noted in the visualized portions of the skeleton. Review of the MIP images confirms the above findings. IMPRESSION: 1. Previously noted left upper lobe nodule has slightly increased in size and has a mixed solid and sub solid appearance on today's examination measuring up to 1.9 x 1.0 x 1.5 cm, with a solid component measuring up to 1.2 cm. This is concerning for slow-growing neoplasm such as a primary bronchogenic adenocarcinoma. 2. Aortic atherosclerosis, in addition to 2 vessel coronary artery disease. Assessment for potential risk factor modification, dietary therapy or pharmacologic therapy may be warranted, if clinically indicated. Aortic Atherosclerosis (ICD10-I70.0). Electronically Signed   By: DVinnie LangtonM.D.   On: 07/25/2019 12:00   I have independently reviewed the  above  radiology studies  and reviewed the findings with the patient.  Ct Chest Wo Contrast  Result Date: 01/31/2019 CLINICAL DATA:  Follow-up pulmonary nodule, left upper lobe EXAM: CT CHEST WITHOUT CONTRAST TECHNIQUE: Multidetector CT imaging of the chest was performed following the standard protocol without IV contrast. COMPARISON:  Chest radiograph, 11/28/2018, CT chest, 09/01/2018, 03/20/2014 FINDINGS: Cardiovascular: Coronary artery calcifications and/or stents. Normal heart size. No pericardial effusion. Aortic atherosclerosis. Mediastinum/Nodes: No enlarged mediastinal, hilar, or axillary lymph nodes. Thyroid gland, trachea, and esophagus demonstrate no significant findings. Lungs/Pleura: Stable or minimally decreased size of an irregular nodule of the left pulmonary apex, measuring approximately 1.5 x 1.1 cm (series 8, image 22). New ground-glass opacities of the lateral right upper lobe, likely infectious or inflammatory and measuring up to 2.7 cm (series 8, image 67). No pleural effusion or pneumothorax. Upper Abdomen: No acute abnormality. Musculoskeletal: No chest wall mass or suspicious bone lesions identified. IMPRESSION: 1. Stable or minimally decreased size of an irregular nodule of the left pulmonary apex, measuring approximately 1.5 x 1.1 cm (series 8, image 22). 2. New ground-glass opacities of the lateral right upper lobe, likely infectious or inflammatory and measuring up to 2.7 cm (series 8, image 67). 3.  Coronary artery disease. Electronically Signed   By: Eddie Candle M.D.   On: 01/31/2019 15:23   Ct Angio Abd/pel W/ And/or W/o  Result Date: 01/08/2019 CLINICAL DATA:  Generalized abdominal pain, diarrhea and bloody stools. EXAM: CT ANGIOGRAPHY ABDOMEN AND PELVIS WITH CONTRAST TECHNIQUE: Multidetector CT imaging of the abdomen and pelvis was performed using the standard protocol during bolus administration of intravenous contrast. Multiplanar reconstructed images and MIPs were  obtained and reviewed to evaluate the vascular anatomy. CONTRAST:  168m ISOVUE-370 IOPAMIDOL (ISOVUE-370) INJECTION 76% COMPARISON:  CT of the abdomen and pelvis with contrast on 11/28/2018 and CT of the chest on 09/01/2018. CTA of the abdomen and pelvis on 06/08/2018 FINDINGS: VASCULAR Aorta: Scattered atherosclerotic plaque and tortuosity without evidence of aneurysm or significant stenosis. Celiac: Heavily calcified plaque at the celiac origin likely causes significant stenosis of greater than 70%. This appears stable since July, 2019. The celiac trunk itself remains open with normal patency of distal branch vessels. SMA: Calcified plaque at origin without evidence of significant stenosis. There is some additional noncalcified plaque in the proximal trunk of the superior mesenteric artery that causes approximately 40% non critical narrowing. This appears stable since the prior CTA in July, 2019. Renals: Bilateral single renal arteries present. Calcified and noncalcified plaque in the proximal right renal artery does not cause significant stenosis. 50-60% narrowing at the origin of the left renal artery appears stable. IMA: Normally patent. Inflow: Scattered calcified plaque in bilateral iliac arteries without significant stenosis or evidence of aneurysmal disease. Proximal Outflow: Bilateral common femoral arteries and femoral bifurcations are normally patent. Veins: Venous phase imaging was also performed through the entire abdomen and pelvis demonstrating no evidence of mesenteric venous, portal vein or splenic vein thrombus. The IVC, iliac veins and common femoral veins are normally patent. Bilateral renal veins are widely patent. Review of the MIP images confirms the above findings. NON-VASCULAR Lower chest: Potentially new area sub-solid ground-glass opacity of the right lung which is not completely imaged and lies just superior to the minor fissure at the base of the right upper lobe and measures on the  order of 1.8-1.9 cm. This is not completely evaluated. As this was not present 4 months ago, this may represent an area of atelectasis rather  than a new pulmonary lesion. Focal area of pneumonia can not be excluded. Hepatobiliary: No focal liver abnormality is seen. No gallstones, gallbladder wall thickening, or biliary dilatation. Pancreas: Unremarkable. No pancreatic ductal dilatation or surrounding inflammatory changes. Spleen: Normal in size without focal abnormality. Adrenals/Urinary Tract: Adrenal glands are unremarkable. Kidneys are normal, without renal calculi, focal lesion, or hydronephrosis. Bladder is unremarkable. Stomach/Bowel: Previously noted acute colitis involving a long segment of the colon from roughly the mid transverse to rectal levels has now resolved with no significant residual inflammation remaining. There is suggestion, however of potential distal rectal wall thickening. Definition of the rectum is not very clear by CT as it emerges with the vagina anteriorly. Rectal mass is not excluded and correlation is suggested with rectal exam. No evidence of bowel obstruction, ileus or free air. The appendix is unremarkable. Lymphatic: No enlarged lymph nodes identified in the abdomen or pelvis. Reproductive: Status post hysterectomy. No adnexal masses. The residual vaginal cavity appears irregularly thickened towards the pelvic floor. This appears more prominent compared to prior CT studies, including the study in July of 2019. Correlation suggested with gynecologic exam as a vaginal mass cannot be excluded. Other: No hernias are identified. No ascites or focal abscess identified. 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 No fractures or bony lesions identified. IMPRESSION: VASCULAR 1. 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 2. Stable mild stenosis of the proximal SMA. 3.  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 NON-VASCULAR 1. New area of pulmonary opacity measuring approximately 1.8-1.9 cm and having a ground-glass sub solid appearance in the inferior right upper lobe just above the minor fissure. This was not present by chest CT 4 months ago which would argue for a non neoplastic process such as atelectasis or infection. 2. Essential resolution of previously noted colitis involving a long segment of the colon with potential residual thickening of the rectum. Evaluation of the rectum is limited by CT and correlation is suggested with rectal exam as underlying mass can not be excluded. 3. Irregular thickened appearance of the vaginal cavity which appears more prominent compared to prior CT. Recommend correlation with gynecologic exam as a vaginal mass cannot be excluded. Electronically Signed   By: Aletta Edouard M.D.   On: 01/08/2019 20:53    CLINICAL DATA:  Left shoulder hematoma.  No known injury.  EXAM: CT CHEST WITH CONTRAST  TECHNIQUE: Multidetector CT imaging of the chest was performed during intravenous contrast administration.  CONTRAST:  36m OMNIPAQUE IOHEXOL 300 MG/ML  SOLN  COMPARISON:  CT chest 03/20/2014  FINDINGS: Cardiovascular: Normal heart size. Coronary arterial vascular calcifications. Thoracic aortic vascular calcifications.  Mediastinum/Nodes: No enlarged axillary, mediastinal or hilar lymphadenopathy. Normal appearance of the esophagus.  Lungs/Pleura: Central airways are patent. Dependent atelectasis/scarring within the bilateral lower lobes. 1.7 x 1.2 cm irregular sub solid nodule left lung apex (image 21; series 4), previously measuring 0.7 x 1.0 cm. No large area of pulmonary consolidation. No pleural effusion or pneumothorax.  Upper Abdomen: No acute process.  Musculoskeletal: Thoracic spine degenerative changes. No aggressive or acute appearing osseous lesions.  IMPRESSION: 1. There is  a 1.7 cm enlarging sub solid nodule within the left lung apex. Possibility of slow growing malignancy should be considered. Recommend multi disciplinary thoracic oncology consultation. 2. No acute process within the chest. thank 3. Aortic Atherosclerosis (ICD10-I70.0).   Electronically Signed   By:  Lovey Newcomer M.D.   On: 09/01/2018 20:33  Scan from 2015: no mention of left  upper lobe lesion that is present    02/2014:   Recent Lab Findings: Lab Results  Component Value Date   WBC 8.1 09/21/2019   HGB 12.1 09/21/2019   HCT 38.5 09/21/2019   PLT 278 09/21/2019   GLUCOSE 100 (H) 09/21/2019   CHOL 286 (H) 02/15/2019   TRIG 139 02/15/2019   HDL 70 02/15/2019   LDLCALC 188 (H) 02/15/2019   ALT 13 09/21/2019   AST 16 09/21/2019   NA 133 (L) 09/21/2019   K 3.9 09/21/2019   CL 98 09/21/2019   CREATININE 0.79 09/21/2019   BUN 12 09/21/2019   CO2 23 09/21/2019   TSH 8.229 (H) 02/15/2019   INR 0.9 02/14/2019   HGBA1C 5.9 (H) 02/15/2019      Assessment / Plan:   Patient has a persistent parenchymal irregularity left upper lobe, that was present in 2015 and is incidentally noted again in 2019 when she obtain a CT scan for spontaneous bruising along the left neck and chest.   The area in question does appear to have enlarged very slightly over the 4 years, and is approximately 1 mm larger than it was 6 months ago I  X have explained to the patient that the current findings on the CT scan do not rule out the possibility of a low-grade malignancy in the left upper lobe.  I discussed with her proceeding with navigation bronchoscopy and attempted biopsy at this time.  After discussing the options the patient preferred to wait an additional 6 months before making a decision.  As noted on previous visits her underlying pulmonary disease would preclude resection.  We will plan on a follow-up CT super D format in 6 months per the patient's request, if continued enlargement is noted  consider navigation bronchoscopy with biopsy.  The patient would not be a resection candidate  Grace Isaac MD      [address] [place] [address] Office [phone]   Beeper [phone]  04/02/2020 12:49 PM

## 2020-04-03 DIAGNOSIS — E039 Hypothyroidism, unspecified: Secondary | ICD-10-CM | POA: Diagnosis not present

## 2020-04-03 DIAGNOSIS — H2513 Age-related nuclear cataract, bilateral: Secondary | ICD-10-CM | POA: Diagnosis not present

## 2020-04-03 DIAGNOSIS — K219 Gastro-esophageal reflux disease without esophagitis: Secondary | ICD-10-CM | POA: Diagnosis not present

## 2020-04-03 DIAGNOSIS — Z20822 Contact with and (suspected) exposure to covid-19: Secondary | ICD-10-CM | POA: Diagnosis not present

## 2020-04-03 DIAGNOSIS — E7800 Pure hypercholesterolemia, unspecified: Secondary | ICD-10-CM | POA: Diagnosis not present

## 2020-04-03 DIAGNOSIS — E1136 Type 2 diabetes mellitus with diabetic cataract: Secondary | ICD-10-CM | POA: Diagnosis not present

## 2020-04-03 DIAGNOSIS — Z885 Allergy status to narcotic agent status: Secondary | ICD-10-CM | POA: Diagnosis not present

## 2020-04-03 DIAGNOSIS — Z79899 Other long term (current) drug therapy: Secondary | ICD-10-CM | POA: Diagnosis not present

## 2020-04-03 DIAGNOSIS — M1990 Unspecified osteoarthritis, unspecified site: Secondary | ICD-10-CM | POA: Diagnosis not present

## 2020-04-03 DIAGNOSIS — Z87891 Personal history of nicotine dependence: Secondary | ICD-10-CM | POA: Diagnosis not present

## 2020-04-03 DIAGNOSIS — Z7984 Long term (current) use of oral hypoglycemic drugs: Secondary | ICD-10-CM | POA: Diagnosis not present

## 2020-04-03 DIAGNOSIS — Z881 Allergy status to other antibiotic agents status: Secondary | ICD-10-CM | POA: Diagnosis not present

## 2020-04-03 DIAGNOSIS — Z8673 Personal history of transient ischemic attack (TIA), and cerebral infarction without residual deficits: Secondary | ICD-10-CM | POA: Diagnosis not present

## 2020-04-03 DIAGNOSIS — I10 Essential (primary) hypertension: Secondary | ICD-10-CM | POA: Diagnosis not present

## 2020-04-06 DIAGNOSIS — H2513 Age-related nuclear cataract, bilateral: Secondary | ICD-10-CM | POA: Diagnosis not present

## 2020-04-06 DIAGNOSIS — E1136 Type 2 diabetes mellitus with diabetic cataract: Secondary | ICD-10-CM | POA: Diagnosis not present

## 2020-04-06 DIAGNOSIS — E039 Hypothyroidism, unspecified: Secondary | ICD-10-CM | POA: Diagnosis not present

## 2020-04-06 DIAGNOSIS — H2512 Age-related nuclear cataract, left eye: Secondary | ICD-10-CM | POA: Diagnosis not present

## 2020-04-06 DIAGNOSIS — I10 Essential (primary) hypertension: Secondary | ICD-10-CM | POA: Diagnosis not present

## 2020-04-06 DIAGNOSIS — H259 Unspecified age-related cataract: Secondary | ICD-10-CM | POA: Diagnosis not present

## 2020-04-06 DIAGNOSIS — E7800 Pure hypercholesterolemia, unspecified: Secondary | ICD-10-CM | POA: Diagnosis not present

## 2020-04-06 DIAGNOSIS — K219 Gastro-esophageal reflux disease without esophagitis: Secondary | ICD-10-CM | POA: Diagnosis not present

## 2020-06-11 DIAGNOSIS — H2511 Age-related nuclear cataract, right eye: Secondary | ICD-10-CM | POA: Diagnosis not present

## 2020-06-11 DIAGNOSIS — H25011 Cortical age-related cataract, right eye: Secondary | ICD-10-CM | POA: Diagnosis not present

## 2020-06-25 DIAGNOSIS — Z961 Presence of intraocular lens: Secondary | ICD-10-CM | POA: Diagnosis not present

## 2020-07-14 DIAGNOSIS — Z961 Presence of intraocular lens: Secondary | ICD-10-CM | POA: Diagnosis not present

## 2020-07-14 DIAGNOSIS — H04121 Dry eye syndrome of right lacrimal gland: Secondary | ICD-10-CM | POA: Diagnosis not present

## 2020-08-17 ENCOUNTER — Other Ambulatory Visit (HOSPITAL_COMMUNITY): Payer: Self-pay | Admitting: Family Medicine

## 2020-08-17 DIAGNOSIS — I639 Cerebral infarction, unspecified: Secondary | ICD-10-CM | POA: Diagnosis not present

## 2020-08-17 DIAGNOSIS — E538 Deficiency of other specified B group vitamins: Secondary | ICD-10-CM | POA: Diagnosis not present

## 2020-08-17 DIAGNOSIS — E7849 Other hyperlipidemia: Secondary | ICD-10-CM | POA: Diagnosis not present

## 2020-08-17 DIAGNOSIS — E039 Hypothyroidism, unspecified: Secondary | ICD-10-CM | POA: Diagnosis not present

## 2020-08-17 DIAGNOSIS — Z0001 Encounter for general adult medical examination with abnormal findings: Secondary | ICD-10-CM | POA: Diagnosis not present

## 2020-08-17 DIAGNOSIS — E119 Type 2 diabetes mellitus without complications: Secondary | ICD-10-CM | POA: Diagnosis not present

## 2020-08-17 DIAGNOSIS — Z6829 Body mass index (BMI) 29.0-29.9, adult: Secondary | ICD-10-CM | POA: Diagnosis not present

## 2020-08-17 DIAGNOSIS — Z1231 Encounter for screening mammogram for malignant neoplasm of breast: Secondary | ICD-10-CM

## 2020-08-17 DIAGNOSIS — E559 Vitamin D deficiency, unspecified: Secondary | ICD-10-CM | POA: Diagnosis not present

## 2020-08-17 DIAGNOSIS — E118 Type 2 diabetes mellitus with unspecified complications: Secondary | ICD-10-CM | POA: Diagnosis not present

## 2020-08-17 DIAGNOSIS — G2581 Restless legs syndrome: Secondary | ICD-10-CM | POA: Diagnosis not present

## 2020-08-17 DIAGNOSIS — D491 Neoplasm of unspecified behavior of respiratory system: Secondary | ICD-10-CM | POA: Diagnosis not present

## 2020-08-17 DIAGNOSIS — Z1389 Encounter for screening for other disorder: Secondary | ICD-10-CM | POA: Diagnosis not present

## 2020-08-26 DIAGNOSIS — M4180 Other forms of scoliosis, site unspecified: Secondary | ICD-10-CM | POA: Diagnosis not present

## 2020-08-26 DIAGNOSIS — M48061 Spinal stenosis, lumbar region without neurogenic claudication: Secondary | ICD-10-CM | POA: Diagnosis not present

## 2020-08-26 DIAGNOSIS — M5136 Other intervertebral disc degeneration, lumbar region: Secondary | ICD-10-CM | POA: Diagnosis not present

## 2020-09-03 ENCOUNTER — Other Ambulatory Visit: Payer: Self-pay | Admitting: *Deleted

## 2020-09-03 DIAGNOSIS — R911 Solitary pulmonary nodule: Secondary | ICD-10-CM

## 2020-09-29 DIAGNOSIS — L570 Actinic keratosis: Secondary | ICD-10-CM | POA: Diagnosis not present

## 2020-09-29 DIAGNOSIS — D045 Carcinoma in situ of skin of trunk: Secondary | ICD-10-CM | POA: Diagnosis not present

## 2020-09-29 DIAGNOSIS — C44629 Squamous cell carcinoma of skin of left upper limb, including shoulder: Secondary | ICD-10-CM | POA: Diagnosis not present

## 2020-10-08 ENCOUNTER — Other Ambulatory Visit: Payer: Medicare Other

## 2020-10-08 ENCOUNTER — Ambulatory Visit: Payer: Medicare Other | Admitting: Cardiothoracic Surgery

## 2020-10-29 ENCOUNTER — Encounter: Payer: Self-pay | Admitting: Cardiothoracic Surgery

## 2020-10-29 ENCOUNTER — Other Ambulatory Visit: Payer: Self-pay

## 2020-10-29 ENCOUNTER — Ambulatory Visit (INDEPENDENT_AMBULATORY_CARE_PROVIDER_SITE_OTHER): Payer: Medicare Other | Admitting: Cardiothoracic Surgery

## 2020-10-29 ENCOUNTER — Ambulatory Visit
Admission: RE | Admit: 2020-10-29 | Discharge: 2020-10-29 | Disposition: A | Discharge status: Home / Self Care | Payer: Medicare Other | Source: Ambulatory Visit | Attending: Cardiothoracic Surgery | Admitting: Cardiothoracic Surgery

## 2020-10-29 ENCOUNTER — Other Ambulatory Visit: Payer: Medicare Other

## 2020-10-29 VITALS — BP 151/85 | HR 66 | Resp 18 | Wt 170.8 lb

## 2020-10-29 DIAGNOSIS — I2510 Atherosclerotic heart disease of native coronary artery without angina pectoris: Secondary | ICD-10-CM | POA: Diagnosis not present

## 2020-10-29 DIAGNOSIS — R911 Solitary pulmonary nodule: Secondary | ICD-10-CM

## 2020-10-29 DIAGNOSIS — J439 Emphysema, unspecified: Secondary | ICD-10-CM | POA: Diagnosis not present

## 2020-10-29 DIAGNOSIS — Z01818 Encounter for other preprocedural examination: Secondary | ICD-10-CM | POA: Diagnosis not present

## 2020-10-29 DIAGNOSIS — J984 Other disorders of lung: Secondary | ICD-10-CM | POA: Diagnosis not present

## 2020-10-29 NOTE — Progress Notes (Signed)
[place] Record #[id number] Date of Birth: [birth year]  Referring: [name], MD Primary Care: [name], MD Primary Cardiologist: No primary care provider on file.  Chief Complaint:    Follow-up CT scan of the chest for left upper lobe lung nodule   History of Present Illness:    Brittany Goodman 74 y.o. female returns today with a follow-up CT scan of the chest.  She has been followed for a abnormality left upper lobe that has been present at least since a CT scan of the chest in 2015.   The patient original visit to the surgical office was precipitated by a visit to the emergency room when she noted spontaneous  extensive bruising along her left neck left anterior chest and down along the left side of her chest.  She denied any specific trauma.  She had been taking high-dose aspirin at.  This was treated without any specific intervention other than discontinuing the aspirin.  Incidentally a CT scan suggested an irregular mass in the left upper lobe.  She had a previous history of a left upper lobe mass that she says was followed for several years with CT scan.  On review of her history and films a CT scan from 2015 was located.  The report on that scan does not mention left upper lobe irregularity but on review of the film there is an area of irregular mass in the area of question that persists now.     Patient is a distant smoker having smoked for approximately 10 years and quitting in 1973 .  She denies any history of other malignancy.  She has been followed with serial CT scans and a PET scan September 2020  She is mobile and cares for herself, she does note she has difficulty with shortness of breath with exertion especially climbing stairs  Current Activity/ Functional Status:  Patient is independent with mobility/ambulation, transfers, ADL's, IADL's.   Zubrod Score: At the time of surgery this patient's most appropriate  activity status/level should be described as: []     0    Normal activity, no symptoms [x]     1    Restricted in physical strenuous activity but ambulatory, able to do out light work []     2    Ambulatory and capable of self care, unable to do work activities, up and about               >50 % of waking hours                              []     3    Only limited self care, in bed greater than 50% of waking hours []     4    Completely disabled, no self care, confined to bed or chair []     5    Moribund   Past Medical History:  Diagnosis Date  . Arthritis   . CVA (cerebral vascular accident) ([place]) 02/14/2019  . Depression   . Depression 02/14/2019  . Diabetes mellitus type 2, uncontrolled, with complications ([place]) 6/59/9357  . Diabetes mellitus type II   . Diverticulosis   . GERD (gastroesophageal reflux disease)   . History of colitis   . Hyperlipidemia   . Hypertension   . Hypothyroidism   .  OSA (obstructive sleep apnea) 02/14/2019  . Sepsis ([place])    2011, Escherichia coli pyelonephritis  . Sleep apnea    Does not use CPAP. Cannot tolerate  . Spinal stenosis   . Spinal stenosis 02/14/2019  . Stroke [facility])    no deficits    Past Surgical History:  Procedure Laterality Date  . BACK SURGERY    . CARPAL TUNNEL RELEASE Right   . COLONOSCOPY N/A 05/29/2013   Focal left colonic inflammation, likely remnant of recent bout of ischemic or segmental colitis, s/p biopsy. pancolonic diverticulosis. Due for surveillance in 5 years  . COLONOSCOPY WITH PROPOFOL N/A 07/26/2018   Procedure: COLONOSCOPY WITH PROPOFOL;  Surgeon: Daneil Dolin, MD;  [facility];  Service: Endoscopy;  Laterality: N/A;  8:30am  . ESOPHAGOGASTRODUODENOSCOPY (EGD) WITH ESOPHAGEAL DILATION N/A 05/29/2013   normal appearing patent tubular esophagus, small hiatal hernia, multiple antral erosions. no ulcer. normlal duodenum. empiric dilation  . ILIOTIBIAL BAND RELEASE Left   . NECK SURGERY    . POLYPECTOMY  07/26/2018    Procedure: POLYPECTOMY;  Surgeon: Daneil Dolin, MD;  Location: [facility];  Service: Endoscopy;;  hepatic flexure polyp  . TOTAL ABDOMINAL HYSTERECTOMY      Family History  Problem Relation Age of Onset  . Heart disease Mother        MI  . Heart disease Father   . Heart attack Father 86  . Heart disease Brother   . Cancer Brother        ADRENAL GLAND  . Colon polyps Brother   . Liver cancer Brother 85       deceased , ?metastatic cancer?   . Heart disease Maternal Grandfather        MI  . Cancer Paternal Grandfather        LUNG  . Colon cancer Neg Hx      Social History   Tobacco Use  Smoking Status Former Smoker  . Packs/day: 2.00  . Years: 13.00  . Pack years: 26.00  . Quit date: 07/19/1966  . Years since quitting: 54.3  Smokeless Tobacco Former Systems developer  . Types: Chew    Social History   Substance and Sexual Activity  Alcohol Use No     Allergies  Allergen Reactions  . Celecoxib Swelling    Pt states she can tolerate ibuprofen  . Codeine Other (See Comments)    "makes me feel bad"  . Dilaudid [Hydromorphone Hcl] Other (See Comments)    hallucination  . Morphine Nausea And Vomiting  . Statins     Muscle pain  . Tetracycline Nausea And Vomiting    Current Outpatient Medications  Medication Sig Dispense Refill  . acetaminophen (TYLENOL) 500 MG tablet Take 1,000 mg by mouth 2 (two) times daily as needed for mild pain or moderate pain.     . Ascorbic Acid (VITAMIN C) 1000 MG tablet Take 1,000-2,000 mg by mouth daily.     Marland Kitchen atenolol (TENORMIN) 100 MG tablet Take 100 mg by mouth every morning.    . Biotin 5000 MCG TABS Take 1 tablet by mouth daily.    . Cholecalciferol (VITAMIN D) 125 MCG (5000 UT) CAPS Take 1 capsule by mouth daily.     . citalopram (CELEXA) 20 MG tablet Take 20 mg by mouth every other day.    . folic acid (FOLVITE) 389 MCG tablet Take 800 mcg by mouth daily.     Marland Kitchen glimepiride (AMARYL) 1 MG tablet Take 1  mg by mouth daily before  breakfast.    . HYDROcodone-acetaminophen (NORCO/VICODIN) 5-325 MG tablet Take 1 tablet by mouth every 4 (four) hours as needed for moderate pain.   0  . levothyroxine (SYNTHROID) 100 MCG tablet Take 88 mcg by mouth daily before breakfast.    . loratadine (CLARITIN) 10 MG tablet Take 10 mg by mouth daily.    . metFORMIN (GLUCOPHAGE) 1000 MG tablet Take 1,000 mg by mouth 2 (two) times daily with a meal.     . NONFORMULARY OR COMPOUNDED ITEM Apply 1 application topically 3 (three) times daily as needed (Pain). Diclofenac, Baclofen, Gabapentin, Lidocaine, Menthol - Compounded at [facility]    . omeprazole (PRILOSEC) 20 MG capsule Take 20 mg by mouth daily.    . polyethylene glycol (MIRALAX / GLYCOLAX) packet Take 17 g by mouth daily as needed for moderate constipation or severe constipation. 30 each 0  . vitamin B-12 (CYANOCOBALAMIN) 1000 MCG tablet Take 1,000 mcg by mouth daily.    . cyclobenzaprine (FLEXERIL) 10 MG tablet Take 10 mg by mouth 3 (three) times daily as needed for muscle spasms.    Marland Kitchen lisinopril (PRINIVIL,ZESTRIL) 10 MG tablet Take 1 tablet (10 mg total) by mouth daily for 30 days. 30 tablet 0  . simvastatin (ZOCOR) 10 MG tablet Take 1 tablet (10 mg total) by mouth daily at 6 PM for 30 days. 30 tablet 2   No current facility-administered medications for this visit.    Although listed on the patient's medicine profile she notes that since the bleeding and bruising episode along the left neck and left chest she has not taken any other aspirin.  Pertinent items are noted in HPI.     PHYSICAL EXAMINATION: BP (!) 151/85 (BP Location: Left Arm, Patient Position: Sitting)   Pulse 66   Resp 18   Wt 170 lb 12.8 oz (77.5 kg)   SpO2 95% Comment: RA with mask on  BMI 28.42 kg/m  General appearance: alert, cooperative and no distress Head: Normocephalic, without obvious abnormality, atraumatic Neck: no adenopathy, no carotid bruit, no JVD, supple, symmetrical, trachea midline  and thyroid not enlarged, symmetric, no tenderness/mass/nodules Lymph nodes: Cervical, supraclavicular, and axillary nodes normal. Resp: clear to auscultation bilaterally Cardio: regular rate and rhythm, S1, S2 normal, no murmur, click, rub or gallop GI: soft, non-tender; bowel sounds normal; no masses,  no organomegaly Extremities: extremities normal, atraumatic, no cyanosis or edema and Homans sign is negative, no sign of DVT Neurologic: Grossly normal   Diagnostic Studies & Laboratory data:     Recent Radiology Findings: CT Super D Chest Wo Contrast  Result Date: 10/29/2020 CLINICAL DATA:  Preop for left upper lobe EMB lung biopsy. EXAM: CT CHEST WITHOUT CONTRAST TECHNIQUE: Multidetector CT imaging of the chest was performed using thin slice collimation for electromagnetic bronchoscopy planning purposes, without intravenous contrast. COMPARISON:  CT chest 04/02/2020 FINDINGS: Cardiovascular: The heart is normal in size. No pericardial effusion. Stable tortuosity, ectasia and calcification of the thoracic aorta but no focal aneurysm. Stable three-vessel coronary artery calcifications. Mediastinum/Nodes: No mediastinal or hilar mass or lymphadenopathy. The esophagus is grossly normal. Lungs/Pleura: Linear left apical pulmonary lesion is stable. It measures approximately 18 x 8.5 mm (prior 17 x 9 mm). I do not see progressive changes since the prior study. Stable underlying emphysematous changes. No new pulmonary lesions. No acute overlying pulmonary process. No pleural effusions or pleural lesions. Stable lingular and right middle lobe scarring changes. Upper Abdomen: No significant upper abdominal  findings. Musculoskeletal: No breast masses, supraclavicular or axillary adenopathy. The bony thorax is intact. IMPRESSION: 1. Stable left apical pulmonary lesion. 2. No mediastinal or hilar mass or adenopathy. 3. Stable emphysematous changes and pulmonary scarring. 4. Stable three-vessel coronary artery  calcifications. 5. Emphysema and aortic atherosclerosis. Aortic Atherosclerosis (ICD10-I70.0) and Emphysema (ICD10-J43.9). Electronically Signed   By: Marijo Sanes M.D.   On: 10/29/2020 13:02  I have independently reviewed the above radiology studies  and reviewed the findings with the patient.   CT Super D Chest Wo Contrast  Result Date: 10/29/2020 CLINICAL DATA:  Preop for left upper lobe EMB lung biopsy. EXAM: CT CHEST WITHOUT CONTRAST TECHNIQUE: Multidetector CT imaging of the chest was performed using thin slice collimation for electromagnetic bronchoscopy planning purposes, without intravenous contrast. COMPARISON:  CT chest 04/02/2020 FINDINGS: Cardiovascular: The heart is normal in size. No pericardial effusion. Stable tortuosity, ectasia and calcification of the thoracic aorta but no focal aneurysm. Stable three-vessel coronary artery calcifications. Mediastinum/Nodes: No mediastinal or hilar mass or lymphadenopathy. The esophagus is grossly normal. Lungs/Pleura: Linear left apical pulmonary lesion is stable. It measures approximately 18 x 8.5 mm (prior 17 x 9 mm). I do not see progressive changes since the prior study. Stable underlying emphysematous changes. No new pulmonary lesions. No acute overlying pulmonary process. No pleural effusions or pleural lesions. Stable lingular and right middle lobe scarring changes. Upper Abdomen: No significant upper abdominal findings. Musculoskeletal: No breast masses, supraclavicular or axillary adenopathy. The bony thorax is intact. IMPRESSION: 1. Stable left apical pulmonary lesion. 2. No mediastinal or hilar mass or adenopathy. 3. Stable emphysematous changes and pulmonary scarring. 4. Stable three-vessel coronary artery calcifications. 5. Emphysema and aortic atherosclerosis. Aortic Atherosclerosis (ICD10-I70.0) and Emphysema (ICD10-J43.9). Electronically Signed   By: Marijo Sanes M.D.   On: 10/29/2020 13:02    CT Super D Chest Wo Contrast  Result  Date: 04/02/2020 CLINICAL DATA:  Lung nodule follow-up. EXAM: CT CHEST WITHOUT CONTRAST TECHNIQUE: Multidetector CT imaging of the chest was performed using thin slice collimation for electromagnetic bronchoscopy planning purposes, without intravenous contrast. COMPARISON:  07/25/2019 FINDINGS: Cardiovascular: Three-vessel coronary artery disease. Aortic annular and mitral annular calcifications. Heart size is normal without pericardial effusion. Central pulmonary vasculature is of normal caliber. Mediastinum/Nodes: Hilar structures not well assessed due to lack of intravenous contrast. Thoracic inlet structures are unremarkable. No axillary lymphadenopathy. No mediastinal lymphadenopathy. Esophagus grossly normal. Lungs/Pleura: LEFT upper lobe nodule extending to the pleural surface measuring approximately 17 x 9 mm in greatest axial dimension within 1 mm of previous measurement from 2020. The more superior nodular component measures approximately 7 mm as compared to approximately 5 mm on the previous exam. Subpleural reticulation, mild. No consolidation. No pleural effusion. Mild scarring in the lingula no additional suspicious nodules. Airways are patent. Upper Abdomen: Stable low-density lesion in the spleen, nonspecific unchanged since prior PET exam. No acute findings in the upper abdomen. Musculoskeletal: Spinal degenerative changes without acute or destructive bone process. IMPRESSION: 1. LEFT upper lobe nodule extending to the pleural surface measuring approximately 17 x 9 mm in greatest axial dimension within 1 mm of previous measurement from 2020, the more nodular superior projection may be slightly larger as well. Findings are suspicious for primary bronchogenic carcinoma. Given persistence tissue sampling may be helpful. 2. No mediastinal lymphadenopathy. 3. Three-vessel coronary artery disease. Aortic Atherosclerosis (ICD10-I70.0). Electronically Signed   By: Zetta Bills M.D.   On: 04/02/2020 12:19     Nm Pet Image Initial (pi) Skull  Base To Thigh  Result Date: 08/02/2019 CLINICAL DATA:  Initial treatment strategy for solitary pulmonary nodule. EXAM: NUCLEAR MEDICINE PET SKULL BASE TO THIGH TECHNIQUE: 8.4 mCi F-18 FDG was injected intravenously. Full-ring PET imaging was performed from the skull base to thigh after the radiotracer. CT data was obtained and used for attenuation correction and anatomic localization. Fasting blood glucose: 72 mg/dl COMPARISON:  CT 07/25/2019 FINDINGS: Mediastinal blood pool activity: SUV max 2.1 Liver activity: SUV max NA NECK: No hypermetabolic lymph nodes in the neck. Incidental CT findings: none CHEST: Irregular LEFT apical nodule along the pleural surface measuring 9 mm (image 48/4) corresponds to the nodule of concern on comparison CT . This nodule has low metabolic activity (SUV max equal 1.9). No additional hypermetabolic pulmonary nodules. No hypermetabolic mediastinal lymph nodes. Incidental CT findings: Coronary artery calcification and aortic atherosclerotic calcification. ABDOMEN/PELVIS: No abnormal hypermetabolic activity within the liver, pancreas, adrenal glands, or spleen. No hypermetabolic lymph nodes in the abdomen or pelvis. Diffuse activity within colon and distal small bowel is physiologic. Incidental CT findings: Post hysterectomy Atherosclerotic calcification of the aorta. SKELETON: No focal hypermetabolic activity to suggest skeletal metastasis. Incidental CT findings: none IMPRESSION: 1. Low metabolic activity associated with the LEFT upper lobe pulmonary nodule favors benign etiology. Recommend follow-up CT in 6-12 months. If lesion continues to increase in size, consider tissue sampling. 2.  Aortic Atherosclerosis (ICD10-I70.0). Electronically Signed   By: Suzy Bouchard M.D.   On: 08/02/2019 11:36   Ct Angio Chest Aorta W &/or Wo Contrast  Ct Angio Chest Aorta W &/or Wo Contrast  Result Date: 07/25/2019 CLINICAL DATA:  74 year old female  with history of non-small cell lung cancer. Follow-up study. EXAM: CT ANGIOGRAPHY CHEST WITH CONTRAST TECHNIQUE: Multidetector CT imaging of the chest was performed using the standard protocol during bolus administration of intravenous contrast. Multiplanar CT image reconstructions and MIPs were obtained to evaluate the vascular anatomy. CONTRAST:  66m ISOVUE-370 IOPAMIDOL (ISOVUE-370) INJECTION 76% COMPARISON:  Chest CT 01/31/2019. FINDINGS: [facility] [address] Results: Creatinine 0.8 mg/dL. Cardiovascular: Heart size is normal. There is no significant pericardial fluid, thickening or pericardial calcification. There is aortic atherosclerosis, as well as atherosclerosis of the great vessels of the mediastinum and the coronary arteries, including calcified atherosclerotic plaque in the left anterior descending and left circumflex coronary arteries. Mediastinum/Nodes: No pathologically enlarged mediastinal or hilar lymph nodes. Esophagus is unremarkable in appearance. No axillary lymphadenopathy. Lungs/Pleura: The previously noted nodule in the apex of the left upper lobe currently measures 1.9 x 1.0 x 1.5 cm and is a mixture of ground-glass attenuation and solid components, with the largest solid component measuring up to 1.2 cm (axial image 26 of series 7). No other new suspicious appearing pulmonary nodules or masses are noted. No acute consolidative airspace disease. No pleural effusions. Upper Abdomen: Aortic atherosclerosis. Musculoskeletal: There are no aggressive appearing lytic or blastic lesions noted in the visualized portions of the skeleton. Review of the MIP images confirms the above findings. IMPRESSION: 1. Previously noted left upper lobe nodule has slightly increased in size and has a mixed solid and sub solid appearance on today's examination measuring up to 1.9 x 1.0 x 1.5 cm, with a solid component measuring up to 1.2 cm. This is  concerning for slow-growing neoplasm such as a primary bronchogenic adenocarcinoma. 2. Aortic atherosclerosis, in addition to 2 vessel coronary artery disease. Assessment for potential risk factor modification, dietary therapy or pharmacologic therapy  may be warranted, if clinically indicated. Aortic Atherosclerosis (ICD10-I70.0). Electronically Signed   By: Vinnie Langton M.D.   On: 07/25/2019 12:00   I have independently reviewed the above radiology studies  and reviewed the findings with the patient.  Ct Chest Wo Contrast  Result Date: 01/31/2019 CLINICAL DATA:  Follow-up pulmonary nodule, left upper lobe EXAM: CT CHEST WITHOUT CONTRAST TECHNIQUE: Multidetector CT imaging of the chest was performed following the standard protocol without IV contrast. COMPARISON:  Chest radiograph, 11/28/2018, CT chest, 09/01/2018, 03/20/2014 FINDINGS: Cardiovascular: Coronary artery calcifications and/or stents. Normal heart size. No pericardial effusion. Aortic atherosclerosis. Mediastinum/Nodes: No enlarged mediastinal, hilar, or axillary lymph nodes. Thyroid gland, trachea, and esophagus demonstrate no significant findings. Lungs/Pleura: Stable or minimally decreased size of an irregular nodule of the left pulmonary apex, measuring approximately 1.5 x 1.1 cm (series 8, image 22). New ground-glass opacities of the lateral right upper lobe, likely infectious or inflammatory and measuring up to 2.7 cm (series 8, image 67). No pleural effusion or pneumothorax. Upper Abdomen: No acute abnormality. Musculoskeletal: No chest wall mass or suspicious bone lesions identified. IMPRESSION: 1. Stable or minimally decreased size of an irregular nodule of the left pulmonary apex, measuring approximately 1.5 x 1.1 cm (series 8, image 22). 2. New ground-glass opacities of the lateral right upper lobe, likely infectious or inflammatory and measuring up to 2.7 cm (series 8, image 67). 3.  Coronary artery disease. Electronically Signed    By: Eddie Candle M.D.   On: 01/31/2019 15:23   Ct Angio Abd/pel W/ And/or W/o  Result Date: 01/08/2019 CLINICAL DATA:  Generalized abdominal pain, diarrhea and bloody stools. EXAM: CT ANGIOGRAPHY ABDOMEN AND PELVIS WITH CONTRAST TECHNIQUE: Multidetector CT imaging of the abdomen and pelvis was performed using the standard protocol during bolus administration of intravenous contrast. Multiplanar reconstructed images and MIPs were obtained and reviewed to evaluate the vascular anatomy. CONTRAST:  190m ISOVUE-370 IOPAMIDOL (ISOVUE-370) INJECTION 76% COMPARISON:  CT of the abdomen and pelvis with contrast on 11/28/2018 and CT of the chest on 09/01/2018. CTA of the abdomen and pelvis on 06/08/2018 FINDINGS: VASCULAR Aorta: Scattered atherosclerotic plaque and tortuosity without evidence of aneurysm or significant stenosis. Celiac: Heavily calcified plaque at the celiac origin likely causes significant stenosis of greater than 70%. This appears stable since July, 2019. The celiac trunk itself remains open with normal patency of distal branch vessels. SMA: Calcified plaque at origin without evidence of significant stenosis. There is some additional noncalcified plaque in the proximal trunk of the superior mesenteric artery that causes approximately 40% non critical narrowing. This appears stable since the prior CTA in July, 2019. Renals: Bilateral single renal arteries present. Calcified and noncalcified plaque in the proximal right renal artery does not cause significant stenosis. 50-60% narrowing at the origin of the left renal artery appears stable. IMA: Normally patent. Inflow: Scattered calcified plaque in bilateral iliac arteries without significant stenosis or evidence of aneurysmal disease. Proximal Outflow: Bilateral common femoral arteries and femoral bifurcations are normally patent. Veins: Venous phase imaging was also performed through the entire abdomen and pelvis demonstrating no evidence of mesenteric  venous, portal vein or splenic vein thrombus. The IVC, iliac veins and common femoral veins are normally patent. Bilateral renal veins are widely patent. Review of the MIP images confirms the above findings. NON-VASCULAR Lower chest: Potentially new area sub-solid ground-glass opacity of the right lung which is not completely imaged and lies just superior to the minor fissure at the base of the right  upper lobe and measures on the order of 1.8-1.9 cm. This is not completely evaluated. As this was not present 4 months ago, this may represent an area of atelectasis rather than a new pulmonary lesion. Focal area of pneumonia can not be excluded. Hepatobiliary: No focal liver abnormality is seen. No gallstones, gallbladder wall thickening, or biliary dilatation. Pancreas: Unremarkable. No pancreatic ductal dilatation or surrounding inflammatory changes. Spleen: Normal in size without focal abnormality. Adrenals/Urinary Tract: Adrenal glands are unremarkable. Kidneys are normal, without renal calculi, focal lesion, or hydronephrosis. Bladder is unremarkable. Stomach/Bowel: Previously noted acute colitis involving a long segment of the colon from roughly the mid transverse to rectal levels has now resolved with no significant residual inflammation remaining. There is suggestion, however of potential distal rectal wall thickening. Definition of the rectum is not very clear by CT as it emerges with the vagina anteriorly. Rectal mass is not excluded and correlation is suggested with rectal exam. No evidence of bowel obstruction, ileus or free air. The appendix is unremarkable. Lymphatic: No enlarged lymph nodes identified in the abdomen or pelvis. Reproductive: Status post hysterectomy. No adnexal masses. The residual vaginal cavity appears irregularly thickened towards the pelvic floor. This appears more prominent compared to prior CT studies, including the study in July of 2019. Correlation suggested with gynecologic exam  as a vaginal mass cannot be excluded. Other: No hernias are identified. No ascites or focal abscess identified. 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 No fractures or bony lesions identified. IMPRESSION: VASCULAR 1. 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 2. Stable mild stenosis of the proximal SMA. 3. 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 NON-VASCULAR 1. New area of pulmonary opacity measuring approximately 1.8-1.9 cm and having a ground-glass sub solid appearance in the inferior right upper lobe just above the minor fissure. This was not present by chest CT 4 months ago which would argue for a non neoplastic process such as atelectasis or infection. 2. Essential resolution of previously noted colitis involving a long segment of the colon with potential residual thickening of the rectum. Evaluation of the rectum is limited by CT and correlation is suggested with rectal exam as underlying mass can not be excluded. 3. Irregular thickened appearance of the vaginal cavity which appears more prominent compared to prior CT. Recommend correlation with gynecologic exam as a vaginal mass cannot be excluded. Electronically Signed   By: Aletta Edouard M.D.   On: 01/08/2019 20:53    CLINICAL DATA:  Left shoulder hematoma.  No known injury.  EXAM: CT CHEST WITH CONTRAST  TECHNIQUE: Multidetector CT imaging of the chest was performed during intravenous contrast administration.  CONTRAST:  12m OMNIPAQUE IOHEXOL 300 MG/ML  SOLN  COMPARISON:  CT chest 03/20/2014  FINDINGS: Cardiovascular: Normal heart size. Coronary arterial vascular calcifications. Thoracic aortic vascular calcifications.  Mediastinum/Nodes: No enlarged axillary, mediastinal or hilar lymphadenopathy. Normal appearance of the esophagus.  Lungs/Pleura: Central airways are patent.  Dependent atelectasis/scarring within the bilateral lower lobes. 1.7 x 1.2 cm irregular sub solid nodule left lung apex (image 21; series 4), previously measuring 0.7 x 1.0 cm. No large area of pulmonary consolidation. No pleural effusion or pneumothorax.  Upper Abdomen: No acute process.  Musculoskeletal: Thoracic spine degenerative changes. No aggressive or acute appearing osseous lesions.  IMPRESSION: 1. There is a 1.7 cm enlarging sub solid nodule within the left lung apex. Possibility of  slow growing malignancy should be considered. Recommend multi disciplinary thoracic oncology consultation. 2. No acute process within the chest. thank 3. Aortic Atherosclerosis (ICD10-I70.0).   Electronically Signed   By: Lovey Newcomer M.D.   On: 09/01/2018 20:33  Scan from 2015: no mention of left  upper lobe lesion that is present    02/2014:   Recent Lab Findings: Lab Results  Component Value Date   WBC 8.1 09/21/2019   HGB 12.1 09/21/2019   HCT 38.5 09/21/2019   PLT 278 09/21/2019   GLUCOSE 100 (H) 09/21/2019   CHOL 286 (H) 02/15/2019   TRIG 139 02/15/2019   HDL 70 02/15/2019   LDLCALC 188 (H) 02/15/2019   ALT 13 09/21/2019   AST 16 09/21/2019   NA 133 (L) 09/21/2019   K 3.9 09/21/2019   CL 98 09/21/2019   CREATININE 0.79 09/21/2019   BUN 12 09/21/2019   CO2 23 09/21/2019   TSH 8.229 (H) 02/15/2019   INR 0.9 02/14/2019   HGBA1C 5.9 (H) 02/15/2019      Assessment / Plan:   Patient has a persistent parenchymal irregularity left upper lobe, that was present in 2015 and is incidentally noted again in 2019 when she obtain a CT scan for spontaneous bruising along the left neck and chest.   The area in question does appear to have enlarged very slightly over the 4 years,   I  have explained to the patient that the current findings on the CT scan do not rule out the possibility of a low-grade malignancy in the left upper lobe.  I discussed with her proceeding with  navigation bronchoscopy and attempted biopsy at this time.  After discussing the options the patient preferred to wait an additional 6 months before making a decision.  As noted on previous visits her underlying pulmonary disease would  Make resection carry higher surgical risk  We will plan on a follow-up CT super D format in 6 months per the patient's request, if continued enlargement is noted consider navigation bronchoscopy with biopsy.     Grace Isaac MD      [address] [place] [address] Office [phone]   Beeper [phone]  11/02/2020 3:54 PM

## 2020-11-03 DIAGNOSIS — Z23 Encounter for immunization: Secondary | ICD-10-CM | POA: Diagnosis not present

## 2021-01-27 DIAGNOSIS — H26493 Other secondary cataract, bilateral: Secondary | ICD-10-CM | POA: Diagnosis not present

## 2021-01-27 DIAGNOSIS — H04123 Dry eye syndrome of bilateral lacrimal glands: Secondary | ICD-10-CM | POA: Diagnosis not present

## 2021-01-27 DIAGNOSIS — E119 Type 2 diabetes mellitus without complications: Secondary | ICD-10-CM | POA: Diagnosis not present

## 2021-01-27 DIAGNOSIS — Z961 Presence of intraocular lens: Secondary | ICD-10-CM | POA: Diagnosis not present

## 2021-03-23 DIAGNOSIS — M48061 Spinal stenosis, lumbar region without neurogenic claudication: Secondary | ICD-10-CM | POA: Diagnosis not present

## 2021-03-23 DIAGNOSIS — M5116 Intervertebral disc disorders with radiculopathy, lumbar region: Secondary | ICD-10-CM | POA: Diagnosis not present

## 2021-03-23 DIAGNOSIS — M5416 Radiculopathy, lumbar region: Secondary | ICD-10-CM | POA: Diagnosis not present

## 2021-03-23 DIAGNOSIS — M4180 Other forms of scoliosis, site unspecified: Secondary | ICD-10-CM | POA: Diagnosis not present

## 2021-03-23 DIAGNOSIS — G8929 Other chronic pain: Secondary | ICD-10-CM | POA: Diagnosis not present

## 2021-03-23 DIAGNOSIS — M545 Low back pain, unspecified: Secondary | ICD-10-CM | POA: Diagnosis not present

## 2021-03-23 DIAGNOSIS — M5136 Other intervertebral disc degeneration, lumbar region: Secondary | ICD-10-CM | POA: Diagnosis not present

## 2021-04-07 ENCOUNTER — Other Ambulatory Visit: Payer: Self-pay | Admitting: Thoracic Surgery (Cardiothoracic Vascular Surgery)

## 2021-04-07 DIAGNOSIS — R911 Solitary pulmonary nodule: Secondary | ICD-10-CM

## 2021-04-30 ENCOUNTER — Ambulatory Visit: Payer: Medicare Other | Admitting: Thoracic Surgery (Cardiothoracic Vascular Surgery)

## 2021-04-30 ENCOUNTER — Other Ambulatory Visit: Payer: Medicare Other

## 2021-08-05 DIAGNOSIS — M5116 Intervertebral disc disorders with radiculopathy, lumbar region: Secondary | ICD-10-CM | POA: Diagnosis not present

## 2021-08-05 DIAGNOSIS — M4186 Other forms of scoliosis, lumbar region: Secondary | ICD-10-CM | POA: Diagnosis not present

## 2021-08-05 DIAGNOSIS — M48061 Spinal stenosis, lumbar region without neurogenic claudication: Secondary | ICD-10-CM | POA: Diagnosis not present

## 2021-08-05 DIAGNOSIS — M5416 Radiculopathy, lumbar region: Secondary | ICD-10-CM | POA: Diagnosis not present

## 2021-08-05 DIAGNOSIS — M4800 Spinal stenosis, site unspecified: Secondary | ICD-10-CM | POA: Diagnosis not present

## 2021-08-27 DIAGNOSIS — L400 Psoriasis vulgaris: Secondary | ICD-10-CM | POA: Diagnosis not present

## 2021-08-27 DIAGNOSIS — D8481 Immunodeficiency due to conditions classified elsewhere: Secondary | ICD-10-CM | POA: Diagnosis not present

## 2021-08-27 DIAGNOSIS — Z840 Family history of diseases of the skin and subcutaneous tissue: Secondary | ICD-10-CM | POA: Diagnosis not present

## 2021-09-01 DIAGNOSIS — E782 Mixed hyperlipidemia: Secondary | ICD-10-CM | POA: Diagnosis not present

## 2021-09-01 DIAGNOSIS — G894 Chronic pain syndrome: Secondary | ICD-10-CM | POA: Diagnosis not present

## 2021-09-01 DIAGNOSIS — Z23 Encounter for immunization: Secondary | ICD-10-CM | POA: Diagnosis not present

## 2021-09-01 DIAGNOSIS — E6609 Other obesity due to excess calories: Secondary | ICD-10-CM | POA: Diagnosis not present

## 2021-09-01 DIAGNOSIS — I10 Essential (primary) hypertension: Secondary | ICD-10-CM | POA: Diagnosis not present

## 2021-09-01 DIAGNOSIS — E7849 Other hyperlipidemia: Secondary | ICD-10-CM | POA: Diagnosis not present

## 2021-09-01 DIAGNOSIS — Z6829 Body mass index (BMI) 29.0-29.9, adult: Secondary | ICD-10-CM | POA: Diagnosis not present

## 2021-09-01 DIAGNOSIS — Z1331 Encounter for screening for depression: Secondary | ICD-10-CM | POA: Diagnosis not present

## 2021-09-01 DIAGNOSIS — E118 Type 2 diabetes mellitus with unspecified complications: Secondary | ICD-10-CM | POA: Diagnosis not present

## 2021-09-01 DIAGNOSIS — G2581 Restless legs syndrome: Secondary | ICD-10-CM | POA: Diagnosis not present

## 2021-09-01 DIAGNOSIS — E119 Type 2 diabetes mellitus without complications: Secondary | ICD-10-CM | POA: Diagnosis not present

## 2021-09-01 DIAGNOSIS — E559 Vitamin D deficiency, unspecified: Secondary | ICD-10-CM | POA: Diagnosis not present

## 2021-09-01 DIAGNOSIS — I639 Cerebral infarction, unspecified: Secondary | ICD-10-CM | POA: Diagnosis not present

## 2021-09-01 DIAGNOSIS — Z0001 Encounter for general adult medical examination with abnormal findings: Secondary | ICD-10-CM | POA: Diagnosis not present

## 2021-09-01 DIAGNOSIS — E039 Hypothyroidism, unspecified: Secondary | ICD-10-CM | POA: Diagnosis not present

## 2021-09-06 DIAGNOSIS — N390 Urinary tract infection, site not specified: Secondary | ICD-10-CM | POA: Diagnosis not present

## 2021-10-25 DIAGNOSIS — I10 Essential (primary) hypertension: Secondary | ICD-10-CM | POA: Diagnosis not present

## 2021-10-25 DIAGNOSIS — E7849 Other hyperlipidemia: Secondary | ICD-10-CM | POA: Diagnosis not present

## 2021-10-25 DIAGNOSIS — E559 Vitamin D deficiency, unspecified: Secondary | ICD-10-CM | POA: Diagnosis not present

## 2021-10-25 DIAGNOSIS — E039 Hypothyroidism, unspecified: Secondary | ICD-10-CM | POA: Diagnosis not present

## 2021-10-25 DIAGNOSIS — E118 Type 2 diabetes mellitus with unspecified complications: Secondary | ICD-10-CM | POA: Diagnosis not present

## 2021-12-28 DIAGNOSIS — E039 Hypothyroidism, unspecified: Secondary | ICD-10-CM | POA: Diagnosis not present

## 2022-01-17 DIAGNOSIS — H6123 Impacted cerumen, bilateral: Secondary | ICD-10-CM | POA: Diagnosis not present

## 2022-01-18 ENCOUNTER — Ambulatory Visit (HOSPITAL_COMMUNITY)
Admission: RE | Admit: 2022-01-18 | Discharge: 2022-01-18 | Disposition: A | Discharge status: Home / Self Care | Payer: Medicare Other | Source: Ambulatory Visit | Attending: Family Medicine | Admitting: Family Medicine

## 2022-01-18 ENCOUNTER — Other Ambulatory Visit (HOSPITAL_COMMUNITY): Payer: Self-pay | Admitting: Family Medicine

## 2022-01-18 ENCOUNTER — Other Ambulatory Visit: Payer: Self-pay

## 2022-01-18 DIAGNOSIS — Z6829 Body mass index (BMI) 29.0-29.9, adult: Secondary | ICD-10-CM | POA: Diagnosis not present

## 2022-01-18 DIAGNOSIS — M1991 Primary osteoarthritis, unspecified site: Secondary | ICD-10-CM | POA: Diagnosis not present

## 2022-01-18 DIAGNOSIS — E6609 Other obesity due to excess calories: Secondary | ICD-10-CM | POA: Diagnosis not present

## 2022-01-18 DIAGNOSIS — M25552 Pain in left hip: Secondary | ICD-10-CM | POA: Insufficient documentation

## 2022-01-18 DIAGNOSIS — M5136 Other intervertebral disc degeneration, lumbar region: Secondary | ICD-10-CM | POA: Diagnosis not present

## 2022-01-25 ENCOUNTER — Other Ambulatory Visit: Payer: Self-pay

## 2022-01-25 ENCOUNTER — Emergency Department (HOSPITAL_COMMUNITY)
Admission: EM | Admit: 2022-01-25 | Discharge: 2022-01-26 | Disposition: A | Discharge status: Home / Self Care | Payer: Medicare Other | Attending: Emergency Medicine | Admitting: Emergency Medicine

## 2022-01-25 ENCOUNTER — Encounter (HOSPITAL_COMMUNITY): Payer: Self-pay | Admitting: Emergency Medicine

## 2022-01-25 ENCOUNTER — Emergency Department (HOSPITAL_COMMUNITY): Payer: Medicare Other

## 2022-01-25 DIAGNOSIS — Z7984 Long term (current) use of oral hypoglycemic drugs: Secondary | ICD-10-CM | POA: Diagnosis not present

## 2022-01-25 DIAGNOSIS — J40 Bronchitis, not specified as acute or chronic: Secondary | ICD-10-CM | POA: Diagnosis not present

## 2022-01-25 DIAGNOSIS — I10 Essential (primary) hypertension: Secondary | ICD-10-CM | POA: Diagnosis not present

## 2022-01-25 DIAGNOSIS — E119 Type 2 diabetes mellitus without complications: Secondary | ICD-10-CM | POA: Insufficient documentation

## 2022-01-25 DIAGNOSIS — R0602 Shortness of breath: Secondary | ICD-10-CM | POA: Diagnosis present

## 2022-01-25 DIAGNOSIS — Z20822 Contact with and (suspected) exposure to covid-19: Secondary | ICD-10-CM | POA: Diagnosis not present

## 2022-01-25 MED ORDER — ALBUTEROL SULFATE HFA 108 (90 BASE) MCG/ACT IN AERS
2.0000 | INHALATION_SPRAY | RESPIRATORY_TRACT | Status: DC | PRN
  Administered 2022-01-25: 2 via RESPIRATORY_TRACT
  Filled 2022-01-25: qty 6.7

## 2022-01-25 NOTE — ED Notes (Signed)
Patient transported to X-ray 

## 2022-01-25 NOTE — ED Provider Notes (Signed)
[facility] Provider Note   CSN: [id number] Arrival date & time: 01/25/22  2141     History  Chief Complaint  Patient presents with   Shortness of Breath    Brittany Goodman is a 76 y.o. female.   Shortness of Breath Patient presents for shortness of breath.  Symptoms have been present for the past 2 days.  This evening, she woke up from her nap extremely short of breath.  Since that time, her breathing has improved.  She has had a cough that is dry nonproductive.  She has no known chronic lung disease.  Previously, she had a enlarging lung nodule that was identified on imaging.  She was being worked up for this but was lost to follow-up over a year ago.  Per chart review, additional medical history includes HLD, HTN, GERD, CVA, depression, OSA, and DM 2.    Home Medications Prior to Admission medications   Medication Sig Start Date End Date Taking? Authorizing Provider  azithromycin (ZITHROMAX) 250 MG tablet Take 1 tablet (250 mg total) by mouth daily. Take first 2 tablets together, then 1 every day until finished. 01/26/22  Yes Pollina, Gwenyth Allegra, MD  HYDROcodone bit-homatropine (HYCODAN) 5-1.5 MG/5ML syrup Take 5 mLs by mouth every 6 (six) hours as needed for cough. 01/26/22  Yes Pollina, Gwenyth Allegra, MD  acetaminophen (TYLENOL) 500 MG tablet Take 1,000 mg by mouth 2 (two) times daily as needed for mild pain or moderate pain.     [provider]  Ascorbic Acid (VITAMIN C) 1000 MG tablet Take 1,000-2,000 mg by mouth daily.     [provider]  atenolol (TENORMIN) 100 MG tablet Take 100 mg by mouth every morning.    [provider]  Biotin 5000 MCG TABS Take 1 tablet by mouth daily.    [provider]  Cholecalciferol (VITAMIN D) 125 MCG (5000 UT) CAPS Take 1 capsule by mouth daily.     [provider]  citalopram (CELEXA) 20 MG tablet Take 20 mg by mouth every other day.    [provider]  cyclobenzaprine  (FLEXERIL) 10 MG tablet Take 10 mg by mouth 3 (three) times daily as needed for muscle spasms.    [provider]  folic acid (FOLVITE) 097 MCG tablet Take 800 mcg by mouth daily.     [provider]  glimepiride (AMARYL) 1 MG tablet Take 1 mg by mouth daily before breakfast. 04/23/13   [provider]  HYDROcodone-acetaminophen (NORCO/VICODIN) 5-325 MG tablet Take 1 tablet by mouth every 4 (four) hours as needed for moderate pain.  03/10/17   [provider]  levothyroxine (SYNTHROID) 100 MCG tablet Take 88 mcg by mouth daily before breakfast.    [provider]  lisinopril (PRINIVIL,ZESTRIL) 10 MG tablet Take 1 tablet (10 mg total) by mouth daily for 30 days. 12/03/18 01/31/19  Johnson, Clanford L, MD  loratadine (CLARITIN) 10 MG tablet Take 10 mg by mouth daily.    [provider]  metFORMIN (GLUCOPHAGE) 1000 MG tablet Take 1,000 mg by mouth 2 (two) times daily with a meal.  12/01/16   [provider]  NONFORMULARY OR COMPOUNDED ITEM Apply 1 application topically 3 (three) times daily as needed (Pain). Diclofenac, Baclofen, Gabapentin, Lidocaine, Menthol - Compounded at [employer], Historical, MD  omeprazole (PRILOSEC) 20 MG capsule Take 20 mg by mouth daily.    [provider]  polyethylene glycol (MIRALAX / GLYCOLAX) packet  Take 17 g by mouth daily as needed for moderate constipation or severe constipation. 12/02/18   Johnson, Clanford L, MD  simvastatin (ZOCOR) 10 MG tablet Take 1 tablet (10 mg total) by mouth daily at 6 PM for 30 days. 02/16/19 08/07/19  Manuella Ghazi, Pratik D, DO  vitamin B-12 (CYANOCOBALAMIN) 1000 MCG tablet Take 1,000 mcg by mouth daily.    [provider]      Allergies    Celecoxib, Codeine, Dilaudid [hydromorphone hcl], Morphine, Statins, and Tetracycline    Review of Systems   Review of Systems  Respiratory:  Positive for shortness of breath.   All other systems reviewed and are  negative.  Physical Exam Updated Vital Signs BP 131/78    Pulse 74    Temp 98 F (36.7 C)    Resp 17    Ht 5' 5"  (1.651 m)    Wt 77.1 kg    SpO2 94%    BMI 28.29 kg/m  Physical Exam Vitals and nursing note reviewed.  Constitutional:      General: She is not in acute distress.    Appearance: She is well-developed and normal weight. She is not ill-appearing, toxic-appearing or diaphoretic.  HENT:     Head: Normocephalic and atraumatic.     Mouth/Throat:     Mouth: Mucous membranes are moist.     Pharynx: Oropharynx is clear.  Eyes:     Extraocular Movements: Extraocular movements intact.     Conjunctiva/sclera: Conjunctivae normal.  Neck:     Vascular: No JVD.  Cardiovascular:     Rate and Rhythm: Normal rate and regular rhythm.     Heart sounds: No murmur heard. Pulmonary:     Effort: Pulmonary effort is normal. No tachypnea, accessory muscle usage or respiratory distress.     Breath sounds: Normal breath sounds. No decreased breath sounds, wheezing, rhonchi or rales.  Chest:     Chest wall: No mass or tenderness.  Abdominal:     Palpations: Abdomen is soft.     Tenderness: There is no abdominal tenderness.  Musculoskeletal:        General: No swelling. Normal range of motion.     Cervical back: Normal range of motion and neck supple.     Right lower leg: No edema.     Left lower leg: No edema.  Skin:    General: Skin is warm and dry.     Capillary Refill: Capillary refill takes less than 2 seconds.     Coloration: Skin is not cyanotic or pale.  Neurological:     General: No focal deficit present.     Mental Status: She is alert and oriented to person, place, and time.     Cranial Nerves: No cranial nerve deficit.     Motor: No weakness.  Psychiatric:        Mood and Affect: Mood normal.        Behavior: Behavior normal.    ED Results / Procedures / Treatments   Labs (all labs ordered are listed, but only abnormal results are displayed) Labs Reviewed   COMPREHENSIVE METABOLIC PANEL - Abnormal; Notable for the following components:      Result Value   Glucose, Bld 128 (*)    Total Bilirubin 0.2 (*)    All other components within normal limits  CBC WITH DIFFERENTIAL/PLATELET - Abnormal; Notable for the following components:   WBC 11.3 (*)    Eosinophils Absolute 0.6 (*)    All other components within  normal limits  RESP PANEL BY RT-PCR (FLU A&B, COVID) [id number]  BRAIN NATRIURETIC PEPTIDE  MAGNESIUM  TROPONIN I (HIGH SENSITIVITY)  TROPONIN I (HIGH SENSITIVITY)    EKG EKG Interpretation  Date/Time:  Tuesday January 25 2022 21:51:26 EST Ventricular Rate:  84 PR Interval:  149 QRS Duration: 93 QT Interval:  392 QTC Calculation: 464 R Axis:   -14 Text Interpretation: Sinus rhythm Abnormal R-wave progression, early transition Left ventricular hypertrophy Confirmed by Godfrey Pick [phone]) on 01/25/2022 11:16:15 PM  Radiology DG Chest 2 View  Result Date: 01/25/2022 CLINICAL DATA:  Shortness of breath for 2 days. EXAM: CHEST - 2 VIEW COMPARISON:  Radiograph 02/15/2019, CT 10/29/2020 FINDINGS: Lung volumes are low. The heart is normal in size. Stable mediastinal contours. Aortic atherosclerosis. Streaky atelectasis at the lung bases. No confluent consolidation. No pleural effusion or pneumothorax. No pulmonary edema. No acute osseous findings. IMPRESSION: Low lung volumes with bibasilar atelectasis. Electronically Signed   By: Keith Rake M.D.   On: 01/25/2022 22:44   CT Angio Chest Pulmonary Embolism (PE) W or WO Contrast  Result Date: 01/26/2022 CLINICAL DATA:  Shortness of breath for 2 days, initial encounter EXAM: CT ANGIOGRAPHY CHEST WITH CONTRAST TECHNIQUE: Multidetector CT imaging of the chest was performed using the standard protocol during bolus administration of intravenous contrast. Multiplanar CT image reconstructions and MIPs were obtained to evaluate the vascular anatomy. RADIATION DOSE REDUCTION: This exam was performed according  to the departmental dose-optimization program which includes automated exposure control, adjustment of the mA and/or kV according to patient size and/or use of iterative reconstruction technique. CONTRAST:  116m OMNIPAQUE IOHEXOL 350 MG/ML SOLN COMPARISON:  Chest x-ray from the previous day. FINDINGS: Cardiovascular: Thoracic aorta demonstrates atherosclerotic calcifications. No aneurysmal dilatation or gross dissection is seen. No cardiac enlargement is noted. Coronary calcifications are noted. The pulmonary artery shows a normal branching pattern without intraluminal filling defect to suggest pulmonary embolism. Mediastinum/Nodes: Thoracic inlet is within normal limits. No sizable hilar or mediastinal adenopathy is noted. The esophagus is air-filled but otherwise within normal limits. Lungs/Pleura: Scarring is noted in the left upper lobe stable in appearance from the prior study. Mild emphysematous changes are noted. No focal infiltrate or sizable effusion is seen. Some inspissated mucus is noted in the lower lobe bronchial tree. A few small sub solid nodules are noted in the right upper lobe the largest of which measures 5 mm on image number 52 of series 7. These are likely postinflammatory in nature. Upper Abdomen: Visualized upper abdomen is within normal limits. Musculoskeletal: Degenerative changes of the thoracic spine are noted. No rib abnormality is noted. Review of the MIP images confirms the above findings. IMPRESSION: No evidence of pulmonary emboli. Scattered sub solid nodules in the right upper lobe measuring up to 5 mm. Non-contrast chest CT at 3-6 months is recommended. If nodules persist and are stable at that time, consider additional non-contrast chest CT examinations at 2 and 4 years. This recommendation follows the consensus statement: Guidelines for Management of Incidental Pulmonary Nodules Detected on CT Images: From the Fleischner Society 2017; Radiology 2017; 284:228-243. Stable scarring  in the left upper lobe Aortic Atherosclerosis (ICD10-I70.0) and Emphysema (ICD10-J43.9). Electronically Signed   By: MInez CatalinaM.D.   On: 01/26/2022 02:59    Procedures Procedures    Medications Ordered in ED Medications  chlorpheniramine-HYDROcodone 10-8 MG/5ML suspension 5 mL (5 mLs Oral Given 01/26/22 0140)  iohexol (OMNIPAQUE) 350 MG/ML injection 100 mL (100 mLs Intravenous Contrast Given 01/26/22  0237)    ED Course/ Medical Decision Making/ A&P                           Medical Decision Making Amount and/or Complexity of Data Reviewed Labs: ordered. Radiology: ordered.  Risk Prescription drug management.   This patient presents to the ED for concern of shortness of breath, this involves an extensive number of treatment options, and is a complaint that carries with it a high risk of complications and morbidity.  The differential diagnosis includes pneumonia, PE, CHF, ACS, anxiety   Co morbidities that complicate the patient evaluation  HLD, HTN, GERD, CVA, depression, OSA, and DM 2   Additional history obtained:  Additional history obtained from patient's son External records from outside source obtained and reviewed including EMR   Lab Tests:  I Ordered, and personally interpreted labs.  The pertinent results include: Pending at time of signout   Imaging Studies ordered:  I ordered imaging studies including chest x-ray I independently visualized and interpreted imaging which showed no acute findings I agree with the radiologist interpretation   Cardiac Monitoring:  The patient was maintained on a cardiac monitor.  I personally viewed and interpreted the cardiac monitored which showed an underlying rhythm of: Sinus rhythm    Problem List / ED Course:  Patient is a 76 year old female who presents for 2 days of shortness of breath.  This acutely worsened this evening when she woke up from her nap.  She reports that at the time she was gasping for breath but  this has since resolved.  Currently, she feels like her breathing is back to normal.  She has had a dry cough.  On arrival in the ED, she is well-appearing.  Her breathing is unlabored and her SPO2 is normal on room air.  Lungs are clear to auscultation.  Diagnostic work-up was initiated.  Prior to results, care patient was signed out to oncoming ED provider.    Social Determinants of Health:  Lives independently           Final Clinical Impression(s) / ED Diagnoses Final diagnoses:  Bronchitis    Rx / DC Orders ED Discharge Orders          Ordered    HYDROcodone bit-homatropine (HYCODAN) 5-1.5 MG/5ML syrup  Every 6 hours PRN        01/26/22 0418    azithromycin (ZITHROMAX) 250 MG tablet  Daily        01/26/22 0418              Godfrey Pick, MD 01/26/22 1535

## 2022-01-25 NOTE — ED Triage Notes (Signed)
Pt c/o SOB x 2 days, states that she woke up extremely SOB and unable to catch her breath. Pt states that her family has been sick with some sort of respiratory illness, but was negative for covid. Pt ambulatory to treatment room and in NAD.  ?

## 2022-01-26 ENCOUNTER — Emergency Department (HOSPITAL_COMMUNITY): Payer: Medicare Other

## 2022-01-26 DIAGNOSIS — J40 Bronchitis, not specified as acute or chronic: Secondary | ICD-10-CM | POA: Diagnosis not present

## 2022-01-26 LAB — CBC WITH DIFFERENTIAL/PLATELET
Abs Immature Granulocytes: 0.05 10*3/uL (ref 0.00–0.07)
Basophils Absolute: 0.1 10*3/uL (ref 0.0–0.1)
Basophils Relative: 1 %
Eosinophils Absolute: 0.6 10*3/uL — ABNORMAL HIGH (ref 0.0–0.5)
Eosinophils Relative: 5 %
HCT: 41 % (ref 36.0–46.0)
Hemoglobin: 13.6 g/dL (ref 12.0–15.0)
Immature Granulocytes: 0 %
Lymphocytes Relative: 24 %
Lymphs Abs: 2.7 10*3/uL (ref 0.7–4.0)
MCH: 29.7 pg (ref 26.0–34.0)
MCHC: 33.2 g/dL (ref 30.0–36.0)
MCV: 89.5 fL (ref 80.0–100.0)
Monocytes Absolute: 0.7 10*3/uL (ref 0.1–1.0)
Monocytes Relative: 6 %
Neutro Abs: 7.1 10*3/uL (ref 1.7–7.7)
Neutrophils Relative %: 64 %
Platelets: 236 10*3/uL (ref 150–400)
RBC: 4.58 MIL/uL (ref 3.87–5.11)
RDW: 13.6 % (ref 11.5–15.5)
WBC: 11.3 10*3/uL — ABNORMAL HIGH (ref 4.0–10.5)
nRBC: 0 % (ref 0.0–0.2)

## 2022-01-26 LAB — TROPONIN I (HIGH SENSITIVITY)
Troponin I (High Sensitivity): 3 ng/L (ref <18)
Troponin I (High Sensitivity): 3 ng/L (ref <18)

## 2022-01-26 LAB — COMPREHENSIVE METABOLIC PANEL
ALT: 16 U/L (ref 0–44)
AST: 35 U/L (ref 15–41)
Albumin: 3.7 g/dL (ref 3.5–5.0)
Alkaline Phosphatase: 62 U/L (ref 38–126)
Anion gap: 11 (ref 5–15)
BUN: 13 mg/dL (ref 8–23)
CO2: 23 mmol/L (ref 22–32)
Calcium: 9.1 mg/dL (ref 8.9–10.3)
Chloride: 102 mmol/L (ref 98–111)
Creatinine, Ser: 0.78 mg/dL (ref 0.44–1.00)
GFR, Estimated: >60 mL/min (ref >60)
Glucose, Bld: 128 mg/dL — ABNORMAL HIGH (ref 70–99)
Potassium: 3.6 mmol/L (ref 3.5–5.1)
Sodium: 136 mmol/L (ref 135–145)
Total Bilirubin: 0.2 mg/dL — ABNORMAL LOW (ref 0.3–1.2)
Total Protein: 7.1 g/dL (ref 6.5–8.1)

## 2022-01-26 LAB — MAGNESIUM: Magnesium: 1.8 mg/dL (ref 1.7–2.4)

## 2022-01-26 LAB — RESP PANEL BY RT-PCR (FLU A&B, COVID) ARPGX2
Influenza A by PCR: NEGATIVE
Influenza B by PCR: NEGATIVE
SARS Coronavirus 2 by RT PCR: NEGATIVE

## 2022-01-26 LAB — BRAIN NATRIURETIC PEPTIDE: B Natriuretic Peptide: 38 pg/mL (ref 0.0–100.0)

## 2022-01-26 MED ORDER — IOHEXOL 350 MG/ML SOLN
100.0000 mL | Freq: Once | INTRAVENOUS | Status: AC | PRN
  Administered 2022-01-26: 100 mL via INTRAVENOUS

## 2022-01-26 MED ORDER — HYDROCODONE BIT-HOMATROP MBR 5-1.5 MG/5ML PO SOLN: 5.0000 mL | Freq: Four times a day (QID) | ORAL | 0 refills | Status: DC | PRN

## 2022-01-26 MED ORDER — HYDROCOD POLI-CHLORPHE POLI ER 10-8 MG/5ML PO SUER
5.0000 mL | Freq: Once | ORAL | Status: AC
  Administered 2022-01-26: 5 mL via ORAL
  Filled 2022-01-26: qty 5

## 2022-01-26 MED ORDER — AZITHROMYCIN 250 MG PO TABS: 250.0000 mg | ORAL_TABLET | Freq: Every day | ORAL | 0 refills | Status: DC

## 2022-01-26 MED ORDER — ONDANSETRON 8 MG PO TBDP
8.0000 mg | ORAL_TABLET | Freq: Once | ORAL | Status: DC
Start: 2022-01-26 — End: 2022-01-26
  Filled 2022-01-26: qty 1

## 2022-01-26 NOTE — ED Provider Notes (Signed)
Patient signed out to me by Dr. Doren Custard.  Patient with shortness of breath and cough.  Patient has multiple family members at home that have recently had similar symptoms.  Patient's initial work-up negative.  She does have a history of lung nodule, CT angiography performed to further evaluate for PE, occult pneumonia, reevaluate for nodules.  No acute findings are present.  She was informed of the diffuse nodules and reports that she is already being evaluated for these, monitored by specialist.  No further work-up necessary at this time.  She was given Tussionex and her cough is better.  She has not hypoxic, does not require hospitalization at this time. ?  ?Orpah Greek, MD ?01/26/22 [phone] ? ?

## 2022-05-05 DIAGNOSIS — M1991 Primary osteoarthritis, unspecified site: Secondary | ICD-10-CM | POA: Diagnosis not present

## 2022-05-05 DIAGNOSIS — I10 Essential (primary) hypertension: Secondary | ICD-10-CM | POA: Diagnosis not present

## 2022-05-05 DIAGNOSIS — Z6830 Body mass index (BMI) 30.0-30.9, adult: Secondary | ICD-10-CM | POA: Diagnosis not present

## 2022-05-05 DIAGNOSIS — E039 Hypothyroidism, unspecified: Secondary | ICD-10-CM | POA: Diagnosis not present

## 2022-05-05 DIAGNOSIS — G2581 Restless legs syndrome: Secondary | ICD-10-CM | POA: Diagnosis not present

## 2022-05-05 DIAGNOSIS — M5136 Other intervertebral disc degeneration, lumbar region: Secondary | ICD-10-CM | POA: Diagnosis not present

## 2022-05-05 DIAGNOSIS — I639 Cerebral infarction, unspecified: Secondary | ICD-10-CM | POA: Diagnosis not present

## 2022-05-05 DIAGNOSIS — E118 Type 2 diabetes mellitus with unspecified complications: Secondary | ICD-10-CM | POA: Diagnosis not present

## 2022-05-27 DIAGNOSIS — M5416 Radiculopathy, lumbar region: Secondary | ICD-10-CM | POA: Diagnosis not present

## 2022-05-27 DIAGNOSIS — M4800 Spinal stenosis, site unspecified: Secondary | ICD-10-CM | POA: Diagnosis not present

## 2022-06-30 DIAGNOSIS — G894 Chronic pain syndrome: Secondary | ICD-10-CM | POA: Diagnosis not present

## 2022-06-30 DIAGNOSIS — N390 Urinary tract infection, site not specified: Secondary | ICD-10-CM | POA: Diagnosis not present

## 2022-06-30 DIAGNOSIS — Z6830 Body mass index (BMI) 30.0-30.9, adult: Secondary | ICD-10-CM | POA: Diagnosis not present

## 2022-10-05 DIAGNOSIS — R52 Pain, unspecified: Secondary | ICD-10-CM | POA: Diagnosis not present

## 2022-10-05 DIAGNOSIS — G8918 Other acute postprocedural pain: Secondary | ICD-10-CM | POA: Diagnosis not present

## 2022-10-05 DIAGNOSIS — M48061 Spinal stenosis, lumbar region without neurogenic claudication: Secondary | ICD-10-CM | POA: Diagnosis not present

## 2022-11-07 ENCOUNTER — Other Ambulatory Visit (HOSPITAL_COMMUNITY): Payer: Self-pay | Admitting: Family Medicine

## 2022-11-07 DIAGNOSIS — Z6830 Body mass index (BMI) 30.0-30.9, adult: Secondary | ICD-10-CM | POA: Diagnosis not present

## 2022-11-07 DIAGNOSIS — D491 Neoplasm of unspecified behavior of respiratory system: Secondary | ICD-10-CM

## 2022-11-07 DIAGNOSIS — E782 Mixed hyperlipidemia: Secondary | ICD-10-CM | POA: Diagnosis not present

## 2022-11-07 DIAGNOSIS — E538 Deficiency of other specified B group vitamins: Secondary | ICD-10-CM | POA: Diagnosis not present

## 2022-11-07 DIAGNOSIS — E039 Hypothyroidism, unspecified: Secondary | ICD-10-CM | POA: Diagnosis not present

## 2022-11-07 DIAGNOSIS — E119 Type 2 diabetes mellitus without complications: Secondary | ICD-10-CM | POA: Diagnosis not present

## 2022-11-07 DIAGNOSIS — E790 Hyperuricemia without signs of inflammatory arthritis and tophaceous disease: Secondary | ICD-10-CM | POA: Diagnosis not present

## 2022-11-07 DIAGNOSIS — E118 Type 2 diabetes mellitus with unspecified complications: Secondary | ICD-10-CM | POA: Diagnosis not present

## 2022-11-07 DIAGNOSIS — I700 Atherosclerosis of aorta: Secondary | ICD-10-CM | POA: Diagnosis not present

## 2022-11-07 DIAGNOSIS — Z1331 Encounter for screening for depression: Secondary | ICD-10-CM | POA: Diagnosis not present

## 2022-11-07 DIAGNOSIS — Z0001 Encounter for general adult medical examination with abnormal findings: Secondary | ICD-10-CM | POA: Diagnosis not present

## 2022-11-08 ENCOUNTER — Other Ambulatory Visit (HOSPITAL_COMMUNITY): Payer: Self-pay | Admitting: Family Medicine

## 2022-11-08 DIAGNOSIS — Z1231 Encounter for screening mammogram for malignant neoplasm of breast: Secondary | ICD-10-CM

## 2022-11-16 ENCOUNTER — Ambulatory Visit (HOSPITAL_COMMUNITY)
Admission: RE | Admit: 2022-11-16 | Discharge: 2022-11-16 | Disposition: A | Discharge status: Home / Self Care | Payer: Medicare HMO | Source: Ambulatory Visit | Attending: Family Medicine | Admitting: Family Medicine

## 2022-11-16 DIAGNOSIS — Z1231 Encounter for screening mammogram for malignant neoplasm of breast: Secondary | ICD-10-CM | POA: Insufficient documentation

## 2022-11-16 DIAGNOSIS — I700 Atherosclerosis of aorta: Secondary | ICD-10-CM | POA: Diagnosis not present

## 2022-11-16 DIAGNOSIS — R918 Other nonspecific abnormal finding of lung field: Secondary | ICD-10-CM | POA: Diagnosis not present

## 2022-11-16 DIAGNOSIS — D491 Neoplasm of unspecified behavior of respiratory system: Secondary | ICD-10-CM

## 2023-06-12 ENCOUNTER — Ambulatory Visit (HOSPITAL_COMMUNITY)
Admission: RE | Admit: 2023-06-12 | Discharge: 2023-06-12 | Disposition: A | Discharge status: Home / Self Care | Payer: Medicare HMO | Source: Ambulatory Visit | Attending: Family Medicine | Admitting: Family Medicine

## 2023-06-12 ENCOUNTER — Other Ambulatory Visit (HOSPITAL_COMMUNITY): Payer: Self-pay | Admitting: Family Medicine

## 2023-06-12 DIAGNOSIS — S0990XA Unspecified injury of head, initial encounter: Secondary | ICD-10-CM | POA: Diagnosis not present

## 2023-06-12 DIAGNOSIS — E782 Mixed hyperlipidemia: Secondary | ICD-10-CM | POA: Diagnosis not present

## 2023-06-12 DIAGNOSIS — S0093XA Contusion of unspecified part of head, initial encounter: Secondary | ICD-10-CM | POA: Diagnosis not present

## 2023-06-12 DIAGNOSIS — F419 Anxiety disorder, unspecified: Secondary | ICD-10-CM | POA: Diagnosis not present

## 2023-06-12 DIAGNOSIS — I10 Essential (primary) hypertension: Secondary | ICD-10-CM | POA: Diagnosis not present

## 2023-06-12 DIAGNOSIS — E1159 Type 2 diabetes mellitus with other circulatory complications: Secondary | ICD-10-CM | POA: Diagnosis not present

## 2023-06-12 DIAGNOSIS — R296 Repeated falls: Secondary | ICD-10-CM | POA: Diagnosis not present

## 2023-06-12 DIAGNOSIS — E538 Deficiency of other specified B group vitamins: Secondary | ICD-10-CM | POA: Diagnosis not present

## 2023-06-12 DIAGNOSIS — E790 Hyperuricemia without signs of inflammatory arthritis and tophaceous disease: Secondary | ICD-10-CM | POA: Diagnosis not present

## 2023-06-12 DIAGNOSIS — M1991 Primary osteoarthritis, unspecified site: Secondary | ICD-10-CM | POA: Diagnosis not present

## 2023-06-12 DIAGNOSIS — I700 Atherosclerosis of aorta: Secondary | ICD-10-CM | POA: Diagnosis not present

## 2023-06-12 DIAGNOSIS — E119 Type 2 diabetes mellitus without complications: Secondary | ICD-10-CM | POA: Diagnosis not present

## 2023-06-12 DIAGNOSIS — Z6830 Body mass index (BMI) 30.0-30.9, adult: Secondary | ICD-10-CM | POA: Diagnosis not present

## 2023-06-12 DIAGNOSIS — E039 Hypothyroidism, unspecified: Secondary | ICD-10-CM | POA: Diagnosis not present

## 2023-07-10 ENCOUNTER — Encounter (HOSPITAL_COMMUNITY): Payer: Self-pay | Admitting: *Deleted

## 2023-07-10 ENCOUNTER — Other Ambulatory Visit: Payer: Self-pay

## 2023-07-10 ENCOUNTER — Inpatient Hospital Stay (HOSPITAL_COMMUNITY)
Admission: EM | Admit: 2023-07-10 | Discharge: 2023-07-12 | DRG: 392 | Disposition: A | Discharge status: Home / Self Care | Payer: Medicare HMO | Attending: Family Medicine | Admitting: Family Medicine

## 2023-07-10 ENCOUNTER — Emergency Department (HOSPITAL_COMMUNITY): Payer: Medicare HMO

## 2023-07-10 DIAGNOSIS — K529 Noninfective gastroenteritis and colitis, unspecified: Principal | ICD-10-CM | POA: Diagnosis present

## 2023-07-10 DIAGNOSIS — K219 Gastro-esophageal reflux disease without esophagitis: Secondary | ICD-10-CM | POA: Diagnosis present

## 2023-07-10 DIAGNOSIS — K921 Melena: Principal | ICD-10-CM

## 2023-07-10 DIAGNOSIS — E118 Type 2 diabetes mellitus with unspecified complications: Secondary | ICD-10-CM | POA: Diagnosis present

## 2023-07-10 DIAGNOSIS — K551 Chronic vascular disorders of intestine: Secondary | ICD-10-CM | POA: Diagnosis not present

## 2023-07-10 DIAGNOSIS — Z86010 Personal history of colonic polyps: Secondary | ICD-10-CM | POA: Diagnosis not present

## 2023-07-10 DIAGNOSIS — I639 Cerebral infarction, unspecified: Secondary | ICD-10-CM | POA: Diagnosis present

## 2023-07-10 DIAGNOSIS — E782 Mixed hyperlipidemia: Secondary | ICD-10-CM | POA: Diagnosis present

## 2023-07-10 DIAGNOSIS — F32A Depression, unspecified: Secondary | ICD-10-CM | POA: Diagnosis present

## 2023-07-10 DIAGNOSIS — Z90710 Acquired absence of both cervix and uterus: Secondary | ICD-10-CM | POA: Diagnosis not present

## 2023-07-10 DIAGNOSIS — Z531 Procedure and treatment not carried out because of patient's decision for reasons of belief and group pressure: Secondary | ICD-10-CM | POA: Diagnosis not present

## 2023-07-10 DIAGNOSIS — I679 Cerebrovascular disease, unspecified: Secondary | ICD-10-CM | POA: Diagnosis present

## 2023-07-10 DIAGNOSIS — Z7902 Long term (current) use of antithrombotics/antiplatelets: Secondary | ICD-10-CM | POA: Diagnosis not present

## 2023-07-10 DIAGNOSIS — Z79890 Hormone replacement therapy (postmenopausal): Secondary | ICD-10-CM

## 2023-07-10 DIAGNOSIS — E039 Hypothyroidism, unspecified: Secondary | ICD-10-CM | POA: Diagnosis not present

## 2023-07-10 DIAGNOSIS — E871 Hypo-osmolality and hyponatremia: Secondary | ICD-10-CM | POA: Diagnosis present

## 2023-07-10 DIAGNOSIS — Z888 Allergy status to other drugs, medicaments and biological substances status: Secondary | ICD-10-CM | POA: Diagnosis not present

## 2023-07-10 DIAGNOSIS — I10 Essential (primary) hypertension: Secondary | ICD-10-CM | POA: Diagnosis present

## 2023-07-10 DIAGNOSIS — K5792 Diverticulitis of intestine, part unspecified, without perforation or abscess without bleeding: Secondary | ICD-10-CM | POA: Diagnosis not present

## 2023-07-10 DIAGNOSIS — Z79899 Other long term (current) drug therapy: Secondary | ICD-10-CM | POA: Diagnosis not present

## 2023-07-10 DIAGNOSIS — I701 Atherosclerosis of renal artery: Secondary | ICD-10-CM | POA: Diagnosis not present

## 2023-07-10 DIAGNOSIS — Z8249 Family history of ischemic heart disease and other diseases of the circulatory system: Secondary | ICD-10-CM | POA: Diagnosis not present

## 2023-07-10 DIAGNOSIS — Z83719 Family history of colon polyps, unspecified: Secondary | ICD-10-CM | POA: Diagnosis not present

## 2023-07-10 DIAGNOSIS — Z7984 Long term (current) use of oral hypoglycemic drugs: Secondary | ICD-10-CM | POA: Diagnosis not present

## 2023-07-10 DIAGNOSIS — G4733 Obstructive sleep apnea (adult) (pediatric): Secondary | ICD-10-CM | POA: Diagnosis not present

## 2023-07-10 DIAGNOSIS — Z885 Allergy status to narcotic agent status: Secondary | ICD-10-CM

## 2023-07-10 DIAGNOSIS — I771 Stricture of artery: Secondary | ICD-10-CM | POA: Diagnosis not present

## 2023-07-10 DIAGNOSIS — Z8673 Personal history of transient ischemic attack (TIA), and cerebral infarction without residual deficits: Secondary | ICD-10-CM

## 2023-07-10 DIAGNOSIS — R1032 Left lower quadrant pain: Secondary | ICD-10-CM

## 2023-07-10 DIAGNOSIS — E119 Type 2 diabetes mellitus without complications: Secondary | ICD-10-CM

## 2023-07-10 DIAGNOSIS — K5100 Ulcerative (chronic) pancolitis without complications: Secondary | ICD-10-CM | POA: Diagnosis not present

## 2023-07-10 DIAGNOSIS — Z87891 Personal history of nicotine dependence: Secondary | ICD-10-CM | POA: Diagnosis not present

## 2023-07-10 DIAGNOSIS — R197 Diarrhea, unspecified: Secondary | ICD-10-CM | POA: Diagnosis present

## 2023-07-10 DIAGNOSIS — N2889 Other specified disorders of kidney and ureter: Secondary | ICD-10-CM | POA: Diagnosis not present

## 2023-07-10 LAB — BASIC METABOLIC PANEL
Anion gap: 13 (ref 5–15)
BUN: 19 mg/dL (ref 8–23)
CO2: 20 mmol/L — ABNORMAL LOW (ref 22–32)
Calcium: 9.5 mg/dL (ref 8.9–10.3)
Chloride: 97 mmol/L — ABNORMAL LOW (ref 98–111)
Creatinine, Ser: 1.14 mg/dL — ABNORMAL HIGH (ref 0.44–1.00)
GFR, Estimated: 50 mL/min — ABNORMAL LOW (ref >60)
Glucose, Bld: 191 mg/dL — ABNORMAL HIGH (ref 70–99)
Potassium: 4.2 mmol/L (ref 3.5–5.1)
Sodium: 130 mmol/L — ABNORMAL LOW (ref 135–145)

## 2023-07-10 LAB — PROTIME-INR
INR: 1 (ref 0.8–1.2)
Prothrombin Time: 13.6 seconds (ref 11.4–15.2)

## 2023-07-10 LAB — LACTIC ACID, PLASMA: Lactic Acid, Venous: 2.8 mmol/L (ref 0.5–1.9)

## 2023-07-10 LAB — CBC WITH DIFFERENTIAL/PLATELET
Abs Immature Granulocytes: 0.1 10*3/uL — ABNORMAL HIGH (ref 0.00–0.07)
Basophils Absolute: 0.1 10*3/uL (ref 0.0–0.1)
Basophils Relative: 1 %
Eosinophils Absolute: 0.1 10*3/uL (ref 0.0–0.5)
Eosinophils Relative: 0 %
HCT: 45.3 % (ref 36.0–46.0)
Hemoglobin: 14.8 g/dL (ref 12.0–15.0)
Immature Granulocytes: 1 %
Lymphocytes Relative: 7 %
Lymphs Abs: 1.2 10*3/uL (ref 0.7–4.0)
MCH: 28.1 pg (ref 26.0–34.0)
MCHC: 32.7 g/dL (ref 30.0–36.0)
MCV: 86.1 fL (ref 80.0–100.0)
Monocytes Absolute: 0.7 10*3/uL (ref 0.1–1.0)
Monocytes Relative: 4 %
Neutro Abs: 16.1 10*3/uL — ABNORMAL HIGH (ref 1.7–7.7)
Neutrophils Relative %: 87 %
Platelets: 294 10*3/uL (ref 150–400)
RBC: 5.26 MIL/uL — ABNORMAL HIGH (ref 3.87–5.11)
RDW: 14.5 % (ref 11.5–15.5)
WBC: 18.3 10*3/uL — ABNORMAL HIGH (ref 4.0–10.5)
nRBC: 0 % (ref 0.0–0.2)

## 2023-07-10 LAB — MAGNESIUM: Magnesium: 2 mg/dL (ref 1.7–2.4)

## 2023-07-10 LAB — GLUCOSE, CAPILLARY
Glucose-Capillary: 137 mg/dL — ABNORMAL HIGH (ref 70–99)
Glucose-Capillary: 194 mg/dL — ABNORMAL HIGH (ref 70–99)

## 2023-07-10 LAB — HEMOGLOBIN A1C
Hgb A1c MFr Bld: 7.3 % — ABNORMAL HIGH (ref 4.8–5.6)
Mean Plasma Glucose: 162.81 mg/dL

## 2023-07-10 LAB — PHOSPHORUS: Phosphorus: 4.7 mg/dL — ABNORMAL HIGH (ref 2.5–4.6)

## 2023-07-10 MED ORDER — TRAZODONE HCL 50 MG PO TABS: 25.0000 mg | ORAL_TABLET | Freq: Every evening | ORAL | Status: DC | PRN

## 2023-07-10 MED ORDER — ENOXAPARIN SODIUM 40 MG/0.4ML IJ SOSY
40.0000 mg | PREFILLED_SYRINGE | INTRAMUSCULAR | Status: DC
  Filled 2023-07-10: qty 0.4

## 2023-07-10 MED ORDER — LISINOPRIL 10 MG PO TABS
20.0000 mg | ORAL_TABLET | Freq: Every day | ORAL | Status: DC
  Administered 2023-07-10 – 2023-07-12 (×3): 20 mg via ORAL
  Filled 2023-07-10 (×3): qty 2

## 2023-07-10 MED ORDER — SODIUM CHLORIDE 0.9 % IV SOLN: 250.0000 mL | INTRAVENOUS | Status: DC | PRN

## 2023-07-10 MED ORDER — ATENOLOL 25 MG PO TABS
100.0000 mg | ORAL_TABLET | Freq: Every morning | ORAL | Status: DC
  Administered 2023-07-10 – 2023-07-12 (×3): 100 mg via ORAL
  Filled 2023-07-10 (×3): qty 4

## 2023-07-10 MED ORDER — SODIUM CHLORIDE 0.9 % IV SOLN: INTRAVENOUS | Status: AC

## 2023-07-10 MED ORDER — FLEET ENEMA RE ENEM: 1.0000 | ENEMA | Freq: Once | RECTAL | Status: DC | PRN

## 2023-07-10 MED ORDER — LEVALBUTEROL HCL 0.63 MG/3ML IN NEBU: 0.6300 mg | INHALATION_SOLUTION | Freq: Four times a day (QID) | RESPIRATORY_TRACT | Status: DC | PRN

## 2023-07-10 MED ORDER — RISAQUAD PO CAPS
2.0000 | ORAL_CAPSULE | Freq: Three times a day (TID) | ORAL | Status: DC
  Administered 2023-07-10 – 2023-07-12 (×6): 2 via ORAL
  Filled 2023-07-10 (×6): qty 2

## 2023-07-10 MED ORDER — METRONIDAZOLE 500 MG/100ML IV SOLN
500.0000 mg | Freq: Once | INTRAVENOUS | Status: AC
  Administered 2023-07-10: 500 mg via INTRAVENOUS
  Filled 2023-07-10: qty 100

## 2023-07-10 MED ORDER — IOHEXOL 350 MG/ML SOLN
75.0000 mL | Freq: Once | INTRAVENOUS | Status: AC | PRN
  Administered 2023-07-10: 75 mL via INTRAVENOUS

## 2023-07-10 MED ORDER — IPRATROPIUM BROMIDE 0.02 % IN SOLN: 0.5000 mg | Freq: Four times a day (QID) | RESPIRATORY_TRACT | Status: DC | PRN

## 2023-07-10 MED ORDER — LACTATED RINGERS IV BOLUS
500.0000 mL | Freq: Once | INTRAVENOUS | Status: AC
  Administered 2023-07-10: 500 mL via INTRAVENOUS

## 2023-07-10 MED ORDER — HYDRALAZINE HCL 20 MG/ML IJ SOLN: 10.0000 mg | INTRAMUSCULAR | Status: DC | PRN

## 2023-07-10 MED ORDER — CIPROFLOXACIN IN D5W 400 MG/200ML IV SOLN
400.0000 mg | Freq: Two times a day (BID) | INTRAVENOUS | Status: DC
  Administered 2023-07-10 – 2023-07-11 (×2): 400 mg via INTRAVENOUS
  Filled 2023-07-10 (×2): qty 200

## 2023-07-10 MED ORDER — ONDANSETRON HCL 4 MG PO TABS: 4.0000 mg | ORAL_TABLET | Freq: Four times a day (QID) | ORAL | Status: DC | PRN

## 2023-07-10 MED ORDER — ACETAMINOPHEN 650 MG RE SUPP: 650.0000 mg | Freq: Four times a day (QID) | RECTAL | Status: DC | PRN

## 2023-07-10 MED ORDER — SODIUM CHLORIDE 0.9 % IV SOLN: 1.0000 g | INTRAVENOUS | Status: DC

## 2023-07-10 MED ORDER — SODIUM CHLORIDE 0.9% FLUSH: 3.0000 mL | Freq: Two times a day (BID) | INTRAVENOUS | Status: DC

## 2023-07-10 MED ORDER — CLOPIDOGREL BISULFATE 75 MG PO TABS
75.0000 mg | ORAL_TABLET | Freq: Every day | ORAL | Status: DC
  Filled 2023-07-10: qty 1

## 2023-07-10 MED ORDER — SODIUM CHLORIDE 0.9% FLUSH
3.0000 mL | Freq: Two times a day (BID) | INTRAVENOUS | Status: DC
  Administered 2023-07-10 – 2023-07-12 (×4): 3 mL via INTRAVENOUS

## 2023-07-10 MED ORDER — CLOPIDOGREL BISULFATE 75 MG PO TABS: 75.0000 mg | ORAL_TABLET | Freq: Every day | ORAL | Status: DC

## 2023-07-10 MED ORDER — PANTOPRAZOLE SODIUM 40 MG IV SOLR
40.0000 mg | Freq: Two times a day (BID) | INTRAVENOUS | Status: DC
  Administered 2023-07-10 – 2023-07-11 (×4): 40 mg via INTRAVENOUS
  Filled 2023-07-10 (×5): qty 10

## 2023-07-10 MED ORDER — SODIUM CHLORIDE 0.9% FLUSH: 3.0000 mL | INTRAVENOUS | Status: DC | PRN

## 2023-07-10 MED ORDER — OXYCODONE HCL 5 MG PO TABS
5.0000 mg | ORAL_TABLET | ORAL | Status: DC | PRN
  Administered 2023-07-10: 5 mg via ORAL
  Filled 2023-07-10: qty 1

## 2023-07-10 MED ORDER — FENTANYL CITRATE PF 50 MCG/ML IJ SOSY
50.0000 ug | PREFILLED_SYRINGE | INTRAMUSCULAR | Status: AC | PRN
  Administered 2023-07-10 (×2): 50 ug via INTRAVENOUS
  Filled 2023-07-10 (×2): qty 1

## 2023-07-10 MED ORDER — SENNOSIDES-DOCUSATE SODIUM 8.6-50 MG PO TABS: 1.0000 | ORAL_TABLET | Freq: Every evening | ORAL | Status: DC | PRN

## 2023-07-10 MED ORDER — ONDANSETRON HCL 4 MG/2ML IJ SOLN: 4.0000 mg | Freq: Four times a day (QID) | INTRAMUSCULAR | Status: DC | PRN

## 2023-07-10 MED ORDER — CEFTRIAXONE SODIUM 1 G IJ SOLR
1.0000 g | Freq: Once | INTRAMUSCULAR | Status: AC
Start: 2023-07-10 — End: 2023-07-10
  Administered 2023-07-10: 1 g via INTRAVENOUS
  Filled 2023-07-10: qty 10

## 2023-07-10 MED ORDER — BISACODYL 5 MG PO TBEC: 5.0000 mg | DELAYED_RELEASE_TABLET | Freq: Every day | ORAL | Status: DC | PRN

## 2023-07-10 MED ORDER — LEVOTHYROXINE SODIUM 88 MCG PO TABS
88.0000 ug | ORAL_TABLET | Freq: Every day | ORAL | Status: DC
  Administered 2023-07-11 – 2023-07-12 (×2): 88 ug via ORAL
  Filled 2023-07-10 (×2): qty 1

## 2023-07-10 MED ORDER — CYCLOBENZAPRINE HCL 10 MG PO TABS
10.0000 mg | ORAL_TABLET | Freq: Three times a day (TID) | ORAL | Status: DC | PRN
  Administered 2023-07-11 – 2023-07-12 (×2): 10 mg via ORAL
  Filled 2023-07-10 (×2): qty 1

## 2023-07-10 MED ORDER — INSULIN ASPART 100 UNIT/ML IJ SOLN
0.0000 [IU] | Freq: Three times a day (TID) | INTRAMUSCULAR | Status: DC
  Administered 2023-07-11: 1 [IU] via SUBCUTANEOUS

## 2023-07-10 MED ORDER — CITALOPRAM HYDROBROMIDE 20 MG PO TABS
20.0000 mg | ORAL_TABLET | ORAL | Status: DC
  Administered 2023-07-11: 20 mg via ORAL
  Filled 2023-07-10 (×2): qty 1

## 2023-07-10 MED ORDER — LACTATED RINGERS IV BOLUS: 500.0000 mL | Freq: Once | INTRAVENOUS | Status: DC

## 2023-07-10 MED ORDER — ACETAMINOPHEN 325 MG PO TABS
650.0000 mg | ORAL_TABLET | Freq: Four times a day (QID) | ORAL | Status: DC | PRN
  Administered 2023-07-11 – 2023-07-12 (×2): 650 mg via ORAL
  Filled 2023-07-10 (×2): qty 2

## 2023-07-10 MED ORDER — METRONIDAZOLE 500 MG/100ML IV SOLN
500.0000 mg | Freq: Two times a day (BID) | INTRAVENOUS | Status: DC
  Administered 2023-07-10 – 2023-07-11 (×2): 500 mg via INTRAVENOUS
  Filled 2023-07-10 (×2): qty 100

## 2023-07-10 NOTE — Assessment & Plan Note (Signed)
-   Continue home dose Synthroid °

## 2023-07-10 NOTE — ED Provider Notes (Signed)
[facility] Provider Note   CSN: [id number] Arrival date & time: 07/10/23  0741     History  Chief Complaint  Patient presents with   Rectal Bleeding    Brittany Goodman is a 77 y.o. female.  HPI    77 year old female comes in with chief complaint of bloody stools.  Patient has history of diverticulosis, TIA and is on Plavix.  She indicates that she started having bowel movements at 3 AM.  She has had about 4 bloody stools since then, with bright red blood per rectum.  Prior to that, she did not have any abdominal pain yesterday and has not seen any melena or bloody stools.  She has had history of bloody stools in the past secondary to colitis.  Patient describes the abdominal pain as cramping and generalized.   Patient is also Jehovah's Witness and has declined blood transfusion.  Home Medications Prior to Admission medications   Medication Sig Start Date End Date Taking? Authorizing Provider  acetaminophen (TYLENOL) 500 MG tablet Take 500 mg by mouth 2 (two) times daily as needed for mild pain or moderate pain.   Yes [provider]  Ascorbic Acid (VITAMIN C) 1000 MG tablet Take 1,000-2,000 mg by mouth daily.    Yes [provider]  atenolol (TENORMIN) 100 MG tablet Take 100 mg by mouth every morning.   Yes [provider]  Cholecalciferol (VITAMIN D) 125 MCG (5000 UT) CAPS Take 1 capsule by mouth daily.    Yes [provider]  citalopram (CELEXA) 20 MG tablet Take 20 mg by mouth every other day.   Yes [provider]  clopidogrel (PLAVIX) 75 MG tablet Take 75 mg by mouth daily.   Yes [provider]  cyclobenzaprine (FLEXERIL) 10 MG tablet Take 10 mg by mouth 3 (three) times daily as needed for muscle spasms.   Yes [provider]  HYDROcodone-acetaminophen (NORCO/VICODIN) 5-325 MG tablet Take 1 tablet by mouth every 4 (four) hours as needed for moderate pain.  03/10/17   Yes [provider]  levothyroxine (SYNTHROID) 100 MCG tablet Take 88 mcg by mouth daily before breakfast.   Yes [provider]  lisinopril (ZESTRIL) 20 MG tablet Take 20 mg by mouth daily.   Yes [provider]  metFORMIN (GLUCOPHAGE) 1000 MG tablet Take 1,000 mg by mouth daily with breakfast. 12/01/16  Yes [provider]  NONFORMULARY OR COMPOUNDED ITEM Apply 1 application topically 3 (three) times daily as needed (Pain). Diclofenac, Baclofen, Gabapentin, Lidocaine, Menthol - Compounded at [employer] [facility]   Yes [provider]  omeprazole (PRILOSEC) 20 MG capsule Take 20 mg by mouth daily.   Yes [provider]  vitamin B-12 (CYANOCOBALAMIN) 1000 MCG tablet Take 1,000 mcg by mouth daily.   Yes [provider]  folic acid (FOLVITE) 800 MCG tablet Take 800 mcg by mouth daily.     [provider]  simvastatin (ZOCOR) 10 MG tablet Take 1 tablet (10 mg total) by mouth daily at 6 PM for 30 days. Patient not taking: Reported on 07/10/2023 02/16/19 08/07/19  Maurilio Lovely D, DO      Allergies    Celecoxib, Codeine, Dilaudid [hydromorphone hcl], Morphine, Statins, and Tetracycline    Review of Systems   Review of Systems  All other systems reviewed and are negative.   Physical Exam Updated Vital Signs BP (!) 140/88   Pulse 71   Temp 97.8 F (36.6 C) (Oral)  Resp 17   Ht 5\' 4"  (1.626 m)   Wt 77.1 kg   SpO2 95%   BMI 29.18 kg/m  Physical Exam Vitals and nursing note reviewed.  Constitutional:      Appearance: She is well-developed.  HENT:     Head: Atraumatic.  Cardiovascular:     Rate and Rhythm: Normal rate.  Pulmonary:     Effort: Pulmonary effort is normal.  Abdominal:     General: There is no distension.     Palpations: Abdomen is soft.     Tenderness: There is abdominal tenderness. There is no guarding or rebound.     Comments: Generalized abdominal tenderness noted  Musculoskeletal:     Cervical  back: Normal range of motion and neck supple.  Skin:    General: Skin is warm and dry.  Neurological:     Mental Status: She is alert and oriented to person, place, and time.     ED Results / Procedures / Treatments   Labs (all labs ordered are listed, but only abnormal results are displayed) Labs Reviewed  BASIC METABOLIC PANEL - Abnormal; Notable for the following components:      Result Value   Sodium 130 (*)    Chloride 97 (*)    CO2 20 (*)    Glucose, Bld 191 (*)    Creatinine, Ser 1.14 (*)    GFR, Estimated 50 (*)    All other components within normal limits  CBC WITH DIFFERENTIAL/PLATELET - Abnormal; Notable for the following components:   WBC 18.3 (*)    RBC 5.26 (*)    Neutro Abs 16.1 (*)    Abs Immature Granulocytes 0.10 (*)    All other components within normal limits  PROTIME-INR    EKG None  Radiology CT Angio Abd/Pel W and/or Wo Contrast  Result Date: 07/10/2023 CLINICAL DATA:  78 year old female with presumed lower GI bleeding EXAM: CTA ABDOMEN AND PELVIS WITHOUT AND WITH CONTRAST TECHNIQUE: Multidetector CT imaging of the abdomen and pelvis was performed using the standard protocol during bolus administration of intravenous contrast. Multiplanar reconstructed images and MIPs were obtained and reviewed to evaluate the vascular anatomy. RADIATION DOSE REDUCTION: This exam was performed according to the departmental dose-optimization program which includes automated exposure control, adjustment of the mA and/or kV according to patient size and/or use of iterative reconstruction technique. CONTRAST:  75mL OMNIPAQUE IOHEXOL 350 MG/ML SOLN COMPARISON:  09/21/2019 FINDINGS: VASCULAR Aorta: Atherosclerotic changes of the lower thoracic aorta. Diameter at the hiatus estimated 25 mm. Moderate atherosclerosis of the abdominal aorta. No pedunculated plaque or ulcerated plaque. No dissection. No aneurysm. 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 Branches are patent. SMA: Atherosclerotic changes at the origin of the superior mesenteric artery with no CT evidence of high-grade stenosis. Branches are patent. Renals: - Right: Single right renal artery. Mild calcified plaque at the origin without evidence of high-grade stenosis. - Left: Single left renal artery. Calcified and atheromatous plaque at the origin of the left renal artery with greater than 50% stenosis. IMA: Inferior mesenteric artery is patent. Right lower extremity: Mild tortuosity of the right iliac system. Mild atherosclerotic changes of the right iliac system. No aneurysm, dissection, or occlusion. Hypogastric artery is patent. Common femoral artery patent. Proximal SFA and profunda femoris patent. Left lower extremity: Mild tortuosity of the left iliac system. Mild atherosclerotic changes of the left iliac system. No aneurysm, dissection, or occlusion. Hypogastric artery is  patent. Common femoral artery patent. Proximal SFA and profunda femoris patent. Veins: Unremarkable appearance of the venous system. Review of the MIP images confirms the above findings. NON-VASCULAR Lower chest: No acute. Hepatobiliary: Unremarkable appearance of the liver. Unremarkable gallbladder. Gas within the gallbladder lumen as well as the biliary tree in the hilum of the liver. Pancreas: Unremarkable. Spleen: Unremarkable. Adrenals/Urinary Tract: - Right adrenal gland: Unremarkable - Left adrenal gland: Unremarkable. - Right kidney: No hydronephrosis, nephrolithiasis, inflammation, or ureteral dilation. No focal lesion. - Left Kidney: No hydronephrosis, nephrolithiasis, inflammation, or ureteral dilation. No focal lesion. - Urinary Bladder: Unremarkable. Stomach/Bowel: - Stomach: Unremarkable. - Small bowel: Unremarkable - Appendix: Normal. - Colon: No accumulation of contrast within the lumen of the bowel. Circumferential thickening of the colonic wall in the distal transverse  colon, splenic flexure, descending colon, sigmoid colon. Diverticula are present. The late phase imaging demonstrates evidence enhancement of the bowel wall. Lymphatic: No adenopathy. Mesenteric: Stranding within the fat adjacent to the left colon. Trace reactive fluid at the dependent pelvis. Reproductive: Hysterectomy Other: No hernia. Musculoskeletal: Scoliotic curvature of the lumbar spine, apex right most likely degenerative. Multilevel disc disease throughout the visualized thoracolumbar spine with vacuum disc phenomenon throughout lumbar spine. Advanced facet disease of all lumbar levels. No displaced fracture. Degenerative changes of the hips. IMPRESSION: CT angiogram of the abdomen/pelvis negative for evidence of acute gastrointestinal hemorrhage. Acute colitis of the left colon, extending from the distal transverse colon through the sigmoid colon. Given the underlying diverticula, acute diverticulitis would be the leading differential diagnosis, however, other infectious or inflammatory causes could alternatively account for the length of bowel involved. Ischemic is favored not likely as CT demonstrates maintained enhancement. Gas within the biliary system, presumably secondary to prior intervention. 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 Aortic Atherosclerosis (ICD10-I70.0). Additional ancillary findings as above. Signed, Yvone Neu. Miachel Roux, RPVI [facility] Radiology Electronically Signed   By: Gilmer Mor D.O.   On: 07/10/2023 12:08    Procedures Procedures    Medications Ordered in ED Medications  cefTRIAXone (ROCEPHIN) 1 g in sodium chloride 0.9 % 100 mL IVPB (1 g Intravenous New Bag/Given 07/10/23 1304)  metroNIDAZOLE (FLAGYL) IVPB 500 mg  (has no administration in time range)  fentaNYL (SUBLIMAZE) injection 50 mcg (50 mcg Intravenous Given 07/10/23 1204)  iohexol (OMNIPAQUE) 350 MG/ML injection 75 mL (75 mLs Intravenous Contrast Given 07/10/23 1761)    ED Course/ Medical Decision Making/ A&P                                 Medical Decision Making Amount and/or Complexity of Data Reviewed Labs: ordered. Radiology: ordered.  Risk Prescription drug management. Decision regarding hospitalization.   77 year old female comes in with chief complaint of abdominal pain and bloody stools.  Since 3 AM she has had at least 4 episodes of bloody stools.  Patient is Jehovah's Witness, has declined blood transfusion.  Differential diagnosis considered for this patient includes: Colitis, infectious versus ischemic Diverticular bleed Colon cancer Rectal bleed Internal hemorrhoids External hemorrhoids  We will proceed with CT angio abdomen given the brisk nature of the bleed.  1:25 PM I independently interpreted patient's CT scan.  There is no contrast extravasation which is reassuring. At the time of reassessment,  CT read was not completed.  Ultimately, the radiologist calls this a colitis.  She has received antibiotics.  Patient is clinically not septic.  Final Clinical Impression(s) / ED Diagnoses Final diagnoses:  Hematochezia  Colitis    Rx / DC Orders ED Discharge Orders     None         Derwood Kaplan, MD 07/10/23 1326

## 2023-07-10 NOTE — ED Notes (Signed)
Patient bathed, peri care done, and linens and blankets changed.

## 2023-07-10 NOTE — Consult Note (Signed)
Gastroenterology Consult   Referring Provider: [facility]  Primary Care Physician:  Assunta Found, MD Primary Gastroenterologist:  Dr. Jena Gauss   Patient ID: Brittany Goodman; [id number]; Feb 10, 1946   Admit date: 07/10/2023  LOS: 0 days   Date of Consultation: 07/10/2023  Reason for Consultation:  Hematochezia   History of Present Illness   Brittany Goodman is a 77 y.o. year old female with history of prior CVA, DM, diverticulosis, GERD, OSA, sleep apnea, ischemic vs segmental colitis in Jan 2020, presenting this admission with abdominal pain and rectal bleeding on Plavix, with last dose yesterday.   In the ED: WBC count 18.3, Hgb 14.8, INR 1.0, Na 130, Creatinine 1.14.  CTA without acute hemorrhage. Acute colitis of left colon noted, extending from distal transverse through the sigmoid. Underlying diverticula, unable to exclude acute diverticulitis. Ischemic less favores. High-grade celiac artery stenosis noted, IMA patent, SMA with atherosclerotic changes at origin but no evidence of high-grade stenosis.    Son at bedside with patient. She notes taking a probiotic chronically since 2020 but felt full and started to back off. Took Lortab yesterday and Benadryl. At around 0300 this morning, had bloating, gas, "sloshing" in LLQ, diaphoretic, then "broke loose". States large amounts of stool followed by bright red blood per rectum. She had 4 large volume episodes prior to admission and 3 in the ED. Believes it is tapering down and now darker with clots. Abdominal cramping improved. No diarrhea.   Denies any NSAIDs. Takes Tylenol. Feels weak.   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     Last colonoscopy 2019: pancolonic diverticulosis, one 5 mm polyp at hepatic flexure, internal hemorrhoids. (Leiomyoma)   Past Medical History:  Diagnosis Date   Arthritis    CVA (cerebral vascular accident) (HCC) 02/14/2019   Depression    Depression 02/14/2019   Diabetes mellitus type  2, uncontrolled, with complications 02/14/2019   Diabetes mellitus type II    Diverticulosis    GERD (gastroesophageal reflux disease)    History of colitis    Hyperlipidemia    Hypertension    Hypothyroidism    OSA (obstructive sleep apnea) 02/14/2019   Sepsis (HCC)    2011, Escherichia coli pyelonephritis   Sleep apnea    Does not use CPAP. Cannot tolerate   Spinal stenosis    Spinal stenosis 02/14/2019   Stroke [facility])    no deficits    Past Surgical History:  Procedure Laterality Date   BACK SURGERY     CARPAL TUNNEL RELEASE Right    COLONOSCOPY N/A 05/29/2013   Focal left colonic inflammation, likely remnant of recent bout of ischemic or segmental colitis, s/p biopsy. pancolonic diverticulosis. Due for surveillance in 5 years   COLONOSCOPY WITH PROPOFOL N/A 07/26/2018   Procedure: COLONOSCOPY WITH PROPOFOL;  Surgeon: Corbin Ade, MD;  [facility];  Service: Endoscopy;  Laterality: N/A;  8:30am   ESOPHAGOGASTRODUODENOSCOPY (EGD) WITH ESOPHAGEAL DILATION N/A 05/29/2013   normal appearing patent tubular esophagus, small hiatal hernia, multiple antral erosions. no ulcer. normlal duodenum. empiric dilation   ILIOTIBIAL BAND RELEASE Left    NECK SURGERY     POLYPECTOMY  07/26/2018   Procedure: POLYPECTOMY;  Surgeon: Corbin Ade, MD;  Location: [facility];  Service: Endoscopy;;  hepatic flexure polyp   TOTAL ABDOMINAL HYSTERECTOMY      Prior to Admission medications   Medication Sig Start Date End Date Taking? Authorizing Provider  acetaminophen (TYLENOL) 500 MG  tablet Take 500 mg by mouth 2 (two) times daily as needed for mild pain or moderate pain.   Yes [provider]  Ascorbic Acid (VITAMIN C) 1000 MG tablet Take 1,000-2,000 mg by mouth daily.    Yes [provider]  atenolol (TENORMIN) 100 MG tablet Take 100 mg by mouth every morning.   Yes [provider]  Cholecalciferol (VITAMIN D) 125 MCG (5000 UT) CAPS Take 1 capsule by mouth  daily.    Yes [provider]  citalopram (CELEXA) 20 MG tablet Take 20 mg by mouth every other day.   Yes [provider]  clopidogrel (PLAVIX) 75 MG tablet Take 75 mg by mouth daily.   Yes [provider]  cyclobenzaprine (FLEXERIL) 10 MG tablet Take 10 mg by mouth 3 (three) times daily as needed for muscle spasms.   Yes [provider]  HYDROcodone-acetaminophen (NORCO/VICODIN) 5-325 MG tablet Take 1 tablet by mouth every 4 (four) hours as needed for moderate pain.  03/10/17  Yes [provider]  levothyroxine (SYNTHROID) 100 MCG tablet Take 88 mcg by mouth daily before breakfast.   Yes [provider]  lisinopril (ZESTRIL) 20 MG tablet Take 20 mg by mouth daily.   Yes [provider]  metFORMIN (GLUCOPHAGE) 1000 MG tablet Take 1,000 mg by mouth daily with breakfast. 12/01/16  Yes [provider]  NONFORMULARY OR COMPOUNDED ITEM Apply 1 application topically 3 (three) times daily as needed (Pain). Diclofenac, Baclofen, Gabapentin, Lidocaine, Menthol - Compounded at [facility]   Yes [provider]  omeprazole (PRILOSEC) 20 MG capsule Take 20 mg by mouth daily.   Yes [provider]  vitamin B-12 (CYANOCOBALAMIN) 1000 MCG tablet Take 1,000 mcg by mouth daily.   Yes [provider]  folic acid (FOLVITE) 800 MCG tablet Take 800 mcg by mouth daily.     [provider]  simvastatin (ZOCOR) 10 MG tablet Take 1 tablet (10 mg total) by mouth daily at 6 PM for 30 days. Patient not taking: Reported on 07/10/2023 02/16/19 08/07/19  Maurilio Lovely D, DO    No current facility-administered medications for this encounter.   Current Outpatient Medications  Medication Sig Dispense Refill   acetaminophen (TYLENOL) 500 MG tablet Take 500 mg by mouth 2 (two) times daily as needed for mild pain or moderate pain.     Ascorbic Acid (VITAMIN C) 1000 MG tablet Take 1,000-2,000 mg by mouth daily.       atenolol (TENORMIN) 100 MG tablet Take 100 mg by mouth every morning.     Cholecalciferol (VITAMIN D) 125 MCG (5000 UT) CAPS Take 1 capsule by mouth daily.      citalopram (CELEXA) 20 MG tablet Take 20 mg by mouth every other day.     clopidogrel (PLAVIX) 75 MG tablet Take 75 mg by mouth daily.     cyclobenzaprine (FLEXERIL) 10 MG tablet Take 10 mg by mouth 3 (three) times daily as needed for muscle spasms.     HYDROcodone-acetaminophen (NORCO/VICODIN) 5-325 MG tablet Take 1 tablet by mouth every 4 (four) hours as needed for moderate pain.   0   levothyroxine (SYNTHROID) 100 MCG tablet Take 88 mcg by mouth daily before breakfast.     lisinopril (ZESTRIL) 20 MG tablet Take 20 mg by mouth daily.     metFORMIN (GLUCOPHAGE) 1000 MG tablet Take 1,000 mg by mouth daily with breakfast.     NONFORMULARY OR COMPOUNDED ITEM Apply 1 application topically 3 (three) times  daily as needed (Pain). Diclofenac, Baclofen, Gabapentin, Lidocaine, Menthol - Compounded at [facility]     omeprazole (PRILOSEC) 20 MG capsule Take 20 mg by mouth daily.     vitamin B-12 (CYANOCOBALAMIN) 1000 MCG tablet Take 1,000 mcg by mouth daily.     folic acid (FOLVITE) 800 MCG tablet Take 800 mcg by mouth daily.      simvastatin (ZOCOR) 10 MG tablet Take 1 tablet (10 mg total) by mouth daily at 6 PM for 30 days. (Patient not taking: Reported on 07/10/2023) 30 tablet 2    Allergies as of 07/10/2023 - Review Complete 07/10/2023  Allergen Reaction Noted   Celecoxib Swelling 11/28/2018   Codeine Other (See Comments) 08/04/2009   Dilaudid [hydromorphone hcl] Other (See Comments) 05/06/2013   Morphine Nausea And Vomiting 08/04/2009   Statins  11/28/2018   Tetracycline Nausea And Vomiting 08/04/2009    Family History  Problem Relation Age of Onset   Heart disease Mother        MI   Heart disease Father    Heart attack Father 34   Heart disease Brother    Cancer Brother        ADRENAL GLAND   Colon polyps Brother     Liver cancer Brother 10       deceased , ?metastatic cancer?    Heart disease Maternal Grandfather        MI   Cancer Paternal Grandfather        LUNG   Colon cancer Neg Hx     Social History   Socioeconomic History   Marital status: Widowed    Spouse name: Not on file   Number of children: 2   Years of education: GED   Highest education level: Not on file  Occupational History   Occupation: Retired    Comment: [employer](  Tobacco Use   Smoking status: Former    Current packs/day: 0.00    Average packs/day: 2.0 packs/day for 13.0 years (26.0 ttl pk-yrs)    Types: Cigarettes    Start date: 07/19/1953    Quit date: 07/19/1966    Years since quitting: 57.0   Smokeless tobacco: Former    Types: Associate Professor status: Not on file  Substance and Sexual Activity   Alcohol use: No   Drug use: No   Sexual activity: Not Currently    Birth control/protection: Surgical  Other Topics Concern   Not on file  Social History Narrative   Lives with son   Caffeine use: Coffee daily   Right handed   No regular exercise   Social Determinants of Health   Financial Resource Strain: Not on file  Food Insecurity: Not on file  Transportation Needs: Not on file  Physical Activity: Not on file  Stress: Not on file  Social Connections: Not on file  Intimate Partner Violence: Not on file     Review of Systems   Gen: Denies any fever, chills, loss of appetite, change in weight or weight loss CV: Denies chest pain, heart palpitations, syncope, edema  Resp: Denies shortness of breath with rest, cough, wheezing, coughing up blood, and pleurisy. GI: see HPI GU : Denies urinary burning, blood in urine, urinary frequency, and urinary incontinence. MS: Denies joint pain, limitation of movement, swelling, cramps, and atrophy.  Derm: Denies rash, itching, dry skin, hives. Psych: Denies depression, anxiety, memory loss, hallucinations, and confusion. Heme: Denies  bruising or bleeding Neuro:  Denies any  headaches, dizziness, paresthesias, shaking  Physical Exam   Vital Signs in last 24 hours: Temp:  [97.3 F (36.3 C)-98 F (36.7 C)] 97.8 F (36.6 C) (08/19 1202) Pulse Rate:  [66-72] 71 (08/19 1115) Resp:  [16-17] 17 (08/19 1115) BP: (102-143)/(66-88) 140/88 (08/19 1115) SpO2:  [92 %-97 %] 95 % (08/19 1115) Weight:  [77.1 kg] 77.1 kg (08/19 0810)    General:   Alert,  Well-developed, well-nourished, pleasant and cooperative in NAD Head:  Normocephalic and atraumatic. Eyes:  Sclera clear, no icterus.    Ears:  Normal auditory acuity. Lungs:  Clear throughout to auscultation.    Heart:  S1 S2 present Abdomen:  Soft, mild TTP LUQ and LLQ and nondistended. No masses, hepatosplenomegaly or hernias noted. Normal bowel sounds, without guarding, and without rebound.   Rectal: deferred   Msk:  Symmetrical without gross deformities. Normal posture. Extremities:  Without edema. Neurologic:  Alert and  oriented x4. Skin:  Intact without significant lesions or rashes. Psych:  Alert and cooperative. Normal mood and affect.  Intake/Output from previous day: No intake/output data recorded. Intake/Output this shift: No intake/output data recorded.   Labs/Studies   Recent Labs Recent Labs    07/10/23 0816  WBC 18.3*  HGB 14.8  HCT 45.3  PLT 294   BMET Recent Labs    07/10/23 0816  NA 130*  K 4.2  CL 97*  CO2 20*  GLUCOSE 191*  BUN 19  CREATININE 1.14*  CALCIUM 9.5   PT/INR Recent Labs    07/10/23 0816  LABPROT 13.6  INR 1.0     Radiology/Studies CT Angio Abd/Pel W and/or Wo Contrast  Result Date: 07/10/2023 CLINICAL DATA:  77 year old female with presumed lower GI bleeding EXAM: CTA ABDOMEN AND PELVIS WITHOUT AND WITH CONTRAST TECHNIQUE: Multidetector CT imaging of the abdomen and pelvis was performed using the standard protocol during bolus administration of intravenous contrast. Multiplanar reconstructed images and MIPs  were obtained and reviewed to evaluate the vascular anatomy. RADIATION DOSE REDUCTION: This exam was performed according to the departmental dose-optimization program which includes automated exposure control, adjustment of the mA and/or kV according to patient size and/or use of iterative reconstruction technique. CONTRAST:  75mL OMNIPAQUE IOHEXOL 350 MG/ML SOLN COMPARISON:  09/21/2019 FINDINGS: VASCULAR Aorta: Atherosclerotic changes of the lower thoracic aorta. Diameter at the hiatus estimated 25 mm. Moderate atherosclerosis of the abdominal aorta. No pedunculated plaque or ulcerated plaque. No dissection. No aneurysm. 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 Branches are patent. SMA: Atherosclerotic changes at the origin of the superior mesenteric artery with no CT evidence of high-grade stenosis. Branches are patent. Renals: - Right: Single right renal artery. Mild calcified plaque at the origin without evidence of high-grade stenosis. - Left: Single left renal artery. Calcified and atheromatous plaque at the origin of the left renal artery with greater than 50% stenosis. IMA: Inferior mesenteric artery is patent. Right lower extremity: Mild tortuosity of the right iliac system. Mild atherosclerotic changes of the right iliac system. No aneurysm, dissection, or occlusion. Hypogastric artery is patent. Common femoral artery patent. Proximal SFA and profunda femoris patent. Left lower extremity: Mild tortuosity of the left iliac system. Mild atherosclerotic changes of the left iliac system. No aneurysm, dissection, or occlusion. Hypogastric artery is patent. Common femoral artery patent. Proximal SFA and profunda femoris patent. Veins: Unremarkable appearance of the venous system. Review of the MIP images confirms the above findings. NON-VASCULAR Lower chest:  No acute. Hepatobiliary: Unremarkable appearance of the liver. Unremarkable gallbladder.  Gas within the gallbladder lumen as well as the biliary tree in the hilum of the liver. Pancreas: Unremarkable. Spleen: Unremarkable. Adrenals/Urinary Tract: - Right adrenal gland: Unremarkable - Left adrenal gland: Unremarkable. - Right kidney: No hydronephrosis, nephrolithiasis, inflammation, or ureteral dilation. No focal lesion. - Left Kidney: No hydronephrosis, nephrolithiasis, inflammation, or ureteral dilation. No focal lesion. - Urinary Bladder: Unremarkable. Stomach/Bowel: - Stomach: Unremarkable. - Small bowel: Unremarkable - Appendix: Normal. - Colon: No accumulation of contrast within the lumen of the bowel. Circumferential thickening of the colonic wall in the distal transverse colon, splenic flexure, descending colon, sigmoid colon. Diverticula are present. The late phase imaging demonstrates evidence enhancement of the bowel wall. Lymphatic: No adenopathy. Mesenteric: Stranding within the fat adjacent to the left colon. Trace reactive fluid at the dependent pelvis. Reproductive: Hysterectomy Other: No hernia. Musculoskeletal: Scoliotic curvature of the lumbar spine, apex right most likely degenerative. Multilevel disc disease throughout the visualized thoracolumbar spine with vacuum disc phenomenon throughout lumbar spine. Advanced facet disease of all lumbar levels. No displaced fracture. Degenerative changes of the hips. IMPRESSION: CT angiogram of the abdomen/pelvis negative for evidence of acute gastrointestinal hemorrhage. Acute colitis of the left colon, extending from the distal transverse colon through the sigmoid colon. Given the underlying diverticula, acute diverticulitis would be the leading differential diagnosis, however, other infectious or inflammatory causes could alternatively account for the length of bowel involved. Ischemic is favored not likely as CT demonstrates maintained enhancement. Gas within the biliary system, presumably secondary to prior intervention. 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 Aortic Atherosclerosis (ICD10-I70.0). Additional ancillary findings as above. Signed, Yvone Neu. Miachel Roux, RPVI [facility] Electronically Signed   By: Gilmer Mor D.O.   On: 07/10/2023 12:08     Assessment   Arlina DEREON Goodman is a 77 y.o. year old female with history of prior CVA, DM, diverticulosis, GERD, OSA, sleep apnea, ischemic vs segmental colitis in Jan 2020, presenting this admission with abdominal pain and rectal bleeding on Plavix, with last dose yesterday.   Symptoms seem most consistent with an ischemic presentation but unable to rule out other etiologies including infectious. Bleeding seems to have tapered with each episode. Hgb 14.8 on admission. Notably, CT with underlying diverticula, unable to exclude acute diverticulitis, ischemic less favored. High-grade celiac artery, IMA patent, SMA with atherosclerotic changes at origin but no evidence of high-grade stenosis. Notably, she denies any chronic symptoms of postprandial abdominal pain, food aversions, weight loss, etc.   Recommend supportive measures, IV antibiotics, and stool studies if diarrhea. Colonoscopy last in 2019 with pancolonic diverticulosis, one 5 mm polyp (leiomyoma).   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     Plan / Recommendations    Soft diet Continue empiric antibiotics Follow H/H Stool studies if diarrhea     07/10/2023, 12:45 PM  Brittany Mink, PhD, [facility] Gastroenterology

## 2023-07-10 NOTE — Assessment & Plan Note (Signed)
-   Stable continue home medication including Celexa

## 2023-07-10 NOTE — Assessment & Plan Note (Signed)
-  Will continue statins

## 2023-07-10 NOTE — ED Notes (Signed)
Escorted pt to restroom. Pt passed a considerable amount of blood through her rectum. Blood was red, but not bright red. Blood consisted of what appeared to be small clots.

## 2023-07-10 NOTE — Assessment & Plan Note (Signed)
-   Patient and Protonix I.V. 40 twice daily

## 2023-07-10 NOTE — ED Notes (Signed)
Pt. taken to restroom; attempted to collect a urine sample but sample was contaminated with a moderate amount of very loose, bright red bloody stool (approx. 4 cm diameter).

## 2023-07-10 NOTE — Assessment & Plan Note (Signed)
-  Due to diarrhea Continue IV fluids saline, monitoring closely

## 2023-07-10 NOTE — Assessment & Plan Note (Addendum)
-  Much improved, hemodynamically stable Afebrile, normotensive, WBC 18.3 >>> 9.7  -Consulted gastroenterology -following -Continue IV fluids, -In ED was given Flagyl/Rocephin, will switch to IV Cipro/Flagyl -Continue as needed IV analgesics -Advancing diet per GI

## 2023-07-10 NOTE — Hospital Course (Signed)
Brittany Goodman is a 77 year old female past medical history significant for TIAs on Plavix, HTN, HLD, vitamin D patient C, DM2, GERD, history of colitis in the past. Presenting with chief complaint of bloody stools.  Patient history of diverticulitis,  Reported her symptoms started early this morning about 3 AM she has had about 4 bloody stool since bright red per rectum.  History also reported of dark stool.   ED elevation/course: Blood pressure (!) 140/88, pulse 71, temperature 97.8 F (36.6 C), RR17, height 5\' 4"  (1.626 m), weight 77.1 kg, SpO2 95%.  Labs: CBC 18.3, hemoglobin 14.8, sodium 130, chloride 97, CO2 20, creatinine 1.14, INR 1.0, glucose 191,  CT angio abdomen/pelvis: IMPRESSION: CT angiogram of the abdomen/pelvis negative for evidence of acute gastrointestinal hemorrhage.   Acute colitis of the left colon, extending from the distal transverse colon through the sigmoid colon. Given the underlying diverticula, acute diverticulitis would be the leading differential diagnosis, however, other infectious or inflammatory causes could alternatively account for the length of bowel involved. Ischemic is favored not likely as CT demonstrates maintained enhancement.   Gas within the biliary system, presumably secondary to prior intervention.   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 Aortic Atherosclerosis  ED provider asked for patient to be admitted for colitis,  IV antibiotic initiated, GI consulted

## 2023-07-10 NOTE — H&P (Signed)
History and Physical   Patient: Brittany Goodman                            PCP: Assunta Found, MD                    DOB: 1946-09-26            DOA: 07/10/2023 ZOX:[id number]             DOS: 07/10/2023, 2:18 PM  Assunta Found, MD  Patient coming from:   HOME  I have personally reviewed patient's medical records, in electronic medical records, including:  [place] [facility], and care everywhere.    Chief Complaint:   Chief Complaint  Patient presents with   Rectal Bleeding    History of present illness:    Brittany Goodman is a 77 year old female past medical history significant for TIAs on Plavix, HTN, HLD, vitamin D patient C, DM2, GERD, history of colitis in the past. Presenting with chief complaint of bloody stools.  Patient history of diverticulitis,  Reported her symptoms started early this morning about 3 AM she has had about 4 bloody stool since bright red per rectum.  History also reported of dark stool.   ED elevation/course: Blood pressure (!) 140/88, pulse 71, temperature 97.8 F (36.6 C), RR17, height 5\' 4"  (1.626 m), weight 77.1 kg, SpO2 95%.  Labs: CBC 18.3, hemoglobin 14.8, sodium 130, chloride 97, CO2 20, creatinine 1.14, INR 1.0, glucose 191,  CT angio abdomen/pelvis: IMPRESSION: CT angiogram of the abdomen/pelvis negative for evidence of acute gastrointestinal hemorrhage.   Acute colitis of the left colon, extending from the distal transverse colon through the sigmoid colon. Given the underlying diverticula, acute diverticulitis would be the leading differential diagnosis, however, other infectious or inflammatory causes could alternatively account for the length of bowel involved. Ischemic is favored not likely as CT demonstrates maintained enhancement.   Gas within the biliary system, presumably secondary to prior intervention.   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 Aortic Atherosclerosis  ED provider asked for patient to be admitted for colitis,  IV antibiotic initiated, GI consulted       Patient Denies having: Fever, Chills, Cough, SOB, Chest Pain, Abd pain, N/V  headache, dizziness, lightheadedness,  Dysuria, Joint pain, rash, open wounds    Review of Systems: As per HPI, otherwise 10 point review of systems were negative.   ----------------------------------------------------------------------------------------------------------------------  Allergies  Allergen Reactions   Celecoxib Swelling    Pt states she can tolerate ibuprofen   Codeine Other (See Comments)    "makes me feel bad"   Dilaudid [Hydromorphone Hcl] Other (See Comments)    hallucination   Morphine Nausea And Vomiting   Statins     Muscle pain   Tetracycline Nausea And Vomiting    Home MEDs:  Prior to Admission medications   Medication Sig Start Date End Date Taking? Authorizing Provider  acetaminophen (TYLENOL) 500 MG tablet Take 500 mg by mouth 2 (two) times daily as needed for mild pain or moderate pain.   Yes [provider]  Ascorbic Acid (VITAMIN C) 1000 MG tablet Take 1,000-2,000 mg by mouth daily.    Yes [provider]  atenolol (TENORMIN) 100 MG tablet Take 100 mg by mouth every morning.   Yes  [provider]  Cholecalciferol (VITAMIN D) 125 MCG (5000 UT) CAPS Take 1 capsule by mouth daily.    Yes [provider]  citalopram (CELEXA) 20 MG tablet Take 20 mg by mouth every other day.   Yes [provider]  clopidogrel (PLAVIX) 75 MG tablet Take 75 mg by mouth daily.   Yes [provider]  cyclobenzaprine (FLEXERIL) 10 MG tablet Take 10 mg by mouth 3 (three) times daily as needed for muscle spasms.   Yes [provider]  HYDROcodone-acetaminophen (NORCO/VICODIN) 5-325 MG tablet Take 1  tablet by mouth every 4 (four) hours as needed for moderate pain.  03/10/17  Yes [provider]  levothyroxine (SYNTHROID) 100 MCG tablet Take 88 mcg by mouth daily before breakfast.   Yes [provider]  lisinopril (ZESTRIL) 20 MG tablet Take 20 mg by mouth daily.   Yes [provider]  metFORMIN (GLUCOPHAGE) 1000 MG tablet Take 1,000 mg by mouth daily with breakfast. 12/01/16  Yes [provider]  NONFORMULARY OR COMPOUNDED ITEM Apply 1 application topically 3 (three) times daily as needed (Pain). Diclofenac, Baclofen, Gabapentin, Lidocaine, Menthol - Compounded at [facility]   Yes [provider]  omeprazole (PRILOSEC) 20 MG capsule Take 20 mg by mouth daily.   Yes [provider]  vitamin B-12 (CYANOCOBALAMIN) 1000 MCG tablet Take 1,000 mcg by mouth daily.   Yes [provider]  folic acid (FOLVITE) 800 MCG tablet Take 800 mcg by mouth daily.     [provider]  simvastatin (ZOCOR) 10 MG tablet Take 1 tablet (10 mg total) by mouth daily at 6 PM for 30 days. Patient not taking: Reported on 07/10/2023 02/16/19 08/07/19  Maurilio Lovely D, DO    PRN MEDs: acetaminophen **OR** acetaminophen, cyclobenzaprine, hydrALAZINE, ipratropium, levalbuterol, ondansetron **OR** ondansetron (ZOFRAN) IV, oxyCODONE, traZODone  Past Medical History:  Diagnosis Date   Arthritis    CVA (cerebral vascular accident) (HCC) 02/14/2019   Depression    Depression 02/14/2019   Diabetes mellitus type 2, uncontrolled, with complications 02/14/2019   Diabetes mellitus type II    Diverticulosis    GERD (gastroesophageal reflux disease)    History of colitis    Hyperlipidemia    Hypertension    Hypothyroidism    OSA (obstructive sleep apnea) 02/14/2019   Sepsis (HCC)    2011, Escherichia coli pyelonephritis   Sleep apnea    Does not use CPAP. Cannot tolerate   Spinal stenosis    Spinal stenosis 02/14/2019   Stroke [facility])    no deficits     Past Surgical History:  Procedure Laterality Date   BACK SURGERY     CARPAL TUNNEL RELEASE Right    COLONOSCOPY N/A 05/29/2013   Focal left colonic inflammation, likely remnant of recent bout of ischemic or segmental colitis, s/p biopsy. pancolonic diverticulosis. Due for surveillance in 5 years   COLONOSCOPY WITH PROPOFOL N/A 07/26/2018   Procedure: COLONOSCOPY WITH PROPOFOL;  Surgeon: Corbin Ade, MD;  [facility];  Service: Endoscopy;  Laterality: N/A;  8:30am   ESOPHAGOGASTRODUODENOSCOPY (EGD) WITH ESOPHAGEAL DILATION N/A 05/29/2013   normal appearing patent tubular esophagus, small hiatal hernia, multiple antral erosions. no ulcer. normlal duodenum. empiric dilation   ILIOTIBIAL BAND RELEASE Left    NECK SURGERY     POLYPECTOMY  07/26/2018   Procedure: POLYPECTOMY;  Surgeon: Corbin Ade, MD;  Location: [facility];  Service: Endoscopy;;  hepatic flexure polyp   TOTAL ABDOMINAL HYSTERECTOMY  reports that she quit smoking about 57 years ago. She started smoking about 70 years ago. She has a 26 pack-year smoking history. She has quit using smokeless tobacco.  Her smokeless tobacco use included chew. She reports that she does not drink alcohol and does not use drugs.   Family History  Problem Relation Age of Onset   Heart disease Mother        MI   Heart disease Father    Heart attack Father 28   Heart disease Brother    Cancer Brother        ADRENAL GLAND   Colon polyps Brother    Liver cancer Brother 65       deceased , ?metastatic cancer?    Heart disease Maternal Grandfather        MI   Cancer Paternal Grandfather        LUNG   Colon cancer Neg Hx     Physical Exam:   Vitals:   07/10/23 0915 07/10/23 0930 07/10/23 1115 07/10/23 1202  BP: 123/70 102/66 (!) 140/88   Pulse: 68 66 71   Resp: 16 16 17    Temp:    97.8 F (36.6 C)  TempSrc:    Oral  SpO2: 95% 92% 95%   Weight:      Height:       Constitutional: NAD, calm, comfortable Eyes:  PERRL, lids and conjunctivae normal ENMT: Mucous membranes are moist. Posterior pharynx clear of any exudate or lesions.Normal dentition.  Neck: normal, supple, no masses, no thyromegaly Respiratory: clear to auscultation bilaterally, no wheezing, no crackles. Normal respiratory effort. No accessory muscle use.  Cardiovascular: Regular rate and rhythm, no murmurs / rubs / gallops. No extremity edema. 2+ pedal pulses. No carotid bruits.  Abdomen: no tenderness, no masses palpated. No hepatosplenomegaly. Bowel sounds positive.  Musculoskeletal: no clubbing / cyanosis. No joint deformity upper and lower extremities. Good ROM, no contractures. Normal muscle tone.  Neurologic: CN II-XII grossly intact. Sensation intact, DTR normal. Strength 5/5 in all 4.  Psychiatric: Normal judgment and insight. Alert and oriented x 3. Normal mood.  Skin: no rashes, lesions, ulcers. No induration          Labs on admission:    I have personally reviewed following labs and imaging studies  CBC: Recent Labs  Lab 07/10/23 0816  WBC 18.3*  NEUTROABS 16.1*  HGB 14.8  HCT 45.3  MCV 86.1  PLT 294   Basic Metabolic Panel: Recent Labs  Lab 07/10/23 0816  NA 130*  K 4.2  CL 97*  CO2 20*  GLUCOSE 191*  BUN 19  CREATININE 1.14*  CALCIUM 9.5    Coagulation Profile: Recent Labs  Lab 07/10/23 0816  INR 1.0    Urine analysis:    Component Value Date/Time   COLORURINE YELLOW 09/21/2019 1207   APPEARANCEUR CLEAR 09/21/2019 1207   LABSPEC 1.018 09/21/2019 1207   PHURINE 6.0 09/21/2019 1207   GLUCOSEU NEGATIVE 09/21/2019 1207   HGBUR NEGATIVE 09/21/2019 1207   BILIRUBINUR NEGATIVE 09/21/2019 1207   KETONESUR NEGATIVE 09/21/2019 1207   PROTEINUR NEGATIVE 09/21/2019 1207   UROBILINOGEN 0.2 05/27/2010 0005   NITRITE NEGATIVE 09/21/2019 1207   LEUKOCYTESUR NEGATIVE 09/21/2019 1207    Last A1C:  Lab Results  Component Value Date   HGBA1C 5.9 (H) 02/15/2019     Radiologic Exams on  Admission:   CT Angio Abd/Pel W and/or Wo Contrast  Result Date: 07/10/2023 CLINICAL DATA:  77 year old female with presumed lower  GI bleeding EXAM: CTA ABDOMEN AND PELVIS WITHOUT AND WITH CONTRAST TECHNIQUE: Multidetector CT imaging of the abdomen and pelvis was performed using the standard protocol during bolus administration of intravenous contrast. Multiplanar reconstructed images and MIPs were obtained and reviewed to evaluate the vascular anatomy. RADIATION DOSE REDUCTION: This exam was performed according to the departmental dose-optimization program which includes automated exposure control, adjustment of the mA and/or kV according to patient size and/or use of iterative reconstruction technique. CONTRAST:  75mL OMNIPAQUE IOHEXOL 350 MG/ML SOLN COMPARISON:  09/21/2019 FINDINGS: VASCULAR Aorta: Atherosclerotic changes of the lower thoracic aorta. Diameter at the hiatus estimated 25 mm. Moderate atherosclerosis of the abdominal aorta. No pedunculated plaque or ulcerated plaque. No dissection. No aneurysm. 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 Branches are patent. SMA: Atherosclerotic changes at the origin of the superior mesenteric artery with no CT evidence of high-grade stenosis. Branches are patent. Renals: - Right: Single right renal artery. Mild calcified plaque at the origin without evidence of high-grade stenosis. - Left: Single left renal artery. Calcified and atheromatous plaque at the origin of the left renal artery with greater than 50% stenosis. IMA: Inferior mesenteric artery is patent. Right lower extremity: Mild tortuosity of the right iliac system. Mild atherosclerotic changes of the right iliac system. No aneurysm, dissection, or occlusion. Hypogastric artery is patent. Common femoral artery patent. Proximal SFA and profunda femoris patent. Left lower extremity: Mild tortuosity of the left iliac system. Mild  atherosclerotic changes of the left iliac system. No aneurysm, dissection, or occlusion. Hypogastric artery is patent. Common femoral artery patent. Proximal SFA and profunda femoris patent. Veins: Unremarkable appearance of the venous system. Review of the MIP images confirms the above findings. NON-VASCULAR Lower chest: No acute. Hepatobiliary: Unremarkable appearance of the liver. Unremarkable gallbladder. Gas within the gallbladder lumen as well as the biliary tree in the hilum of the liver. Pancreas: Unremarkable. Spleen: Unremarkable. Adrenals/Urinary Tract: - Right adrenal gland: Unremarkable - Left adrenal gland: Unremarkable. - Right kidney: No hydronephrosis, nephrolithiasis, inflammation, or ureteral dilation. No focal lesion. - Left Kidney: No hydronephrosis, nephrolithiasis, inflammation, or ureteral dilation. No focal lesion. - Urinary Bladder: Unremarkable. Stomach/Bowel: - Stomach: Unremarkable. - Small bowel: Unremarkable - Appendix: Normal. - Colon: No accumulation of contrast within the lumen of the bowel. Circumferential thickening of the colonic wall in the distal transverse colon, splenic flexure, descending colon, sigmoid colon. Diverticula are present. The late phase imaging demonstrates evidence enhancement of the bowel wall. Lymphatic: No adenopathy. Mesenteric: Stranding within the fat adjacent to the left colon. Trace reactive fluid at the dependent pelvis. Reproductive: Hysterectomy Other: No hernia. Musculoskeletal: Scoliotic curvature of the lumbar spine, apex right most likely degenerative. Multilevel disc disease throughout the visualized thoracolumbar spine with vacuum disc phenomenon throughout lumbar spine. Advanced facet disease of all lumbar levels. No displaced fracture. Degenerative changes of the hips. IMPRESSION: CT angiogram of the abdomen/pelvis negative for evidence of acute gastrointestinal hemorrhage. Acute colitis of the left colon, extending from the distal transverse  colon through the sigmoid colon. Given the underlying diverticula, acute diverticulitis would be the leading differential diagnosis, however, other infectious or inflammatory causes could alternatively account for the length of bowel involved. Ischemic is favored not likely as CT demonstrates maintained enhancement. Gas within the biliary system, presumably secondary to prior intervention. 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 Aortic Atherosclerosis (ICD10-I70.0). Additional ancillary findings as above. Signed, Yvone Neu. Miachel Roux, RPVI [facility] Radiology Electronically Signed   By: Gilmer Mor D.O.   On: 07/10/2023 12:08    EKG:   Independently reviewed.  Orders placed or performed during the hospital encounter of 07/10/23   EKG 12-Lead   ---------------------------------------------------------------------------------------------------------------------------------------    Assessment / Plan:   Principal Problem:   Pancolitis (HCC) Active Problems:   Cerebrovascular disease   Abdominal pain, left lower quadrant   Diabetes mellitus type 2, noninsulin dependent (HCC)   Mixed hyperlipidemia   CVA (cerebral vascular accident) (HCC)   Hypothyroidism   HYPERTENSION, BENIGN ESSENTIAL   GERD (gastroesophageal reflux disease)   Hyponatremia   Diarrhea   Depression   Assessment and Plan: * Pancolitis (HCC) - Admitting to MedSurg Afebrile, normotensive, WBC 18.3 -Consulted gastroenterology for evaluation and recommendation -Continue IV fluids, -In ED was given Flagyl/Rocephin, will switch to IV Cipro/Flagyl -Continue as needed IV analgesics -N.p.o.    Diabetes mellitus type 2, noninsulin dependent (HCC) -Currently patient is  n.p.o. -Checking blood sugars every 4-6 hours then ACHS with SSI coverage -Holding home medication of metformin  Abdominal pain, left lower quadrant - Due to pancolitis -N.p.o. -Continue as needed IV analgesics  Cerebrovascular disease -History of TIAs no record of CVA -On statins, Plavix, holding Plavix for GI bleed  Hypothyroidism -Continue home dose Synthroid  CVA (cerebral vascular accident) (HCC) -History of TIAs no record of CVA -On statins, Plavix, holding Plavix for GI bleed  Mixed hyperlipidemia -Will continue statins  Depression - Stable continue home medication including Celexa  Diarrhea Bloody diarrhea likely due to colitis -Obtaining stool study, cultures -Initiated on IV antibiotics in ED Rocephin/Flagyl, will continue with Cipro and Flagyl -Obtaining stool studies, including C. difficile  Hyponatremia -Due to diarrhea Continue IV fluids saline, monitoring closely  GERD (gastroesophageal reflux disease) - Patient and Protonix I.V. 40 twice daily  HYPERTENSION, BENIGN ESSENTIAL - Monitoring BP closely, continue home medication atenolol, lisinopril,              Consults called:   GI -------------------------------------------------------------------------------------------------------------------------------------------- DVT prophylaxis:  enoxaparin (LOVENOX) injection 40 mg Start: 07/10/23 1600 TED hose Start: 07/10/23 1327 SCDs Start: 07/10/23 1327   Code Status:   Code Status: Full Code   Admission status: Patient will be admitted as Inpatient, with a greater than 2 midnight length of stay. Level of care: Med-Surg   Family Communication:  none at bedside  (The above findings and plan of care has been discussed with patient in detail, the patient expressed understanding and agreement of above plan)   --------------------------------------------------------------------------------------------------------------------------------------------------  Disposition Plan:  Anticipated 1-2 days Status is: Inpatient Remains inpatient appropriate because: Evaluation for bloody diarrhea, colitis needing IV antibiotics     ----------------------------------------------------------------------------------------------------------------------------------------------------  Time spent:  69  Min.  Was spent seeing and evaluating the patient, reviewing all medical records, drawn plan of care.  SIGNED: Kendell Bane, MD, FHM. FAAFP. [place] [facility], Pager  (Please use [email or web address] to page/ or secure chat through epic) If 7PM-7AM, please contact night-coverage [email or web address],  07/10/2023, 2:18 PM

## 2023-07-10 NOTE — ED Triage Notes (Addendum)
Pt c/o bright red rectal bleeding, lower abdominal pain, and generalized weakness that started during the night. Hx of diverticulitis. Felt nauseated during the night, but none now. Pt takes a blood thinner due to hx of 2 TIAs 5-6 years ago. Pt also c/o headache x 2 weeks.

## 2023-07-10 NOTE — Assessment & Plan Note (Signed)
-  Tolerating p.o., -Checking CBG q. ACHS, with SSI coverage -Holding home medication of Metformin

## 2023-07-10 NOTE — Assessment & Plan Note (Signed)
-   Due to pancolitis -Per GI starting clear liquid diet, advancing as tolerated -Continue as needed IV analgesics

## 2023-07-10 NOTE — ED Notes (Signed)
Pt gone to CT 

## 2023-07-10 NOTE — Assessment & Plan Note (Signed)
-  History of TIAs no record of CVA -On statins, Plavix, holding Plavix for GI bleed

## 2023-07-10 NOTE — Assessment & Plan Note (Addendum)
Bloody diarrhea likely due to colitis -Obtaining stool study, cultures -Initiated on IV antibiotics in ED Rocephin/Flagyl, will continue with Cipro and Flagyl -Obtaining stool studies, including C. difficile

## 2023-07-10 NOTE — Assessment & Plan Note (Signed)
-   Monitoring BP closely, continue home medication atenolol, lisinopril,

## 2023-07-11 DIAGNOSIS — K5100 Ulcerative (chronic) pancolitis without complications: Secondary | ICD-10-CM | POA: Diagnosis not present

## 2023-07-11 DIAGNOSIS — K529 Noninfective gastroenteritis and colitis, unspecified: Secondary | ICD-10-CM

## 2023-07-11 DIAGNOSIS — K921 Melena: Secondary | ICD-10-CM

## 2023-07-11 LAB — GLUCOSE, CAPILLARY
Glucose-Capillary: 138 mg/dL — ABNORMAL HIGH (ref 70–99)
Glucose-Capillary: 201 mg/dL — ABNORMAL HIGH (ref 70–99)
Glucose-Capillary: 158 mg/dL — ABNORMAL HIGH (ref 70–99)
Glucose-Capillary: 138 mg/dL — ABNORMAL HIGH (ref 70–99)

## 2023-07-11 LAB — BASIC METABOLIC PANEL
Anion gap: 9 (ref 5–15)
BUN: 13 mg/dL (ref 8–23)
CO2: 22 mmol/L (ref 22–32)
Calcium: 8.4 mg/dL — ABNORMAL LOW (ref 8.9–10.3)
Chloride: 99 mmol/L (ref 98–111)
Creatinine, Ser: 0.88 mg/dL (ref 0.44–1.00)
GFR, Estimated: >60 mL/min (ref >60)
Glucose, Bld: 144 mg/dL — ABNORMAL HIGH (ref 70–99)
Potassium: 4.2 mmol/L (ref 3.5–5.1)
Sodium: 130 mmol/L — ABNORMAL LOW (ref 135–145)

## 2023-07-11 LAB — CBC
HCT: 34.4 % — ABNORMAL LOW (ref 36.0–46.0)
Hemoglobin: 11.2 g/dL — ABNORMAL LOW (ref 12.0–15.0)
MCH: 28.5 pg (ref 26.0–34.0)
MCHC: 32.6 g/dL (ref 30.0–36.0)
MCV: 87.5 fL (ref 80.0–100.0)
Platelets: 217 10*3/uL (ref 150–400)
RBC: 3.93 MIL/uL (ref 3.87–5.11)
RDW: 14.9 % (ref 11.5–15.5)
WBC: 9.7 10*3/uL (ref 4.0–10.5)
nRBC: 0 % (ref 0.0–0.2)

## 2023-07-11 LAB — PROTIME-INR
INR: 1.1 (ref 0.8–1.2)
Prothrombin Time: 14.8 s (ref 11.4–15.2)

## 2023-07-11 LAB — C DIFFICILE QUICK SCREEN W PCR REFLEX
C Diff antigen: NEGATIVE
C Diff interpretation: NOT DETECTED
C Diff toxin: NEGATIVE

## 2023-07-11 LAB — APTT: aPTT: 28 seconds (ref 24–36)

## 2023-07-11 MED ORDER — CIPROFLOXACIN HCL 250 MG PO TABS
500.0000 mg | ORAL_TABLET | Freq: Two times a day (BID) | ORAL | Status: DC
  Administered 2023-07-11 – 2023-07-12 (×2): 500 mg via ORAL
  Filled 2023-07-11 (×2): qty 2

## 2023-07-11 MED ORDER — METRONIDAZOLE 500 MG PO TABS
500.0000 mg | ORAL_TABLET | Freq: Three times a day (TID) | ORAL | Status: DC
  Administered 2023-07-11 – 2023-07-12 (×3): 500 mg via ORAL
  Filled 2023-07-11 (×3): qty 1

## 2023-07-11 NOTE — Progress Notes (Addendum)
Subjective: Bleeding is tapering off. Now only light pink and a small amount x 2 today. Mild LLQ abdominal pain, but nothing significant, overall improving.  Stools remain loose/mushy.  No watery diarrhea.  Reports she did provide a stool sample today.  No nausea or vomiting.   Objective: Vital signs in last 24 hours: Temp:  [98.1 F (36.7 C)-98.8 F (37.1 C)] 98.1 F (36.7 C) (08/20 0348) Pulse Rate:  [68-76] 76 (08/20 0348) Resp:  [16-18] 18 (08/20 0348) BP: (118-142)/(62-76) 142/62 (08/20 0348) SpO2:  [90 %-98 %] 98 % (08/20 0348) Weight:  [81.4 kg] 81.4 kg (08/20 0348) Last BM Date : 07/10/23 General:   Alert and oriented, pleasant, NAD Head:  Normocephalic and atraumatic. Abdomen:  Bowel sounds present, soft, non-distended. Mild TTP in LLQ.  Msk:  Symmetrical without gross deformities. Normal posture. Extremities:  Without edema. Neurologic:  Alert and  oriented x4;  grossly normal neurologically. Psych:  Normal mood and affect.  Intake/Output from previous day: 08/19 0701 - 08/20 0700 In: 1297.4 [I.V.:985; IV Piggyback:312.4] Out: -  Intake/Output this shift: No intake/output data recorded.  Lab Results: Recent Labs    07/10/23 0816 07/11/23 0405  WBC 18.3* 9.7  HGB 14.8 11.2*  HCT 45.3 34.4*  PLT 294 217   BMET Recent Labs    07/10/23 0816 07/11/23 0405  NA 130* 130*  K 4.2 4.2  CL 97* 99  CO2 20* 22  GLUCOSE 191* 144*  BUN 19 13  CREATININE 1.14* 0.88  CALCIUM 9.5 8.4*    PT/INR Recent Labs    07/10/23 0816 07/11/23 0405  LABPROT 13.6 14.8  INR 1.0 1.1    Studies/Results: CT Angio Abd/Pel W and/or Wo Contrast  Result Date: 07/10/2023 CLINICAL DATA:  77 year old female with presumed lower GI bleeding EXAM: CTA ABDOMEN AND PELVIS WITHOUT AND WITH CONTRAST TECHNIQUE: Multidetector CT imaging of the abdomen and pelvis was performed using the standard protocol during bolus administration of intravenous contrast. Multiplanar reconstructed  images and MIPs were obtained and reviewed to evaluate the vascular anatomy. RADIATION DOSE REDUCTION: This exam was performed according to the departmental dose-optimization program which includes automated exposure control, adjustment of the mA and/or kV according to patient size and/or use of iterative reconstruction technique. CONTRAST:  75mL OMNIPAQUE IOHEXOL 350 MG/ML SOLN COMPARISON:  09/21/2019 FINDINGS: VASCULAR Aorta: Atherosclerotic changes of the lower thoracic aorta. Diameter at the hiatus estimated 25 mm. Moderate atherosclerosis of the abdominal aorta. No pedunculated plaque or ulcerated plaque. No dissection. No aneurysm. 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 Branches are patent. SMA: Atherosclerotic changes at the origin of the superior mesenteric artery with no CT evidence of high-grade stenosis. Branches are patent. Renals: - Right: Single right renal artery. Mild calcified plaque at the origin without evidence of high-grade stenosis. - Left: Single left renal artery. Calcified and atheromatous plaque at the origin of the left renal artery with greater than 50% stenosis. IMA: Inferior mesenteric artery is patent. Right lower extremity: Mild tortuosity of the right iliac system. Mild atherosclerotic changes of the right iliac system. No aneurysm, dissection, or occlusion. Hypogastric artery is patent. Common femoral artery patent. Proximal SFA and profunda femoris patent. Left lower extremity: Mild tortuosity of the left iliac system. Mild atherosclerotic changes of the left iliac system. No aneurysm, dissection, or occlusion. Hypogastric artery is patent. Common femoral artery patent. Proximal SFA and profunda femoris patent. Veins: Unremarkable appearance of the venous  system. Review of the MIP images confirms the above findings. NON-VASCULAR Lower chest: No acute. Hepatobiliary: Unremarkable appearance of the liver. Unremarkable  gallbladder. Gas within the gallbladder lumen as well as the biliary tree in the hilum of the liver. Pancreas: Unremarkable. Spleen: Unremarkable. Adrenals/Urinary Tract: - Right adrenal gland: Unremarkable - Left adrenal gland: Unremarkable. - Right kidney: No hydronephrosis, nephrolithiasis, inflammation, or ureteral dilation. No focal lesion. - Left Kidney: No hydronephrosis, nephrolithiasis, inflammation, or ureteral dilation. No focal lesion. - Urinary Bladder: Unremarkable. Stomach/Bowel: - Stomach: Unremarkable. - Small bowel: Unremarkable - Appendix: Normal. - Colon: No accumulation of contrast within the lumen of the bowel. Circumferential thickening of the colonic wall in the distal transverse colon, splenic flexure, descending colon, sigmoid colon. Diverticula are present. The late phase imaging demonstrates evidence enhancement of the bowel wall. Lymphatic: No adenopathy. Mesenteric: Stranding within the fat adjacent to the left colon. Trace reactive fluid at the dependent pelvis. Reproductive: Hysterectomy Other: No hernia. Musculoskeletal: Scoliotic curvature of the lumbar spine, apex right most likely degenerative. Multilevel disc disease throughout the visualized thoracolumbar spine with vacuum disc phenomenon throughout lumbar spine. Advanced facet disease of all lumbar levels. No displaced fracture. Degenerative changes of the hips. IMPRESSION: CT angiogram of the abdomen/pelvis negative for evidence of acute gastrointestinal hemorrhage. Acute colitis of the left colon, extending from the distal transverse colon through the sigmoid colon. Given the underlying diverticula, acute diverticulitis would be the leading differential diagnosis, however, other infectious or inflammatory causes could alternatively account for the length of bowel involved. Ischemic is favored not likely as CT demonstrates maintained enhancement. Gas within the biliary system, presumably secondary to prior intervention. 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 Aortic Atherosclerosis (ICD10-I70.0). Additional ancillary findings as above. Signed, Yvone Neu. Miachel Roux, RPVI [facility] Electronically Signed   By: Gilmer Mor D.O.   On: 07/10/2023 12:08    Assessment: 77 y.o. year old female with history of prior CVA, DM, diverticulosis, GERD, OSA, sleep apnea, ischemic vs segmental colitis in Jan 2020, presenting this admission with abdominal pain and rectal bleeding on Plavix, with last dose 8/18.   CT angio A/P with and without contrast completed in the ER showing acute colitis of the left colon, extending from the distal transverse colon through the sigmoid colon.  Given underlying diverticula, acute diverticulitis would be the leading differential diagnosis versus other infectious or inflammatory causes as alternative.  Ischemia favor less likely.  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  Colitis/rectal bleeding:  Differentials include ischemic colitis as patient reported acute onset abdominal pain prior to rectal bleeding versus diverticular bleed with diverticulitis versus infectious colitis.  She was started on empiric antibiotics.  C. difficile, GI pathogen panel, stool culture are in process. Hemoglobin has declined from 14.8 on admission to 11.2 today.  Clinically, patient is feeling improved.  Now only with very mild LLQ abdominal pain and bleeding is tapering off.  She has had 2 episodes of low-volume, light red blood per rectum today.  Plavix remains on hold.    Recommend continuing empiric antibiotics for now, continue supportive  measures, outpatient colonoscopy once improved unless GI bleeding doesn't continue to taper off.  Notably, last  colonoscopy was in 2019 with pancolonic diverticulosis, one 5 mm polyp (leiomyoma).  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   Plan: Continue empiric antibiotics. Continue to trend H&H. Continue to hold Plavix for now.  Continue supportive measures. Follow-up on stool studies. Outpatient colonoscopy once improved.   LOS: 1 day    07/11/2023, 12:34 PM   Ermalinda Memos, [facility]astroenter[facility]

## 2023-07-11 NOTE — Progress Notes (Signed)
   07/11/23 1050  TOC Brief Assessment  Insurance and Status Reviewed  Patient has primary care physician Yes  Home environment has been reviewed from home with family  Prior level of function: independent  Prior/Current Home Services No current home services  Social Determinants of Health Reivew SDOH reviewed no interventions necessary  Readmission risk has been reviewed Yes  Transition of care needs no transition of care needs at this time    Transition of Care Department [facility]) has reviewed patient and no TOC needs have been identified at this time. We will continue to monitor patient advancement through interdisciplinary progression rounds. If new patient transition needs arise, please place a TOC consult.

## 2023-07-11 NOTE — Progress Notes (Signed)
PROGRESS NOTE    Patient: Brittany Goodman                            PCP: Assunta Found, MD                    DOB: 1945-12-13            DOA: 07/10/2023 ZOX:[id number]             DOS: 07/11/2023, 11:07 AM   LOS: 1 day   Date of Service: The patient was seen and examined on 07/11/2023  Subjective:   The patient was seen and examined this morning. Hemodynamically stable. No issues overnight .  Brief Narrative:   Brittany Goodman is a 77 year old female past medical history significant for TIAs on Plavix, HTN, HLD, vitamin D patient C, DM2, GERD, history of colitis in the past. Presenting with chief complaint of bloody stools.  Patient history of diverticulitis,  Reported her symptoms started early this morning about 3 AM she has had about 4 bloody stool since bright red per rectum.  History also reported of dark stool.   ED elevation/course: Blood pressure (!) 140/88, pulse 71, temperature 97.8 F (36.6 C), RR17, height 5\' 4"  (1.626 m), weight 77.1 kg, SpO2 95%.  Labs: CBC 18.3, hemoglobin 14.8, sodium 130, chloride 97, CO2 20, creatinine 1.14, INR 1.0, glucose 191,  CT angio abdomen/pelvis: IMPRESSION: CT angiogram of the abdomen/pelvis negative for evidence of acute gastrointestinal hemorrhage.   Acute colitis of the left colon, extending from the distal transverse colon through the sigmoid colon. Given the underlying diverticula, acute diverticulitis would be the leading differential diagnosis, however, other infectious or inflammatory causes could alternatively account for the length of bowel involved. Ischemic is favored not likely as CT demonstrates maintained enhancement.   Gas within the biliary system, presumably secondary to prior intervention.   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 Aortic Atherosclerosis  ED provider asked for patient to be admitted for colitis,  IV antibiotic initiated, GI consulted        Assessment & Plan:   Principal Problem:   Pancolitis (HCC) Active Problems:   Cerebrovascular disease   Abdominal pain, left lower quadrant   Diabetes mellitus type 2, noninsulin dependent (HCC)   Mixed hyperlipidemia   CVA (cerebral vascular accident) (HCC)   Hypothyroidism   HYPERTENSION, BENIGN ESSENTIAL   GERD (gastroesophageal reflux disease)   Hyponatremia   Diarrhea   Depression     Assessment and Plan: * Pancolitis (HCC) -Much improved, hemodynamically stable Afebrile, normotensive, WBC 18.3 >>> 9.7  -Consulted gastroenterology -following -Continue IV fluids, -In ED was given Flagyl/Rocephin, will switch to IV Cipro/Flagyl -Continue as needed IV analgesics -Advancing diet per GI    Diabetes mellitus type 2, noninsulin dependent (HCC) -Tolerating p.o., -Checking CBG q. ACHS, with SSI coverage -Holding home medication of Metformin  Abdominal pain, left lower quadrant - Due to pancolitis -Per GI starting clear liquid diet, advancing as tolerated -Continue as needed IV analgesics  Cerebrovascular disease -History of TIAs no record of CVA -On statins, Plavix, - holding Plavix for GI bleed  Hypothyroidism -Continue home dose Synthroid  CVA (cerebral vascular accident) (HCC) -History of TIAs no record of CVA -On statins, Plavix, holding Plavix for GI bleed  Mixed hyperlipidemia -  Will continue statins  Depression - Stable continue home medication including Celexa  Diarrhea -Reporting about 4 episodes overnight, with some blood tinged -Stool study, cultures >>>>> pending -Initiated on IV antibiotics in ED Rocephin/Flagyl, will continue with Cipro and Flagyl -Low suspicious for C. difficile, pending stool studies   Hyponatremia -Due to diarrhea Continue IV fluids saline, monitoring closely  GERD  (gastroesophageal reflux disease) - Patient and Protonix I.V. 40 twice daily  HYPERTENSION, BENIGN ESSENTIAL - Monitoring BP closely, continue home medication atenolol, lisinopril,    -------------------------------------------------------------------------------------------------------------------------------------- Nutritional status:  The patient's BMI is: Body mass index is 30.8 kg/m. I agree with the assessment and plan as outline --------------------------------------------------------------------------------------------------------------------------------  DVT prophylaxis:  TED hose Start: 07/10/23 1327 SCDs Start: 07/10/23 1327   Code Status:   Code Status: Full Code  Family Communication: No family member present at bedside- attempt will be made to update daily The above findings and plan of care has been discussed with patient (and family)  in detail,  they expressed understanding and agreement of above. -Advance care planning has been discussed.   Admission status:   Status is: Inpatient Remains inpatient appropriate because: Needing IV fluids, antibiotics, diarrhea with some blood   Disposition: From  - home             Planning for discharge in 2 days to HOME   Procedures:   No admission procedures for hospital encounter.   Antimicrobials:  Anti-infectives (From admission, onward)    Start     Dose/Rate Route Frequency Ordered Stop   07/11/23 2000  ciprofloxacin (CIPRO) tablet 500 mg        500 mg Oral 2 times daily 07/11/23 1102     07/11/23 1400  metroNIDAZOLE (FLAGYL) tablet 500 mg        500 mg Oral Every 8 hours 07/11/23 1101     07/10/23 2200  metroNIDAZOLE (FLAGYL) IVPB 500 mg  Status:  Discontinued        500 mg 100 mL/hr over 60 Minutes Intravenous Every 12 hours 07/10/23 1330 07/11/23 1101   07/10/23 1700  ciprofloxacin (CIPRO) IVPB 400 mg  Status:  Discontinued        400 mg 200 mL/hr over 60 Minutes Intravenous Every 12 hours 07/10/23 1330  07/11/23 1102   07/10/23 1330  cefTRIAXone (ROCEPHIN) 1 g in sodium chloride 0.9 % 100 mL IVPB  Status:  Discontinued        1 g 200 mL/hr over 30 Minutes Intravenous Every 24 hours 07/10/23 1326 07/10/23 1330   07/10/23 1300  cefTRIAXone (ROCEPHIN) 1 g in sodium chloride 0.9 % 100 mL IVPB        1 g 200 mL/hr over 30 Minutes Intravenous  Once 07/10/23 1248 07/10/23 1438   07/10/23 1300  metroNIDAZOLE (FLAGYL) IVPB 500 mg        500 mg 100 mL/hr over 60 Minutes Intravenous  Once 07/10/23 1248 07/10/23 1545        Medication:   acidophilus  2 capsule Oral TID   atenolol  100 mg Oral q morning   ciprofloxacin  500 mg Oral BID   citalopram  20 mg Oral QODAY   [START ON 07/12/2023] clopidogrel  75 mg Oral Daily   insulin aspart  0-6 Units Subcutaneous TID WC   levothyroxine  88 mcg Oral QAC breakfast   lisinopril  20 mg Oral Daily   metroNIDAZOLE  500 mg Oral Q8H   pantoprazole (PROTONIX) IV  40 mg Intravenous  Q12H   sodium chloride flush  3 mL Intravenous Q12H    acetaminophen **OR** acetaminophen, cyclobenzaprine, hydrALAZINE, ipratropium, levalbuterol, ondansetron **OR** ondansetron (ZOFRAN) IV, oxyCODONE, traZODone   Objective:   Vitals:   07/10/23 1345 07/10/23 1557 07/10/23 2036 07/11/23 0348  BP: 124/73 131/65 125/65 (!) 142/62  Pulse: 75 69 76 76  Resp: 16 17 18 18   Temp:  98.1 F (36.7 C) 98.8 F (37.1 C) 98.1 F (36.7 C)  TempSrc:  Oral Oral Oral  SpO2: 94% 98% 97% 98%  Weight:    81.4 kg  Height:        Intake/Output Summary (Last 24 hours) at 07/11/2023 1107 Last data filed at 07/11/2023 0400 Gross per 24 hour  Intake 1297.42 ml  Output --  Net 1297.42 ml   Filed Weights   07/10/23 0810 07/11/23 0348  Weight: 77.1 kg 81.4 kg     Physical examination:   Constitution:  Alert, cooperative, no distress,  Appears calm and comfortable  Psychiatric:   Normal and stable mood and affect, cognition intact,   HEENT:        Normocephalic, PERRL, otherwise  with in Normal limits  Chest:         Chest symmetric Cardio vascular:  S1/S2, RRR, No murmure, No Rubs or Gallops  pulmonary: Clear to auscultation bilaterally, respirations unlabored, negative wheezes / crackles Abdomen: Soft, non-tender, non-distended, bowel sounds,no masses, no organomegaly Muscular skeletal: Limited exam - in bed, able to move all 4 extremities,   Neuro: CNII-XII intact. , normal motor and sensation, reflexes intact  Extremities: No pitting edema lower extremities, +2 pulses  Skin: Dry, warm to touch, negative for any Rashes, No open wounds Wounds: per nursing documentation   ------------------------------------------------------------------------------------------------------------------------------------------    LABs:     Latest Ref Rng & Units 07/11/2023    4:05 AM 07/10/2023    8:16 AM 01/25/2022   11:36 PM  CBC  WBC 4.0 - 10.5 K/uL 9.7  18.3  11.3   Hemoglobin 12.0 - 15.0 g/dL 86.5  78.4  69.6   Hematocrit 36.0 - 46.0 % 34.4  45.3  41.0   Platelets 150 - 400 K/uL 217  294  236       Latest Ref Rng & Units 07/11/2023    4:05 AM 07/10/2023    8:16 AM 01/25/2022   11:36 PM  CMP  Glucose 70 - 99 mg/dL 295  284  132   BUN 8 - 23 mg/dL 13  19  13    Creatinine 0.44 - 1.00 mg/dL 4.40  1.02  7.25   Sodium 135 - 145 mmol/L 130  130  136   Potassium 3.5 - 5.1 mmol/L 4.2  4.2  3.6   Chloride 98 - 111 mmol/L 99  97  102   CO2 22 - 32 mmol/L 22  20  23    Calcium 8.9 - 10.3 mg/dL 8.4  9.5  9.1   Total Protein 6.5 - 8.1 g/dL   7.1   Total Bilirubin 0.3 - 1.2 mg/dL   0.2   Alkaline Phos 38 - 126 U/L   62   AST 15 - 41 U/L   35   ALT 0 - 44 U/L   16        Micro Results No results found for this or any previous visit (from the past 240 hour(s)).  Radiology Reports No results found.  SIGNED: Kendell Bane, MD, FHM. FAAFP. [facility] - [facility] Time spent -  35 min.  In seeing, evaluating and examining the patient. Reviewing medical records,  labs, drawn plan of care. [facility],  Pager (please use [email or web address] to page/ text) Please use Epic Secure Chat for non-urgent communication (7AM-7PM)  If 7PM-7AM, please contact night-coverage [email or web address], 07/11/2023, 11:07 AM

## 2023-07-12 ENCOUNTER — Telehealth (INDEPENDENT_AMBULATORY_CARE_PROVIDER_SITE_OTHER): Payer: Self-pay | Admitting: Gastroenterology

## 2023-07-12 DIAGNOSIS — K5100 Ulcerative (chronic) pancolitis without complications: Secondary | ICD-10-CM | POA: Diagnosis not present

## 2023-07-12 LAB — CBC
HCT: 33.9 % — ABNORMAL LOW (ref 36.0–46.0)
Hemoglobin: 11 g/dL — ABNORMAL LOW (ref 12.0–15.0)
MCH: 28.1 pg (ref 26.0–34.0)
MCHC: 32.4 g/dL (ref 30.0–36.0)
MCV: 86.7 fL (ref 80.0–100.0)
Platelets: 203 10*3/uL (ref 150–400)
RBC: 3.91 MIL/uL (ref 3.87–5.11)
RDW: 15 % (ref 11.5–15.5)
WBC: 6 10*3/uL (ref 4.0–10.5)
nRBC: 0 % (ref 0.0–0.2)

## 2023-07-12 LAB — GASTROINTESTINAL PANEL BY PCR, STOOL (REPLACES STOOL CULTURE)

## 2023-07-12 LAB — BASIC METABOLIC PANEL
Anion gap: 6 (ref 5–15)
BUN: 10 mg/dL (ref 8–23)
CO2: 25 mmol/L (ref 22–32)
Calcium: 8.4 mg/dL — ABNORMAL LOW (ref 8.9–10.3)
Chloride: 102 mmol/L (ref 98–111)
Creatinine, Ser: 0.9 mg/dL (ref 0.44–1.00)
GFR, Estimated: >60 mL/min (ref >60)
Glucose, Bld: 148 mg/dL — ABNORMAL HIGH (ref 70–99)
Potassium: 3.9 mmol/L (ref 3.5–5.1)
Sodium: 133 mmol/L — ABNORMAL LOW (ref 135–145)

## 2023-07-12 LAB — GLUCOSE, CAPILLARY: Glucose-Capillary: 104 mg/dL — ABNORMAL HIGH (ref 70–99)

## 2023-07-12 MED ORDER — PANTOPRAZOLE SODIUM 40 MG PO TBEC
40.0000 mg | DELAYED_RELEASE_TABLET | Freq: Every day | ORAL | Status: DC
  Administered 2023-07-12: 40 mg via ORAL
  Filled 2023-07-12: qty 1

## 2023-07-12 MED ORDER — LACTINEX PO CHEW: 1.0000 | CHEWABLE_TABLET | Freq: Three times a day (TID) | ORAL | 0 refills | Status: AC

## 2023-07-12 MED ORDER — OMEPRAZOLE 20 MG PO CPDR: 20.0000 mg | DELAYED_RELEASE_CAPSULE | Freq: Every day | ORAL | 0 refills | Status: DC

## 2023-07-12 MED ORDER — METRONIDAZOLE 500 MG PO TABS: 500.0000 mg | ORAL_TABLET | Freq: Three times a day (TID) | ORAL | 0 refills | Status: AC

## 2023-07-12 MED ORDER — CLOPIDOGREL BISULFATE 75 MG PO TABS: 75.0000 mg | ORAL_TABLET | Freq: Every day | ORAL | Status: DC

## 2023-07-12 MED ORDER — CIPROFLOXACIN HCL 500 MG PO TABS: 500.0000 mg | ORAL_TABLET | Freq: Two times a day (BID) | ORAL | 0 refills | Status: AC

## 2023-07-12 NOTE — TOC Transition Note (Signed)
Transition of Care [facility]) - CM/SW Discharge Note   Patient Details  Name: Brittany Goodman MRN: [id number] Date of Birth: [birth year]  [facility] CM/SW Contact:  Villa Herb, LCSWA Phone Number: 07/12/2023, 11:27 AM   Clinical Narrative:    CSW updated by RN that a bedside commode was ordered, CSW noted incorrect DME order. CSW requested that MD place order for DME bedside commode. RN states pt has already been discharged home. CSW spoke with pts son who is transporting pt home, CSW inquired about HH being set up as recommended by PT. They are not interested at this time and will follow up with PCP if they change their mind. They are requesting the bedside commode. CSW explained order sent to [employer] and they will deliver. TOC singing off.   Final next level of care: Home/Self Care Barriers to Discharge: No Barriers Identified   Patient Goals and CMS Choice CMS Medicare.gov Compare Post Acute Care list provided to:: Patient Choice offered to / list presented to : Patient  Discharge Placement                         Discharge Plan and Services Additional resources added to the After Visit Summary for                  DME Arranged: 3-N-1 DME Agency: [employer] Date DME Agency Contacted: 07/12/23   Representative spoke with at DME Agency: Ian Malkin            Social Determinants of Health (SDOH) Interventions SDOH Screenings   Food Insecurity: No Food Insecurity (07/10/2023)  Housing: Low Risk  (07/10/2023)  Transportation Needs: No Transportation Needs (07/10/2023)  Utilities: Not At Risk (07/10/2023)  Depression (PHQ2-9): Low Risk  (09/14/2019)  Tobacco Use: Medium Risk (07/10/2023)     Readmission Risk Interventions     No data to display

## 2023-07-12 NOTE — Discharge Summary (Signed)
Physician Discharge Summary   Patient: Brittany Goodman MRN: [id number] DOB: 11-Jan-1946  Admit date:     07/10/2023  Discharge date: 07/12/23  Discharge Physician: Kendell Bane   PCP: Assunta Found, MD   Recommendations at discharge:  Hold your Plavix for total of 5 days (hold if any persistent bloody diarrhea) Continue current recommended antibiotics of ciprofloxacin and Flagyl Follow-up with the PCP and gastroenterologist in 1-2 weeks  Discharge Diagnoses: Principal Problem:   Pancolitis (HCC) Active Problems:   Cerebrovascular disease   Abdominal pain, left lower quadrant   Diabetes mellitus type 2, noninsulin dependent (HCC)   Mixed hyperlipidemia   CVA (cerebral vascular accident) (HCC)   Hypothyroidism   HYPERTENSION, BENIGN ESSENTIAL   GERD (gastroesophageal reflux disease)   Hyponatremia   Diarrhea   Depression   Colitis  Resolved Problems:   * No resolved hospital problems. *  Hospital Course: Brittany Goodman is a 77 year old female past medical history significant for TIAs on Plavix, HTN, HLD, vitamin D patient C, DM2, GERD, history of colitis in the past. Presenting with chief complaint of bloody stools.  Patient history of diverticulitis,  Reported her symptoms started early this morning about 3 AM she has had about 4 bloody stool since bright red per rectum.  History also reported of dark stool.   ED elevation/course: Blood pressure (!) 140/88, pulse 71, temperature 97.8 F (36.6 C), RR17, height 5\' 4"  (1.626 m), weight 77.1 kg, SpO2 95%.  Labs: CBC 18.3, hemoglobin 14.8, sodium 130, chloride 97, CO2 20, creatinine 1.14, INR 1.0, glucose 191,  CT angio abdomen/pelvis: IMPRESSION: CT angiogram of the abdomen/pelvis negative for evidence of acute gastrointestinal hemorrhage.   Acute colitis of the left colon, extending from the distal transverse colon through the sigmoid colon. Given the underlying diverticula, acute diverticulitis would be the leading  differential diagnosis, however, other infectious or inflammatory causes could alternatively account for the length of bowel involved. Ischemic is favored not likely as CT demonstrates maintained enhancement.   Gas within the biliary system, presumably secondary to prior intervention.   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 Aortic Atherosclerosis  ED provider asked for patient to be admitted for colitis,  IV antibiotic initiated, GI consulted      * Pancolitis (HCC) -Much improved, hemodynamically stable Afebrile, normotensive, WBC 18.3 >>> 9.7 >> 6.0  -GI was consulted-was following -Status post IV fluid -In ED was given Flagyl/Rocephin, will switch to IV Cipro/Flagyl>> to p.o. -Tolerating diet       Diabetes mellitus type 2, noninsulin dependent (HCC) -Tolerating p.o., -Resume home medication of Metformin -Hospitalization metformin was held, blood sugars were checked q. ACHS, with SSI coverage   Abdominal pain, left lower quadrant -Due to colitis-improved -Tolerating p.o.  Cerebrovascular disease -History of TIAs no record of CVA -On statins, Plavix, - holding Plavix for GI bleed--continue to hold for total of 5 days   Hypothyroidism -Continue home dose Synthroid      Mixed hyperlipidemia -Will continue statins   Depression - Stable continue home medication including Celexa   Diarrhea -Blood-tinged diarrhea-improved -Stool study, cultures >>>>> no growth to date -Stool for C. difficile-all negative    Hyponatremia -Resolved -Due to diarrhea -Responded well to IV fluids saline,   GERD (gastroesophageal reflux disease) -Continue PPI (hold till end date antibiotics- Hold to reduce the risk of C.  difficile with antibiotics)   HYPERTENSION, BENIGN  ESSENTIAL continue home medication atenolol, lisinopril,   Consultants: GI Procedures performed: None Disposition: Home Diet recommendation:  Discharge Diet Orders (From admission, onward)     Start     Ordered   07/12/23 0000  Diet - low sodium heart healthy        07/12/23 1013           Carb modified diet DISCHARGE MEDICATION: Allergies as of 07/12/2023       Reactions   Celecoxib Swelling   Pt states she can tolerate ibuprofen   Codeine Other (See Comments)   "makes me feel bad"   Dilaudid [hydromorphone Hcl] Other (See Comments)   hallucination   Morphine Nausea And Vomiting   Statins    Muscle pain   Tetracycline Nausea And Vomiting        Medication List     TAKE these medications    acetaminophen 500 MG tablet Commonly known as: TYLENOL Take 500 mg by mouth 2 (two) times daily as needed for mild pain or moderate pain.   atenolol 100 MG tablet Commonly known as: TENORMIN Take 100 mg by mouth every morning.   ciprofloxacin 500 MG tablet Commonly known as: CIPRO Take 1 tablet (500 mg total) by mouth 2 (two) times daily for 5 days.   citalopram 20 MG tablet Commonly known as: CELEXA Take 20 mg by mouth every other day.   clopidogrel 75 MG tablet Commonly known as: PLAVIX Take 75 mg by mouth daily.   cyanocobalamin 1000 MCG tablet Commonly known as: VITAMIN B12 Take 1,000 mcg by mouth daily.   cyclobenzaprine 10 MG tablet Commonly known as: FLEXERIL Take 10 mg by mouth 3 (three) times daily as needed for muscle spasms.   folic acid 800 MCG tablet Commonly known as: FOLVITE Take 800 mcg by mouth daily.   HYDROcodone-acetaminophen 5-325 MG tablet Commonly known as: NORCO/VICODIN Take 1 tablet by mouth every 4 (four) hours as needed for moderate pain.   lactobacillus acidophilus & bulgar chewable tablet Chew 1 tablet by mouth 3 (three) times daily with meals for 10 days.   levothyroxine 100 MCG tablet Commonly known as: SYNTHROID Take  88 mcg by mouth daily before breakfast.   lisinopril 20 MG tablet Commonly known as: ZESTRIL Take 20 mg by mouth daily.   metFORMIN 1000 MG tablet Commonly known as: GLUCOPHAGE Take 1,000 mg by mouth daily with breakfast.   metroNIDAZOLE 500 MG tablet Commonly known as: FLAGYL Take 1 tablet (500 mg total) by mouth every 8 (eight) hours for 5 days.   NONFORMULARY OR COMPOUNDED ITEM Apply 1 application topically 3 (three) times daily as needed (Pain). Diclofenac, Baclofen, Gabapentin, Lidocaine, Menthol - Compounded at [facility]   omeprazole 20 MG capsule Commonly known as: PRILOSEC Take 1 capsule (20 mg total) by mouth daily. Start taking on: July 21, 2023 What changed: These instructions start on July 21, 2023. If you are unsure what to do until then, ask your doctor or other care provider.   simvastatin 10 MG tablet Commonly known as: ZOCOR Take 1 tablet (10 mg total) by mouth daily at 6 PM for 30 days.   vitamin C 1000 MG tablet Take 1,000-2,000 mg by mouth daily.   Vitamin D 125 MCG (5000 UT) Caps Take 1 capsule by mouth daily.        Discharge Exam: Filed Weights   07/10/23 0810 07/11/23 0348 07/12/23 0500  Weight: 77.1 kg 81.4 kg  81 kg        General:  AAO x 3,  cooperative, no distress;   HEENT:  Normocephalic, PERRL, otherwise with in Normal limits   Neuro:  CNII-XII intact. , normal motor and sensation, reflexes intact   Lungs:   Clear to auscultation BL, Respirations unlabored,  No wheezes / crackles  Cardio:    S1/S2, RRR, No murmure, No Rubs or Gallops   Abdomen:  Soft, non-tender, bowel sounds active all four quadrants, no guarding or peritoneal signs.  Muscular  skeletal:  Limited exam -global generalized weaknesses - in bed, able to move all 4 extremities,   2+ pulses,  symmetric, No pitting edema  Skin:  Dry, warm to touch, negative for any Rashes,  Wounds: Please see nursing documentation         Condition at discharge:  good  The results of significant diagnostics from this hospitalization (including imaging, microbiology, ancillary and laboratory) are listed below for reference.   Imaging Studies: CT Angio Abd/Pel W and/or Wo Contrast  Result Date: 07/10/2023 CLINICAL DATA:  77 year old female with presumed lower GI bleeding EXAM: CTA ABDOMEN AND PELVIS WITHOUT AND WITH CONTRAST TECHNIQUE: Multidetector CT imaging of the abdomen and pelvis was performed using the standard protocol during bolus administration of intravenous contrast. Multiplanar reconstructed images and MIPs were obtained and reviewed to evaluate the vascular anatomy. RADIATION DOSE REDUCTION: This exam was performed according to the departmental dose-optimization program which includes automated exposure control, adjustment of the mA and/or kV according to patient size and/or use of iterative reconstruction technique. CONTRAST:  75mL OMNIPAQUE IOHEXOL 350 MG/ML SOLN COMPARISON:  09/21/2019 FINDINGS: VASCULAR Aorta: Atherosclerotic changes of the lower thoracic aorta. Diameter at the hiatus estimated 25 mm. Moderate atherosclerosis of the abdominal aorta. No pedunculated plaque or ulcerated plaque. No dissection. No aneurysm. 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 Branches are patent. SMA: Atherosclerotic changes at the origin of the superior mesenteric artery with no CT evidence of high-grade stenosis. Branches are patent. Renals: - Right: Single right renal artery. Mild calcified plaque at the origin without evidence of high-grade stenosis. - Left: Single left renal artery. Calcified and atheromatous plaque at the origin of the left renal artery with greater than 50% stenosis. IMA: Inferior mesenteric artery is patent. Right lower extremity: Mild tortuosity of the right iliac system. Mild atherosclerotic changes of the right iliac system. No aneurysm, dissection, or occlusion. Hypogastric  artery is patent. Common femoral artery patent. Proximal SFA and profunda femoris patent. Left lower extremity: Mild tortuosity of the left iliac system. Mild atherosclerotic changes of the left iliac system. No aneurysm, dissection, or occlusion. Hypogastric artery is patent. Common femoral artery patent. Proximal SFA and profunda femoris patent. Veins: Unremarkable appearance of the venous system. Review of the MIP images confirms the above findings. NON-VASCULAR Lower chest: No acute. Hepatobiliary: Unremarkable appearance of the liver. Unremarkable gallbladder. Gas within the gallbladder lumen as well as the biliary tree in the hilum of the liver. Pancreas: Unremarkable. Spleen: Unremarkable. Adrenals/Urinary Tract: - Right adrenal gland: Unremarkable - Left adrenal gland: Unremarkable. - Right kidney: No hydronephrosis, nephrolithiasis, inflammation, or ureteral dilation. No focal lesion. - Left Kidney: No hydronephrosis, nephrolithiasis, inflammation, or ureteral dilation. No focal lesion. - Urinary Bladder: Unremarkable. Stomach/Bowel: - Stomach: Unremarkable. - Small bowel: Unremarkable - Appendix: Normal. - Colon: No accumulation of contrast within the lumen of the bowel. Circumferential thickening of the colonic wall in the distal transverse  colon, splenic flexure, descending colon, sigmoid colon. Diverticula are present. The late phase imaging demonstrates evidence enhancement of the bowel wall. Lymphatic: No adenopathy. Mesenteric: Stranding within the fat adjacent to the left colon. Trace reactive fluid at the dependent pelvis. Reproductive: Hysterectomy Other: No hernia. Musculoskeletal: Scoliotic curvature of the lumbar spine, apex right most likely degenerative. Multilevel disc disease throughout the visualized thoracolumbar spine with vacuum disc phenomenon throughout lumbar spine. Advanced facet disease of all lumbar levels. No displaced fracture. Degenerative changes of the hips. IMPRESSION: CT  angiogram of the abdomen/pelvis negative for evidence of acute gastrointestinal hemorrhage. Acute colitis of the left colon, extending from the distal transverse colon through the sigmoid colon. Given the underlying diverticula, acute diverticulitis would be the leading differential diagnosis, however, other infectious or inflammatory causes could alternatively account for the length of bowel involved. Ischemic is favored not likely as CT demonstrates maintained enhancement. Gas within the biliary system, presumably secondary to prior intervention. 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 Aortic Atherosclerosis (ICD10-I70.0). Additional ancillary findings as above. Signed, Yvone Neu. Miachel Roux, RPVI [facility] Electronically Signed   By: Gilmer Mor D.O.   On: 07/10/2023 12:08   CT HEAD WO CONTRAST ( )  Result Date: 06/12/2023 CLINICAL DATA:  Trauma, fall EXAM: CT HEAD WITHOUT CONTRAST TECHNIQUE: Contiguous axial images were obtained from the base of the skull through the vertex without intravenous contrast. RADIATION DOSE REDUCTION: This exam was performed according to the departmental dose-optimization program which includes automated exposure control, adjustment of the mA and/or kV according to patient size and/or use of iterative reconstruction technique. COMPARISON:  02/14/2019 FINDINGS: Brain: No acute intracranial findings are seen in noncontrast CT brain. There are no signs of bleeding within the cranium. Cortical sulci are prominent. There is decreased density in periventricular white matter. Vascular: Unremarkable. Skull: No acute findings are seen. Sinuses/Orbits: There is opacification of right side of sphenoid sinus. Similar finding was  seen in the previous study. Other: There is increased amount of CSF in the sella suggesting partial empty sella. IMPRESSION: No acute intracranial findings are seen in noncontrast CT brain. Atrophy. Electronically Signed   By: Ernie Avena M.D.   On: 06/12/2023 14:40    Microbiology: Results for orders placed or performed during the hospital encounter of 07/10/23  C Difficile Quick Screen w PCR reflex     Status: None   Collection Time: 07/11/23 11:28 AM   Specimen: Stool  Result Value Ref Range Status   C Diff antigen NEGATIVE NEGATIVE Final   C Diff toxin NEGATIVE NEGATIVE Final   C Diff interpretation No C. difficile detected.  Final    Comment: Performed at [facility], [address] [place], [place] [address]    Labs: CBC: Recent Labs  Lab 07/10/23 0816 07/11/23 0405 07/12/23 0441  WBC 18.3* 9.7 6.0  NEUTROABS 16.1*  --   --   HGB 14.8 11.2* 11.0*  HCT 45.3 34.4* 33.9*  MCV 86.1 87.5 86.7  PLT 294 217 203   Basic Metabolic Panel: Recent Labs  Lab 07/10/23 0816 07/11/23 0405 07/12/23 0441  NA 130* 130* 133*  K 4.2 4.2 3.9  CL 97* 99 102  CO2 20* 22 25  GLUCOSE 191* 144* 148*  BUN 19 13 10   CREATININE 1.14* 0.88 0.90  CALCIUM 9.5 8.4* 8.4*  MG 2.0  --   --   PHOS 4.7*  --   --    Liver Function Tests: No results for input(s): "AST", "ALT", "ALKPHOS", "BILITOT", "PROT", "ALBUMIN" in the last 168 hours. CBG: Recent Labs  Lab 07/11/23 0731 07/11/23 1126 07/11/23 1554 07/11/23 2039 07/12/23 0752  GLUCAP 138* 201* 158* 138* 104*    Discharge time spent: greater than 30 minutes.  Signed: Kendell Bane, MD [facility] 07/12/2023

## 2023-07-12 NOTE — Evaluation (Signed)
Physical Therapy Evaluation Patient Details Name: Brittany Goodman MRN: [id number] DOB: Aug 15, 1946 Today's Date: 07/12/2023  History of Present Illness  Brittany Goodman is a 77 year old female past medical history significant for TIAs on Plavix, HTN, HLD, vitamin D patient C, DM2, GERD, history of colitis in the past.  Presenting with chief complaint of bloody stools.  Patient history of diverticulitis,  Reported her symptoms started early this morning about 3 AM she has had about 4 bloody stool since bright red per rectum.  History also reported of dark stool.    Clinical Impression  Patient lying in bed on therapist arrival.  Her son is at bedside; she is pleasant and agreeable to therapy assessment.  She reports some mild neck pain but states nursing has given her something for it and it is now much more tolerable.    Patient performs bed mobility modified independent; taking extra time to perform.    She performs sit to stand to RW with CGA/SBA for safety and is able to ambulate in the hallway with RW and CGA to SBA x 125 ft.  She has noted decreased gait speed due to general weakness but no path deviation or loss of balance noted.  Patient returns to her bed and reports minimal to moderate fatigue only.  patient left in bed with call button in reach and her son at bedside.Patient will benefit from continued skilled therapy services during the remainder of her hospital stay and at the next recommended venue of care to address deficits and promote return to optimal function.          If plan is discharge home, recommend the following: A little help with walking and/or transfers;A little help with bathing/dressing/bathroom;Help with stairs or ramp for entrance   Can travel by private vehicle        Equipment Recommendations BSC/3in1  Recommendations for Other Services       Functional Status Assessment Patient has had a recent decline in their functional status and demonstrates the ability  to make significant improvements in function in a reasonable and predictable amount of time.     Precautions / Restrictions Precautions Precautions: Fall Restrictions Weight Bearing Restrictions: No      Mobility  Bed Mobility Overal bed mobility: Modified Independent             General bed mobility comments: takes extra time to come to edge of bed Patient Response: Cooperative  Transfers Overall transfer level: Modified independent Equipment used: Rolling walker (2 wheels)                    Ambulation/Gait Ambulation/Gait assistance: Supervision, Contact guard assist Gait Distance (Feet): 120 Feet Assistive device: Rolling walker (2 wheels)         General Gait Details: decreased gait speed  Stairs            Wheelchair Mobility     Tilt Bed Tilt Bed Patient Response: Cooperative  Modified Rankin (Stroke Patients Only)       Balance Overall balance assessment: Needs assistance Sitting-balance support: Feet supported Sitting balance-Leahy Scale: Good       Standing balance-Leahy Scale: Fair Standing balance comment: fair to good standing balance with RW; no noted loss of balance today                             Pertinent Vitals/Pain Pain Assessment Pain Assessment: 0-10 Pain Score: 2  Pain Location: neck Pain Descriptors / Indicators: Aching Pain Intervention(s): Monitored during session    Home Living Family/patient expects to be discharged to:: Private residence Living Arrangements: Children Available Help at Discharge: Family;Available 24 hours/day Type of Home: House Home Access: Stairs to enter Entrance Stairs-Rails: None Entrance Stairs-Number of Steps: 1 Alternate Level Stairs-Number of Steps: 12 Home Layout: Two level Home Equipment: Agricultural consultant (2 wheels);Rollator (4 wheels);Standard Walker;Cane - single point;Shower seat Additional Comments: would like a BSC    Prior Function                        Extremity/Trunk Assessment   Upper Extremity Assessment Upper Extremity Assessment: Generalized weakness    Lower Extremity Assessment Lower Extremity Assessment: Generalized weakness    Cervical / Trunk Assessment Cervical / Trunk Assessment: Neck Surgery;Back Surgery  Communication   Communication Communication: No apparent difficulties  Cognition Arousal: Alert Behavior During Therapy: WFL for tasks assessed/performed Overall Cognitive Status: Within Functional Limits for tasks assessed                                          General Comments      Exercises     Assessment/Plan    PT Assessment Patient needs continued PT services  PT Problem List Decreased strength;Decreased activity tolerance       PT Treatment Interventions Gait training;Stair training;Functional mobility training;Therapeutic activities;Therapeutic exercise;Balance training;Patient/family education    PT Goals (Current goals can be found in the Care Plan section)  Acute Rehab PT Goals Patient Stated Goal: return home PT Goal Formulation: With patient/family Time For Goal Achievement: 07/26/23 Potential to Achieve Goals: Good    Frequency Min 2X/week     Co-evaluation               AM-PAC PT "6 Clicks" Mobility  Outcome Measure Help needed turning from your back to your side while in a flat bed without using bedrails?: None Help needed moving from lying on your back to sitting on the side of a flat bed without using bedrails?: None Help needed moving to and from a bed to a chair (including a wheelchair)?: A Little Help needed standing up from a chair using your arms (e.g., wheelchair or bedside chair)?: A Little Help needed to walk in hospital room?: A Little Help needed climbing 3-5 steps with a railing? : A Lot 6 Click Score: 19    End of Session   Activity Tolerance: Patient tolerated treatment well Patient left: in bed;with call bell/phone  within reach;with family/visitor present Nurse Communication: Mobility status PT Visit Diagnosis: Muscle weakness (generalized) (M62.81)    Time: 4098-1191 PT Time Calculation (min) (ACUTE ONLY): 20 min   Charges:   PT Evaluation $PT Eval Low Complexity: 1 Low   PT General Charges $$ ACUTE PT VISIT: 1 Visit        10:49 AM, 07/12/23 Simrit Gohlke Small Gurney Balthazor MPT [facility] [place] [phone] Ph:[phone]

## 2023-07-12 NOTE — Care Management Important Message (Signed)
Important Message  Patient Details  Name: Brittany Goodman MRN: [id number] Date of Birth: [birth year]   Medicare Important Message Given:  N/A - LOS <3 / Initial given by admissions     Corey Harold 07/12/2023, 11:06 AM

## 2023-07-12 NOTE — Plan of Care (Signed)
  Problem: Acute Rehab PT Goals(only PT should resolve) Goal: Pt Will Go Supine/Side To Sit Outcome: Progressing Flowsheets (Taken 07/12/2023 1050) Pt will go Supine/Side to Sit: Independently Goal: Patient Will Transfer Sit To/From Stand Outcome: Progressing Flowsheets (Taken 07/12/2023 1050) Patient will transfer sit to/from stand: Independently Goal: Pt Will Transfer Bed To Chair/Chair To Bed Outcome: Progressing Flowsheets (Taken 07/12/2023 1050) Pt will Transfer Bed to Chair/Chair to Bed: with supervision Goal: Pt Will Ambulate Outcome: Progressing Flowsheets (Taken 07/12/2023 1050) Pt will Ambulate:  > 125 feet  with least restrictive assistive device  with supervision

## 2023-07-12 NOTE — Progress Notes (Signed)
The beneficiary is confined to one level of the home environment and there is no toilet on that level and will need a bedside commode in order to safely transition home from hospital.

## 2023-07-13 NOTE — Telephone Encounter (Signed)
I spoke with patient to see if she could come on 9/5, but her son is her transportation and he will be on vacation during that time and she is going to get with him to see when he is off work to bring her. I gave her my direct number and the front office number to call.

## 2023-07-15 LAB — STOOL CULTURE: E coli, Shiga toxin Assay: NEGATIVE

## 2023-07-15 LAB — STOOL CULTURE REFLEX - RSASHR

## 2023-07-15 LAB — STOOL CULTURE REFLEX - CMPCXR

## 2023-07-25 DIAGNOSIS — H6123 Impacted cerumen, bilateral: Secondary | ICD-10-CM | POA: Diagnosis not present

## 2023-07-25 DIAGNOSIS — Z7901 Long term (current) use of anticoagulants: Secondary | ICD-10-CM | POA: Diagnosis not present

## 2023-07-25 DIAGNOSIS — E119 Type 2 diabetes mellitus without complications: Secondary | ICD-10-CM | POA: Diagnosis not present

## 2023-07-27 ENCOUNTER — Inpatient Hospital Stay: Payer: Medicare HMO | Admitting: Gastroenterology

## 2023-08-02 NOTE — Progress Notes (Unsigned)
Referring Provider: Assunta Found, MD Primary Care Physician:  Assunta Found, MD Primary GI Physician: Dr. Jena Gauss  Chief Complaint  Patient presents with   Follow-up    Hospital follow up    HPI:   Brittany Goodman is a 77 y.o. female presenting with history of prior CVA, DM, diverticulosis, GERD, OSA, sleep apnea, ischemic vs segmental colitis in Jan 2020, presenting today for hospital follow-up.  She was admitted to the hospital 8/19 - 8/21 after presenting to the hospital with abdominal pain and rectal bleeding on Plavix.  CT angio A/P with and without contrast showed acute colitis of the left colon extending from the distal transverse colon through the sigmoid colon.  Given underlying diverticula, acute diverticulitis would be the leading differential versus other infectious or inflammatory causes as alternative.  Ischemia favor less likely.  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  She was treated with empiric antibiotics.  C. difficile, GI pathogen panel, stool culture negative.  Hemoglobin declined from 14.8 on admission to 11.2, then remained stable at 11.0 on day of discharge.  Ultimately, rectal bleeding tapered off and clinically, patient was feeling improved.  She was advised to complete 7-day course of antibiotics and to have outpatient colonoscopy.  Today:  Feeling well overall. Abdominal pain has resolved. No recurrent rectal bleeding. Bowels are moving well.   For the last couple of months, she has been experiencing solid food dysphagia with intermittent regurgitation. Also with chronic GERD. Omeprazole is causing headaches, so not taking daily. Taking every couple of days when reflux comes on.    Brother with history of throat cancer.   Last colonoscopy was in 2019 with pancolonic diverticulosis, one 5 mm polyp (leiomyoma).   Last EGD in 2014 with  empiric dilation.   She has not had any unintentional weight loss.  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 Cannot tolerate   Spinal stenosis    Spinal stenosis 02/14/2019   Stroke [facility])    no deficits    Past Surgical History:  Procedure Laterality Date   BACK SURGERY     CARPAL TUNNEL RELEASE Right    COLONOSCOPY N/A 05/29/2013   Focal left colonic inflammation, likely remnant of recent bout of ischemic or segmental colitis, s/p biopsy. pancolonic diverticulosis. Due for surveillance in 5 years   COLONOSCOPY WITH PROPOFOL N/A 07/26/2018   Procedure: COLONOSCOPY WITH PROPOFOL;  Surgeon: Corbin Ade, MD;  Location: [facility];  Service: Endoscopy;  Laterality: N/A;  8:30am   ESOPHAGOGASTRODUODENOSCOPY (EGD) WITH ESOPHAGEAL DILATION N/A 05/29/2013   normal appearing patent tubular esophagus, small hiatal hernia, multiple antral erosions. no ulcer. normlal duodenum. empiric dilation   ILIOTIBIAL BAND RELEASE Left    NECK SURGERY     POLYPECTOMY  07/26/2018   Procedure: POLYPECTOMY;  Surgeon: Corbin Ade, MD;  Location: [facility];  Service: Endoscopy;;  hepatic flexure polyp   TOTAL ABDOMINAL HYSTERECTOMY      Current Outpatient Medications  Medication Sig Dispense Refill   acetaminophen (TYLENOL) 500 MG tablet Take 500 mg by mouth 2 (two) times daily as needed for mild pain or moderate pain.     Ascorbic Acid (VITAMIN C) 1000 MG tablet Take 1,000-2,000 mg by mouth daily.      atenolol (TENORMIN) 100 MG  tablet Take 100 mg by mouth every morning.     Cholecalciferol (VITAMIN D) 125 MCG (5000 UT) CAPS Take 1 capsule by mouth daily.      citalopram (CELEXA) 20 MG tablet Take 20 mg by mouth every other day.     clopidogrel (PLAVIX) 75 MG tablet Take 75 mg by mouth daily.     cyclobenzaprine (FLEXERIL) 10 MG tablet Take 10 mg by mouth 3 (three) times daily as needed for muscle spasms.     folic acid (FOLVITE) 800 MCG tablet Take 800 mcg by mouth daily.      HYDROcodone-acetaminophen (NORCO/VICODIN) 5-325 MG tablet Take 1 tablet by mouth every 4 (four) hours as needed for moderate pain.   0   levothyroxine (SYNTHROID) 100 MCG tablet Take 88 mcg by mouth daily before breakfast.     lisinopril (ZESTRIL) 20 MG tablet Take 20 mg by mouth daily.     metFORMIN (GLUCOPHAGE) 1000 MG tablet Take 1,000 mg by mouth daily with breakfast.     NONFORMULARY OR COMPOUNDED ITEM Apply 1 application topically 3 (three) times daily as needed (Pain). Diclofenac, Baclofen, Gabapentin, Lidocaine, Menthol - Compounded at [facility]     RABEprazole (ACIPHEX) 20 MG tablet Take 1 tablet (20 mg total) by mouth daily. 30 tablet 3   vitamin B-12 (CYANOCOBALAMIN) 1000 MCG tablet Take 1,000 mcg by mouth daily.     No current facility-administered medications for this visit.    Allergies as of 08/03/2023 - Review Complete 08/03/2023  Allergen Reaction Noted   Celecoxib Swelling 11/28/2018   Codeine Other (See Comments) 08/04/2009   Dilaudid [hydromorphone hcl] Other (See Comments) 05/06/2013   Morphine Nausea And Vomiting 08/04/2009   Statins  11/28/2018   Tetracycline Nausea And Vomiting 08/04/2009    Family History  Problem Relation Age of Onset   Heart disease Mother        MI   Heart disease Father    Heart attack Father 59   Heart disease Brother    Cancer Brother        ADRENAL GLAND   Colon polyps Brother    Liver cancer Brother 65       deceased , ?metastatic cancer?    Heart disease Maternal  Grandfather        MI   Cancer Paternal Grandfather        LUNG   Colon cancer Neg Hx     Social History   Socioeconomic History   Marital status: Widowed    Spouse name: Not on file   Number of children: 2   Years of education: GED   Highest education level: Not on file  Occupational History   Occupation: Retired    Comment: [employer](  Tobacco Use   Smoking status: Former    Current packs/day: 0.00    Average packs/day: 2.0 packs/day for 13.0 years (26.0 ttl pk-yrs)    Types: Cigarettes    Start date: 07/19/1953    Quit date: 07/19/1966    Years since  quitting: 57.0   Smokeless tobacco: Former    Types: Associate Professor status: Not on file  Substance and Sexual Activity   Alcohol use: No   Drug use: No   Sexual activity: Not Currently    Birth control/protection: Surgical  Other Topics Concern   Not on file  Social History Narrative   Lives with son   Caffeine use: Coffee daily   Right handed   No regular exercise   Social Determinants of Health   Financial Resource Strain: Not on file  Food Insecurity: No Food Insecurity (07/10/2023)   Hunger Vital Sign    Worried About Running Out of Food in the Last Year: Never true    Ran Out of Food in the Last Year: Never true  Transportation Needs: No Transportation Needs (07/10/2023)   PRAPARE - Administrator, Civil Service (Medical): No    Lack of Transportation (Non-Medical): No  Physical Activity: Not on file  Stress: Not on file  Social Connections: Not on file    Review of Systems: Gen: Denies fever, chills, cold or flu like symptoms, pre-syncope, or syncope.   CV: Denies chest pain, palpitations. Resp: Denies dyspnea, cough. GI: See HPI Heme: See HPI  Physical Exam: BP 128/81 (BP Location: Left Arm, Patient Position: Sitting, Cuff Size: Normal)   Pulse 72   Temp 97.8 F (36.6 C) (Temporal)   Ht 5\' 5"  (1.651 m)   Wt 173 lb 6.4 oz (78.7 kg)   SpO2 97%   BMI 28.86  kg/m  General:   Alert and oriented. No distress noted. Pleasant and cooperative.  Head:  Normocephalic and atraumatic. Eyes:  Conjuctiva clear without scleral icterus. Heart:  S1, S2 present without murmurs appreciated. Lungs:  Clear to auscultation bilaterally. No wheezes, rales, or rhonchi. No distress.  Abdomen:  +BS, soft, non-tender and non-distended. No rebound or guarding. No HSM or masses noted. Msk:  Symmetrical without gross deformities. Normal posture. Extremities:  Without edema. Neurologic:  Alert and  oriented x4 Psych:  Normal mood and affect.    Assessment:  77 year old female with history of prior CVA, DM, diverticulosis, GERD, OSA, sleep apnea, ischemic versus segmental colitis in January 2020, recent admission in August 2024 with colitis, presenting today for hospital follow-up.  History of colitis with rectal bleeding and subsequent anemia: Recently admitted in August 2024 after presenting to the hospital with abdominal pain and rectal bleeding.  CT angio A/P with and without contrast showed acute colitis in the left colon extending from the distal transverse colon through the sigmoid colon.  Query ischemic colitis versus diverticular etiology.  Her stool testing came back negative.  She was treated empirically with antibiotics with improvement in abdominal pain and rectal bleeding tapered off spontaneously.  Her hemoglobin did decline from 14.8-11.2.  Clinically, she continues to feel well with no recurrent abdominal pain or rectal bleeding.  She needs colonoscopy to follow-up on this recent episode of colitis as her last colonoscopy was in 2019 showing pancolonic diverticulosis and one 5 mm polyp (leiomyoma).  Will also update CBC to ensure hemoglobin is improving.  GERD: Having reflux symptoms every couple of days taking omeprazole when needed as daily use causes her to have headaches.  We will try changing omeprazole to rabeprazole to see if she can take this on a daily  basis without adverse reactions .Notably, PPI options are limited due to Plavix.  Dysphagia: Couple month history of solid food dysphagia with  intermittent regurgitation.  Also with chronic GERD which has not been adequately controlled recently and could be contributing to her symptoms.  Unable to rule out esophageal web, ring, stricture, or malignancy.  Family history significant for brother with throat cancer.  Recommend EGD for further evaluation.   Plan:  CBC Proceed with upper endoscopy + colonoscopy with propofol by Dr. Jena Gauss in near future. The risks, benefits, and alternatives have been discussed  ith the patient in detail. The patient states understanding and desires to proceed.  ASA 3 One half dose of metformin day prior to procedures. No morning diabetes medications day of procedures. Stop omeprazole and start rabeprazole 20 mg daily. Reinforced GERD diet/lifestyle.  Written instructions provided on AVS. Swallowing precautions discussed.  Written directions provided on AVS. Follow-up after procedures.   Ermalinda Memos, PA-C [facility] 08/03/2023

## 2023-08-03 ENCOUNTER — Ambulatory Visit (INDEPENDENT_AMBULATORY_CARE_PROVIDER_SITE_OTHER): Payer: Medicare HMO | Admitting: Gastroenterology

## 2023-08-03 ENCOUNTER — Encounter: Payer: Self-pay | Admitting: Gastroenterology

## 2023-08-03 VITALS — BP 128/81 | HR 72 | Temp 97.8°F | Ht 65.0 in | Wt 173.4 lb

## 2023-08-03 DIAGNOSIS — D649 Anemia, unspecified: Secondary | ICD-10-CM

## 2023-08-03 DIAGNOSIS — Z8719 Personal history of other diseases of the digestive system: Secondary | ICD-10-CM

## 2023-08-03 DIAGNOSIS — R1310 Dysphagia, unspecified: Secondary | ICD-10-CM | POA: Diagnosis not present

## 2023-08-03 DIAGNOSIS — K219 Gastro-esophageal reflux disease without esophagitis: Secondary | ICD-10-CM | POA: Diagnosis not present

## 2023-08-03 MED ORDER — RABEPRAZOLE SODIUM 20 MG PO TBEC: 20.0000 mg | DELAYED_RELEASE_TABLET | Freq: Every day | ORAL | 3 refills | Status: DC

## 2023-08-03 NOTE — Patient Instructions (Addendum)
Please have blood work completed at [facility]  We will get you scheduled for an upper endoscopy with possible stretching of your esophagus and colonoscopy in the near future with Dr. Jena Gauss.  Stop omeprazole and start rabeprazole 20 mg daily for acid reflux symptoms.  If you have any trouble with rabeprazole, please let me know.  Follow a GERD diet:  Avoid fried, fatty, greasy, spicy, citrus foods. Avoid caffeine and carbonated beverages. Avoid chocolate. Try eating 4-6 small meals a day rather than 3 large meals. Do not eat within 3 hours of laying down. Prop head of bed up on wood or bricks to create a 6 inch incline.  Swallowing precautions:  Eat slowly, take small bites, chew thoroughly, drink plenty of liquids throughout meals.  Avoid trough textures All meats should be chopped finely.  If something gets hung in your esophagus and will not come up or go down, proceed to the emergency room.    We will see back in the office after your procedures.  Do not hesitate to call sooner if you have questions or concerns.  It was great to see you today!  I am glad you are feeling better overall!  Ermalinda Memos, PA-C [facility]

## 2023-08-06 IMAGING — DX DG HIP (WITH OR WITHOUT PELVIS) 2-3V*L*
3 series · 3 of 3 positions shown · non-contrast
Comparison: None.

CLINICAL DATA: Pain left hip

EXAM:
DG HIP (WITH OR WITHOUT PELVIS) 2-3V LEFT

[pelvis ap]
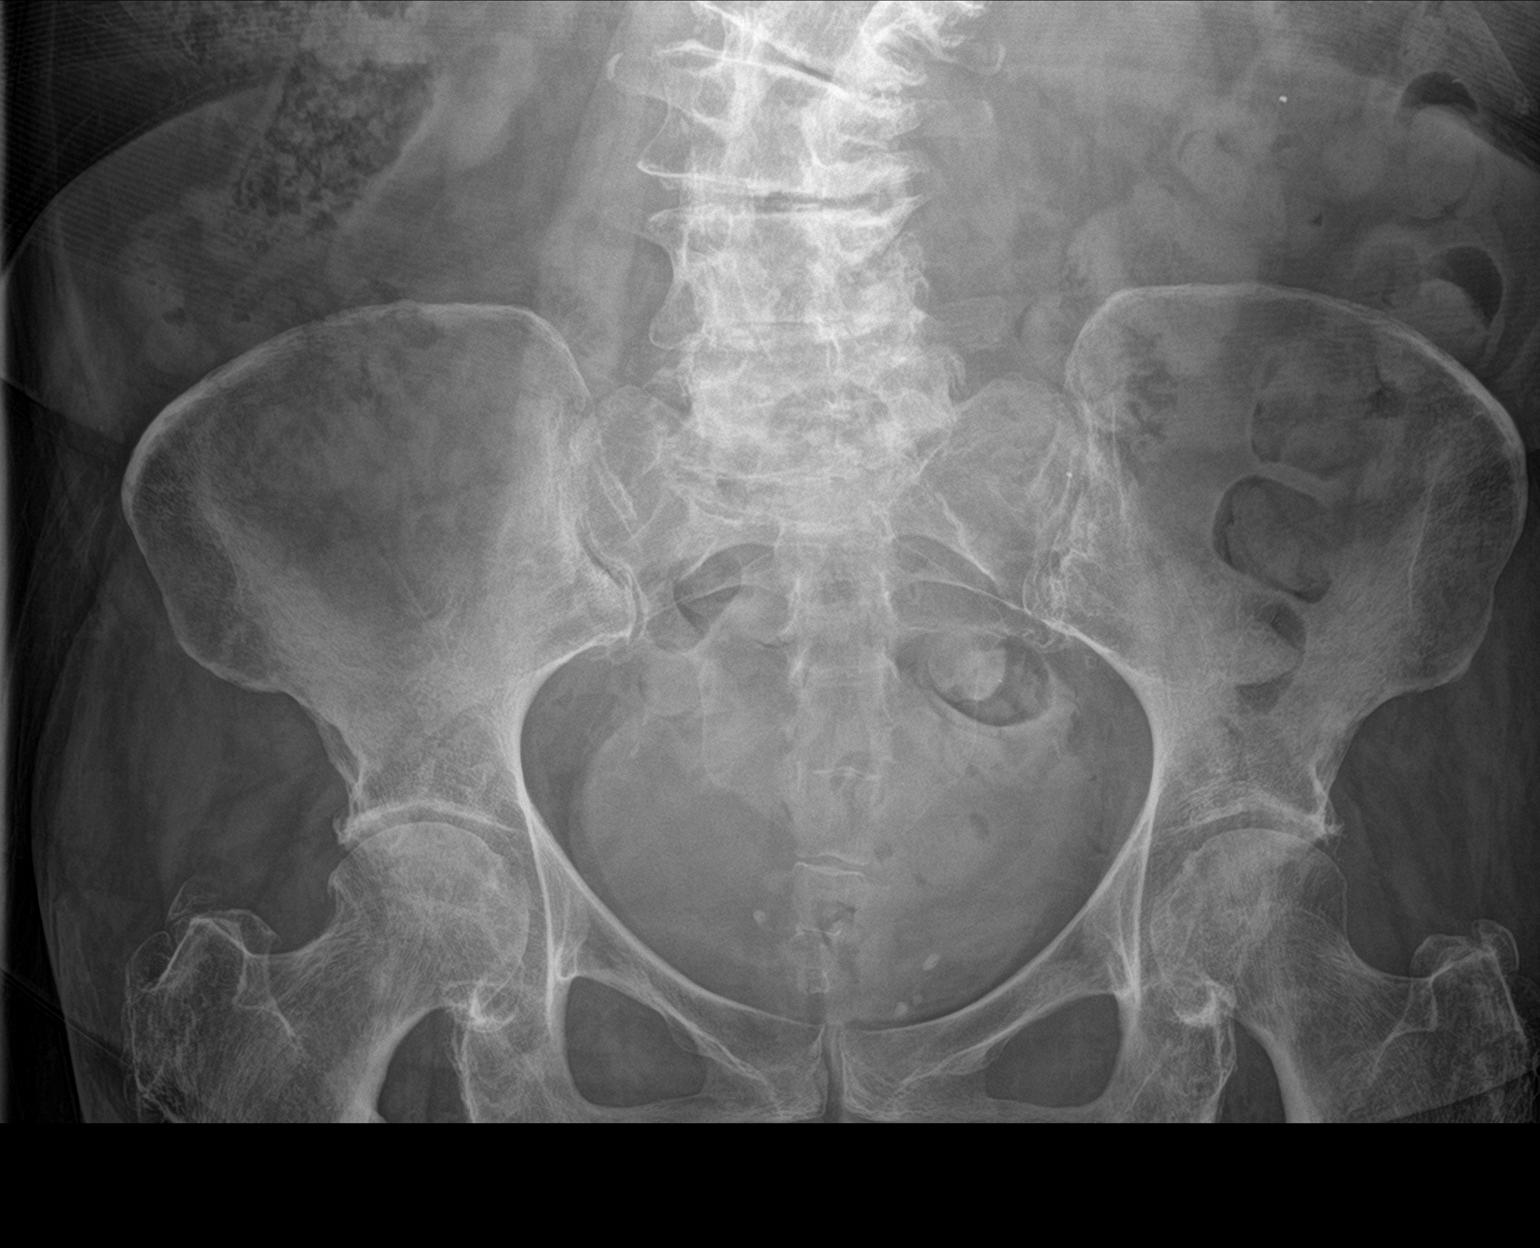

[hip ap]
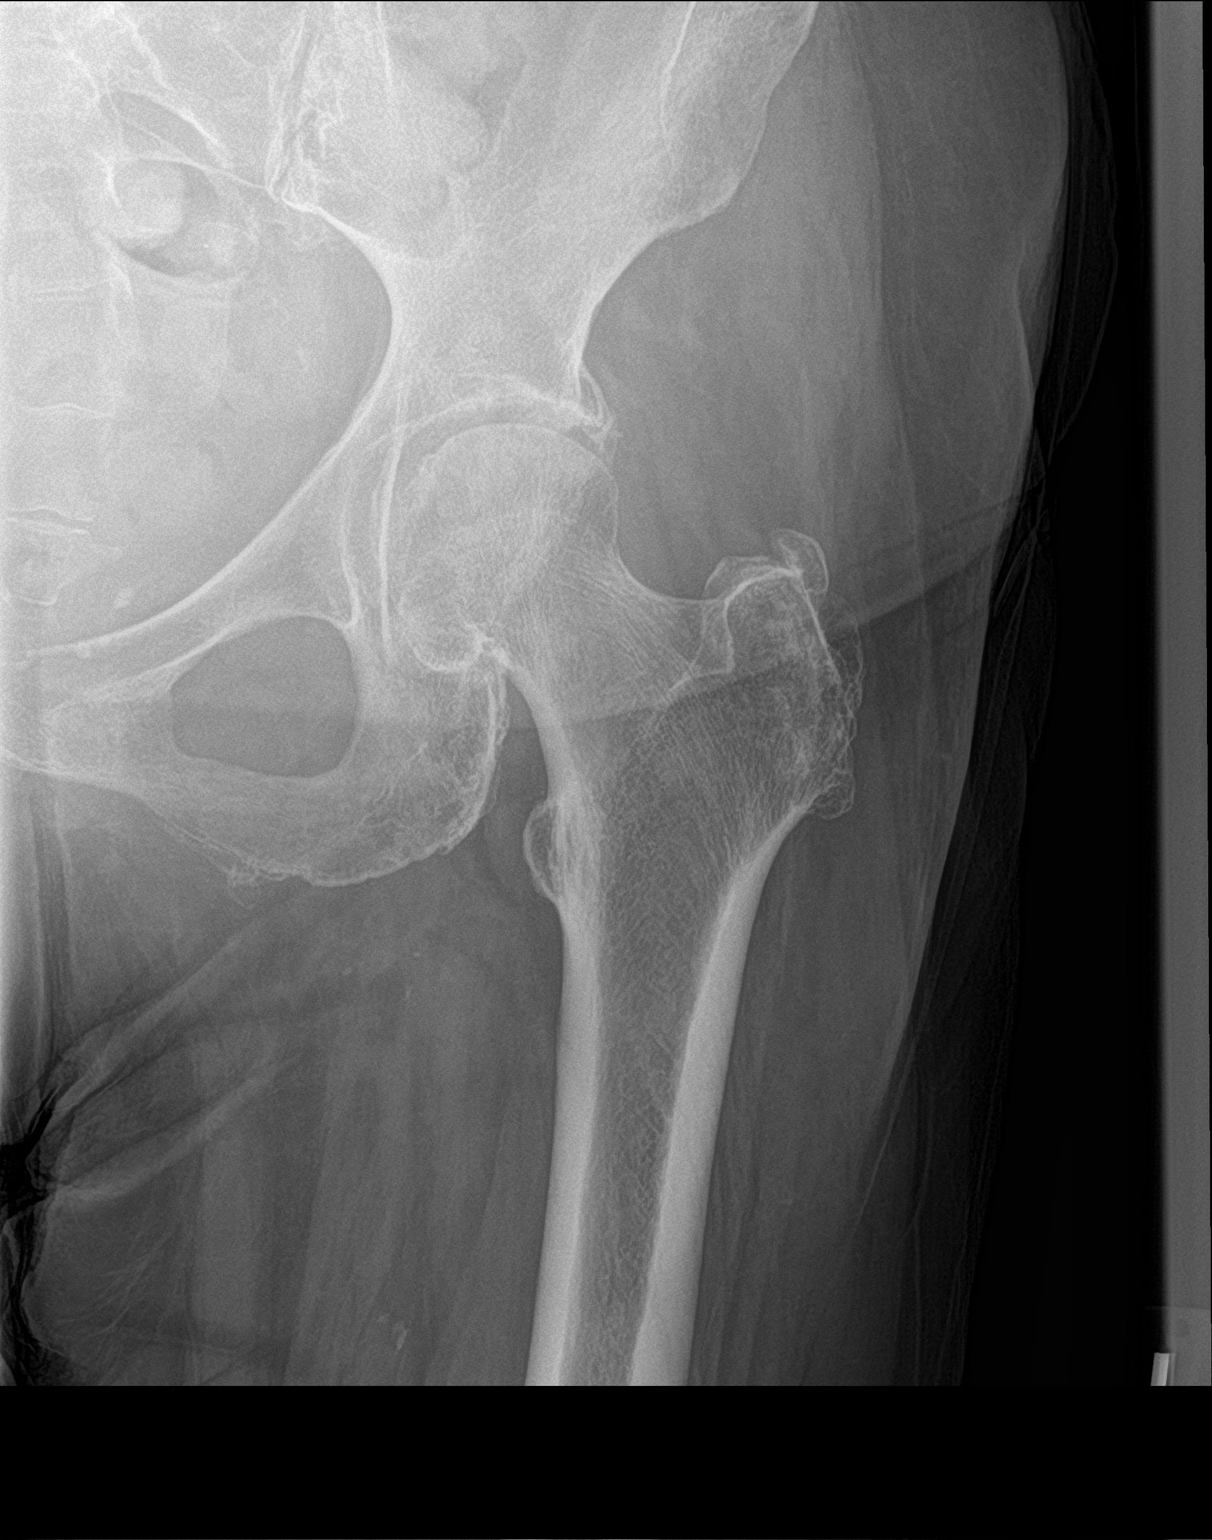

[hip lat]
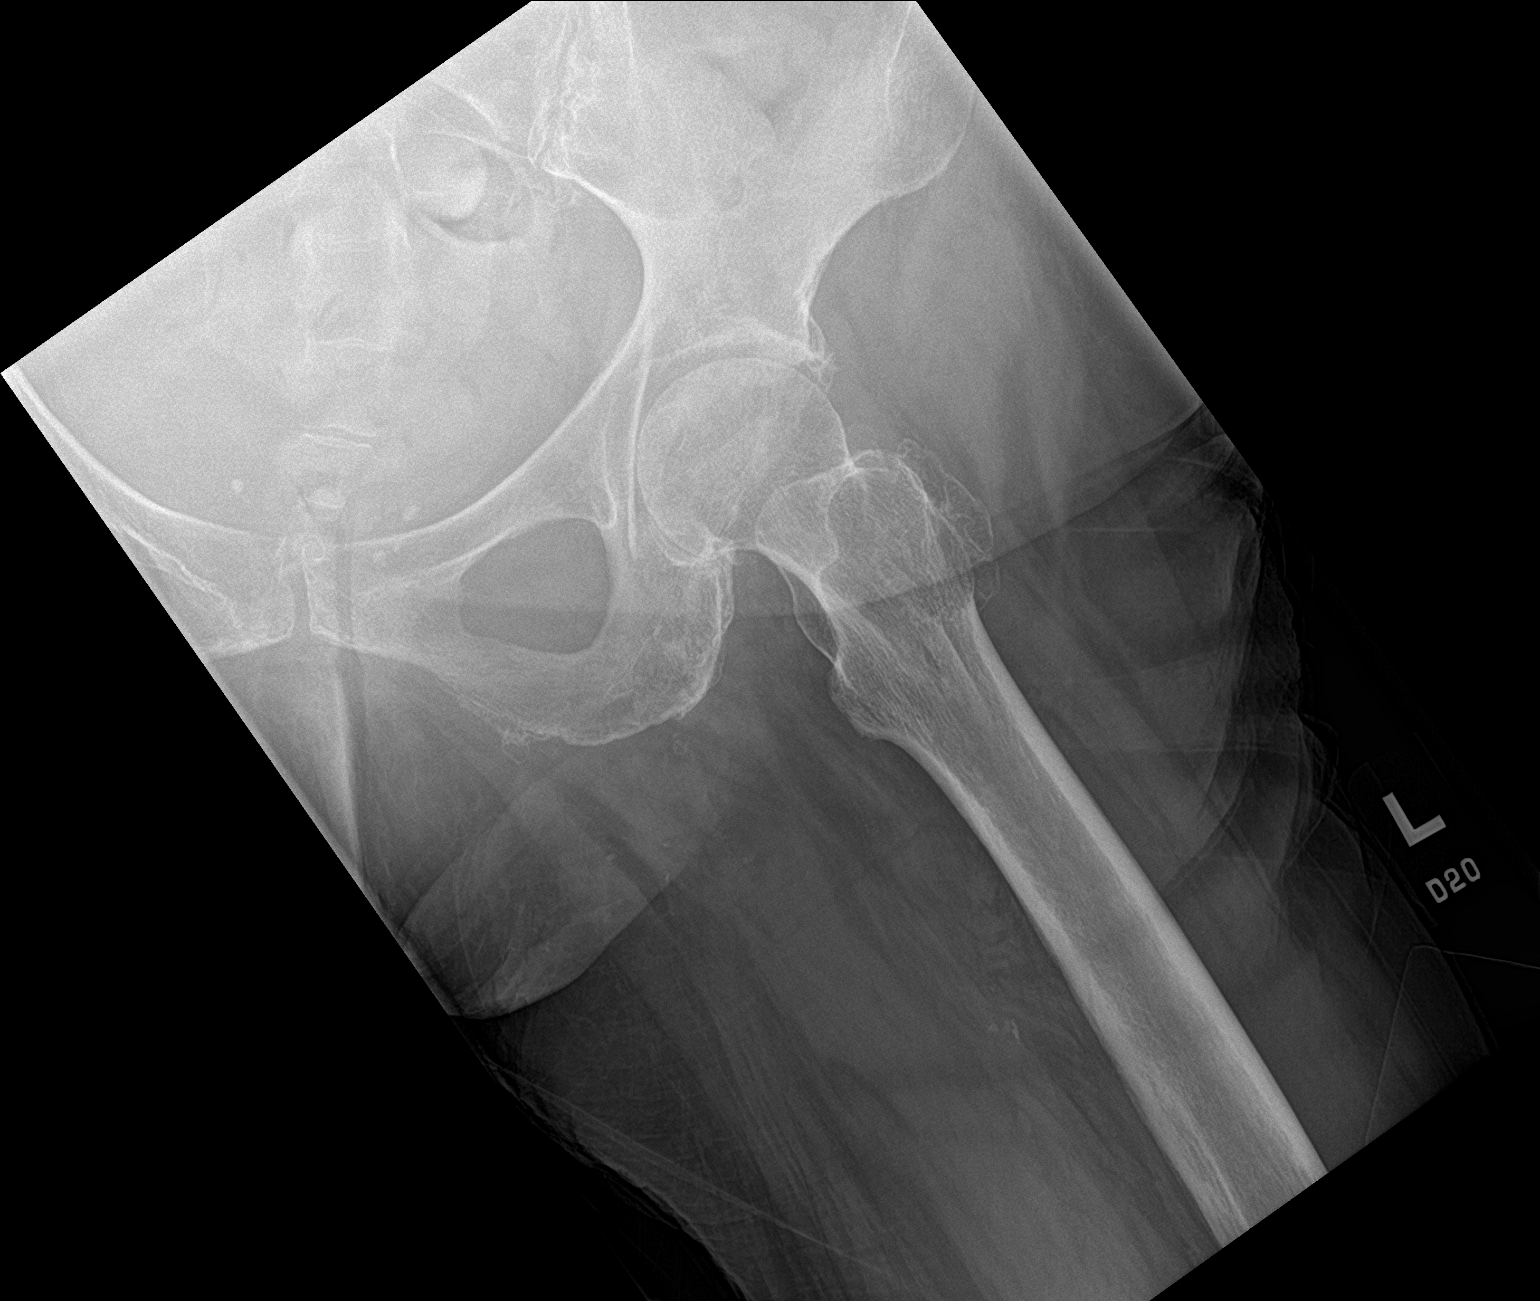

[3 of 3 positions shown; findings below may reference images not displayed]

FINDINGS: No recent fracture or dislocation is seen. Degenerative changes are
noted with small bony spurs in both hips. There are smooth
marginated calcifications in the region of greater trochanter in
both proximal femurs, possibly suggesting previous soft tissue
injury. Marked degenerative changes are noted in the visualized
lower lumbar spine.
IMPRESSION: No recent fracture or dislocation is seen in the left hip. Lumbar
spondylosis. Degenerative changes are noted in both hips.

## 2023-08-07 ENCOUNTER — Telehealth: Payer: Self-pay | Admitting: *Deleted

## 2023-08-07 MED ORDER — PEG 3350-KCL-NA BICARB-NACL 420 G PO SOLR: 4000.0000 mL | Freq: Once | ORAL | 0 refills | Status: AC

## 2023-08-07 NOTE — Telephone Encounter (Signed)
Spoke with pt. Scheduled for TCS/EGD +/- ED with Dr. Jena Gauss ASA 3 10/30. Advised will send rx for prep to [facility]. Will send instructions/pre-op to her.

## 2023-08-08 ENCOUNTER — Other Ambulatory Visit (HOSPITAL_COMMUNITY): Payer: Self-pay | Admitting: Family Medicine

## 2023-08-08 ENCOUNTER — Encounter: Payer: Self-pay | Admitting: *Deleted

## 2023-08-08 DIAGNOSIS — K5732 Diverticulitis of large intestine without perforation or abscess without bleeding: Secondary | ICD-10-CM | POA: Diagnosis not present

## 2023-08-08 DIAGNOSIS — I739 Peripheral vascular disease, unspecified: Secondary | ICD-10-CM

## 2023-08-08 DIAGNOSIS — Z6829 Body mass index (BMI) 29.0-29.9, adult: Secondary | ICD-10-CM | POA: Diagnosis not present

## 2023-08-08 DIAGNOSIS — I639 Cerebral infarction, unspecified: Secondary | ICD-10-CM | POA: Diagnosis not present

## 2023-08-08 DIAGNOSIS — Z23 Encounter for immunization: Secondary | ICD-10-CM | POA: Diagnosis not present

## 2023-08-08 DIAGNOSIS — D649 Anemia, unspecified: Secondary | ICD-10-CM | POA: Diagnosis not present

## 2023-08-08 DIAGNOSIS — G2581 Restless legs syndrome: Secondary | ICD-10-CM | POA: Diagnosis not present

## 2023-08-08 DIAGNOSIS — Z8719 Personal history of other diseases of the digestive system: Secondary | ICD-10-CM | POA: Diagnosis not present

## 2023-08-08 DIAGNOSIS — K5730 Diverticulosis of large intestine without perforation or abscess without bleeding: Secondary | ICD-10-CM | POA: Diagnosis not present

## 2023-08-08 DIAGNOSIS — E663 Overweight: Secondary | ICD-10-CM | POA: Diagnosis not present

## 2023-08-08 DIAGNOSIS — E1159 Type 2 diabetes mellitus with other circulatory complications: Secondary | ICD-10-CM | POA: Diagnosis not present

## 2023-08-08 NOTE — Telephone Encounter (Signed)
PA approved via [employer]here Authorization #[id number]  DOS: 09/20/2023 - 11/20/2023

## 2023-08-11 ENCOUNTER — Ambulatory Visit (HOSPITAL_COMMUNITY)
Admission: RE | Admit: 2023-08-11 | Discharge: 2023-08-11 | Disposition: A | Discharge status: Home / Self Care | Payer: Medicare HMO | Source: Ambulatory Visit | Attending: Family Medicine | Admitting: Family Medicine

## 2023-08-11 DIAGNOSIS — I739 Peripheral vascular disease, unspecified: Secondary | ICD-10-CM | POA: Diagnosis not present

## 2023-08-15 ENCOUNTER — Other Ambulatory Visit: Payer: Self-pay | Admitting: Internal Medicine

## 2023-09-01 DIAGNOSIS — G44309 Post-traumatic headache, unspecified, not intractable: Secondary | ICD-10-CM | POA: Diagnosis not present

## 2023-09-01 DIAGNOSIS — G43909 Migraine, unspecified, not intractable, without status migrainosus: Secondary | ICD-10-CM | POA: Diagnosis not present

## 2023-09-01 DIAGNOSIS — Z6829 Body mass index (BMI) 29.0-29.9, adult: Secondary | ICD-10-CM | POA: Diagnosis not present

## 2023-09-01 DIAGNOSIS — Z23 Encounter for immunization: Secondary | ICD-10-CM | POA: Diagnosis not present

## 2023-09-01 DIAGNOSIS — G44329 Chronic post-traumatic headache, not intractable: Secondary | ICD-10-CM | POA: Diagnosis not present

## 2023-09-01 DIAGNOSIS — E663 Overweight: Secondary | ICD-10-CM | POA: Diagnosis not present

## 2023-09-15 NOTE — Patient Instructions (Signed)
Brittany Goodman  09/15/2023     @PREFPERIOPPHARMACY @   Your procedure is scheduled on  09/20/2023.   Report to [facility] at  [phone]  A.M.   Call this number if you have problems the morning of surgery:  [phone]  If you experience any cold or flu symptoms such as cough, fever, chills, shortness of breath, etc. between now and your scheduled surgery, please notify us at the above number.   Remember:  Follow the diet and prep instructions given to you from the office.   You may drink clear liquids until 0400 am in 09/20/2023.   Clear liquids allowed are:                    Water, Carbonated beverages (diabetics please choose diet or no sugar options), Black Coffee Only (No creamer, milk or cream, including half & half and powdered creamer), and Clear Sports drink (No red color; diabetics please choose diet or no sugar options)    Take these medicines the morning of surgery with A SIP OF WATER       atenolol, citalopram, flexeril(if needed), hydrocodone(if needed), levothyroxine, rabeprazole.     Do not wear jewelry, make-up or nail polish, including gel polish,  artificial nails, or any other type of covering on natural nails (fingers and  toes).  Do not wear lotions, powders, or perfumes, or deodorant.  Do not shave 48 hours prior to surgery.  Men may shave face and neck.  Do not bring valuables to the hospital.  [facility] is not responsible for any belongings or valuables.  Contacts, dentures or bridgework may not be worn into surgery.  Leave your suitcase in the car.  After surgery it may be brought to your room.  For patients admitted to the hospital, discharge time will be determined by your treatment team.  Patients discharged the day of surgery will not be allowed to drive home and must have someone with them for 24 hours.    Special instructions:   DO NOT smoke tobacco or vape for 24 hours before your procedure.  Please read over the following  fact sheets that you were given. Anesthesia Post-op Instructions and Care and Recovery After Surgery      Upper Endoscopy, Adult, Care After After the procedure, it is common to have a sore throat. It is also common to have: Mild stomach pain or discomfort. Bloating. Nausea. Follow these instructions at home: The instructions below may help you care for yourself at home. Your health care provider may give you more instructions. If you have questions, ask your health care provider. If you were given a sedative during the procedure, it can affect you for several hours. Do not drive or operate machinery until your health care provider says that it is safe. If you will be going home right after the procedure, plan to have a responsible adult: Take you home from the hospital or clinic. You will not be allowed to drive. Care for you for the time you are told. Follow instructions from your health care provider about what you may eat and drink. Return to your normal activities as told by your health care provider. Ask your health care provider what activities are safe for you. Take over-the-counter and prescription medicines only as told by your health care provider. Contact a health care provider if you: Have a sore throat that lasts longer than one day. Have trouble  swallowing. Have a fever. Get help right away if you: Vomit blood or your vomit looks like coffee grounds. Have bloody, black, or tarry stools. Have a very bad sore throat or you cannot swallow. Have difficulty breathing or very bad pain in your chest or abdomen. These symptoms may be an emergency. Get help right away. Call 911. Do not wait to see if the symptoms will go away. Do not drive yourself to the hospital. Summary After the procedure, it is common to have a sore throat, mild stomach discomfort, bloating, and nausea. If you were given a sedative during the procedure, it can affect you for several hours. Do not drive  until your health care provider says that it is safe. Follow instructions from your health care provider about what you may eat and drink. Return to your normal activities as told by your health care provider. This information is not intended to replace advice given to you by your health care provider. Make sure you discuss any questions you have with your health care provider. Document Revised: 02/16/2022 Document Reviewed: 02/16/2022 Elsevier Patient Education  2024 [employer] Esophageal Dilatation Esophageal dilatation, also called esophageal dilation, is a procedure to widen or open a blocked or narrowed part of the esophagus. The esophagus is the part of the body that moves food and liquid from the mouth to the stomach. You may need this procedure if: You have a buildup of scar tissue in your esophagus that makes it difficult, painful, or impossible to swallow. This can be caused by gastroesophageal reflux disease (GERD). You have cancer of the esophagus. There is a problem with how food moves through your esophagus. In some cases, you may need this procedure repeated at a later time to dilate the esophagus gradually. Tell a health care provider about: Any allergies you have. All medicines you are taking, including vitamins, herbs, eye drops, creams, and over-the-counter medicines. Any problems you or family members have had with anesthetic medicines. Any blood disorders you have. Any surgeries you have had. Any medical conditions you have. Any antibiotic medicines you are required to take before dental procedures. Whether you are pregnant or may be pregnant. What are the risks? Generally, this is a safe procedure. However, problems may occur, including: Bleeding due to a tear in the lining of the esophagus. A hole, or perforation, in the esophagus. What happens before the procedure? Ask your health care provider about: Changing or stopping your regular medicines. This is  especially important if you are taking diabetes medicines or blood thinners. Taking medicines such as aspirin and ibuprofen. These medicines can thin your blood. Do not take these medicines unless your health care provider tells you to take them. Taking over-the-counter medicines, vitamins, herbs, and supplements. Follow instructions from your health care provider about eating or drinking restrictions. Plan to have a responsible adult take you home from the hospital or clinic. Plan to have a responsible adult care for you for the time you are told after you leave the hospital or clinic. This is important. What happens during the procedure? You may be given a medicine to help you relax (sedative). A numbing medicine may be sprayed into the back of your throat, or you may gargle the medicine. Your health care provider may perform the dilatation using various surgical instruments, such as: Simple dilators. This instrument is carefully placed in the esophagus to stretch it. Guided wire bougies. This involves using an endoscope to insert a wire into the esophagus. A  dilator is passed over this wire to enlarge the esophagus. Then the wire is removed. Balloon dilators. An endoscope with a small balloon is inserted into the esophagus. The balloon is inflated to stretch the esophagus and open it up. The procedure may vary among health care providers and hospitals. What can I expect after the procedure? Your blood pressure, heart rate, breathing rate, and blood oxygen level will be monitored until you leave the hospital or clinic. Your throat may feel slightly sore and numb. This will get better over time. You will not be allowed to eat or drink until your throat is no longer numb. When you are able to drink, urinate, and sit on the edge of the bed without nausea or dizziness, you may be able to return home. Follow these instructions at home: Take over-the-counter and prescription medicines only as told by  your health care provider. If you were given a sedative during the procedure, it can affect you for several hours. Do not drive or operate machinery until your health care provider says that it is safe. Plan to have a responsible adult care for you for the time you are told. This is important. Follow instructions from your health care provider about any eating or drinking restrictions. Do not use any products that contain nicotine or tobacco, such as cigarettes, e-cigarettes, and chewing tobacco. If you need help quitting, ask your health care provider. Keep all follow-up visits. This is important. Contact a health care provider if: You have a fever. You have pain that is not relieved by medicine. Get help right away if: You have chest pain. You have trouble breathing. You have trouble swallowing. You vomit blood. You have black, tarry, or bloody stools. These symptoms may represent a serious problem that is an emergency. Do not wait to see if the symptoms will go away. Get medical help right away. Call your local emergency services (911 in the [place] Do not drive yourself to the hospital. Summary Esophageal dilatation, also called esophageal dilation, is a procedure to widen or open a blocked or narrowed part of the esophagus. Plan to have a responsible adult take you home from the hospital or clinic. For this procedure, a numbing medicine may be sprayed into the back of your throat, or you may gargle the medicine. Do not drive or operate machinery until your health care provider says that it is safe. This information is not intended to replace advice given to you by your health care provider. Make sure you discuss any questions you have with your health care provider. Document Revised: 03/25/2020 Document Reviewed: 03/25/2020 Elsevier Patient Education  2024 [employer]. Colonoscopy, Adult, Care After The following information offers guidance on how to care for yourself after your  procedure. Your health care provider may also give you more specific instructions. If you have problems or questions, contact your health care provider. What can I expect after the procedure? After the procedure, it is common to have: A small amount of blood in your stool for 24 hours after the procedure. Some gas. Mild cramping or bloating of your abdomen. Follow these instructions at home: Eating and drinking  Drink enough fluid to keep your urine pale yellow. Follow instructions from your health care provider about eating or drinking restrictions. Resume your normal diet as told by your health care provider. Avoid heavy or fried foods that are hard to digest. Activity Rest as told by your health care provider. Avoid sitting for a long time without  moving. Get up to take short walks every 1-2 hours. This is important to improve blood flow and breathing. Ask for help if you feel weak or unsteady. Return to your normal activities as told by your health care provider. Ask your health care provider what activities are safe for you. Managing cramping and bloating  Try walking around when you have cramps or feel bloated. If directed, apply heat to your abdomen as told by your health care provider. Use the heat source that your health care provider recommends, such as a moist heat pack or a heating pad. Place a towel between your skin and the heat source. Leave the heat on for 20-30 minutes. Remove the heat if your skin turns bright red. This is especially important if you are unable to feel pain, heat, or cold. You have a greater risk of getting burned. General instructions If you were given a sedative during the procedure, it can affect you for several hours. Do not drive or operate machinery until your health care provider says that it is safe. For the first 24 hours after the procedure: Do not sign important documents. Do not drink alcohol. Do your regular daily activities at a slower pace  than normal. Eat soft foods that are easy to digest. Take over-the-counter and prescription medicines only as told by your health care provider. Keep all follow-up visits. This is important. Contact a health care provider if: You have blood in your stool 2-3 days after the procedure. Get help right away if: You have more than a small spotting of blood in your stool. You have large blood clots in your stool. You have swelling of your abdomen. You have nausea or vomiting. You have a fever. You have increasing pain in your abdomen that is not relieved with medicine. These symptoms may be an emergency. Get help right away. Call 911. Do not wait to see if the symptoms will go away. Do not drive yourself to the hospital. Summary After the procedure, it is common to have a small amount of blood in your stool. You may also have mild cramping and bloating of your abdomen. If you were given a sedative during the procedure, it can affect you for several hours. Do not drive or operate machinery until your health care provider says that it is safe. Get help right away if you have a lot of blood in your stool, nausea or vomiting, a fever, or increased pain in your abdomen. This information is not intended to replace advice given to you by your health care provider. Make sure you discuss any questions you have with your health care provider. Document Revised: 12/20/2022 Document Reviewed: 06/30/2021 Elsevier Patient Education  2024 [employer] Inc. Monitored Anesthesia Care, Care After The following information offers guidance on how to care for yourself after your procedure. Your health care provider may also give you more specific instructions. If you have problems or questions, contact your health care provider. What can I expect after the procedure? After the procedure, it is common to have: Tiredness. Little or no memory about what happened during or after the procedure. Impaired judgment when it  comes to making decisions. Nausea or vomiting. Some trouble with balance. Follow these instructions at home: For the time period you were told by your health care provider:  Rest. Do not participate in activities where you could fall or become injured. Do not drive or use machinery. Do not drink alcohol. Do not take sleeping pills or medicines that  cause drowsiness. Do not make important decisions or sign legal documents. Do not take care of children on your own. Medicines Take over-the-counter and prescription medicines only as told by your health care provider. If you were prescribed antibiotics, take them as told by your health care provider. Do not stop using the antibiotic even if you start to feel better. Eating and drinking Follow instructions from your health care provider about what you may eat and drink. Drink enough fluid to keep your urine pale yellow. If you vomit: Drink clear fluids slowly and in small amounts as you are able. Clear fluids include water, ice chips, low-calorie sports drinks, and fruit juice that has water added to it (diluted fruit juice). Eat light and bland foods in small amounts as you are able. These foods include bananas, applesauce, rice, lean meats, toast, and crackers. General instructions  Have a responsible adult stay with you for the time you are told. It is important to have someone help care for you until you are awake and alert. If you have sleep apnea, surgery and some medicines can increase your risk for breathing problems. Follow instructions from your health care provider about wearing your sleep device: When you are sleeping. This includes during daytime naps. While taking prescription pain medicines, sleeping medicines, or medicines that make you drowsy. Do not use any products that contain nicotine or tobacco. These products include cigarettes, chewing tobacco, and vaping devices, such as e-cigarettes. If you need help quitting, ask your  health care provider. Contact a health care provider if: You feel nauseous or vomit every time you eat or drink. You feel light-headed. You are still sleepy or having trouble with balance after 24 hours. You get a rash. You have a fever. You have redness or swelling around the IV site. Get help right away if: You have trouble breathing. You have new confusion after you get home. These symptoms may be an emergency. Get help right away. Call 911. Do not wait to see if the symptoms will go away. Do not drive yourself to the hospital. This information is not intended to replace advice given to you by your health care provider. Make sure you discuss any questions you have with your health care provider. Document Revised: 04/04/2022 Document Reviewed: 04/04/2022 Elsevier Patient Education  2024 [employer].

## 2023-09-18 ENCOUNTER — Encounter (HOSPITAL_COMMUNITY)
Admission: RE | Admit: 2023-09-18 | Discharge: 2023-09-18 | Disposition: A | Discharge status: Home / Self Care | Payer: Medicare HMO | Source: Ambulatory Visit | Attending: Internal Medicine

## 2023-09-18 ENCOUNTER — Encounter (HOSPITAL_COMMUNITY): Payer: Self-pay

## 2023-09-18 VITALS — BP 151/79 | HR 70 | Temp 97.7°F | Resp 18 | Ht 65.0 in | Wt 173.5 lb

## 2023-09-18 DIAGNOSIS — Z01818 Encounter for other preprocedural examination: Secondary | ICD-10-CM | POA: Diagnosis present

## 2023-09-18 DIAGNOSIS — R9431 Abnormal electrocardiogram [ECG] [EKG]: Secondary | ICD-10-CM | POA: Diagnosis not present

## 2023-09-18 DIAGNOSIS — Z01810 Encounter for preprocedural cardiovascular examination: Secondary | ICD-10-CM | POA: Diagnosis not present

## 2023-09-18 DIAGNOSIS — E119 Type 2 diabetes mellitus without complications: Secondary | ICD-10-CM | POA: Insufficient documentation

## 2023-09-18 DIAGNOSIS — I10 Essential (primary) hypertension: Secondary | ICD-10-CM | POA: Diagnosis not present

## 2023-09-20 ENCOUNTER — Ambulatory Visit (HOSPITAL_COMMUNITY)
Admission: RE | Admit: 2023-09-20 | Discharge: 2023-09-20 | Disposition: A | Discharge status: Home / Self Care | Payer: Medicare PPO | Attending: Internal Medicine | Admitting: Internal Medicine

## 2023-09-20 ENCOUNTER — Ambulatory Visit (HOSPITAL_COMMUNITY): Payer: Medicare PPO | Admitting: Anesthesiology

## 2023-09-20 ENCOUNTER — Encounter (HOSPITAL_COMMUNITY)
Admission: RE | Disposition: A | Discharge status: Home / Self Care | Payer: Self-pay | Source: Home / Self Care | Attending: Internal Medicine

## 2023-09-20 ENCOUNTER — Encounter (HOSPITAL_COMMUNITY): Payer: Self-pay | Admitting: Internal Medicine

## 2023-09-20 ENCOUNTER — Telehealth: Payer: Self-pay

## 2023-09-20 DIAGNOSIS — K5730 Diverticulosis of large intestine without perforation or abscess without bleeding: Secondary | ICD-10-CM | POA: Insufficient documentation

## 2023-09-20 DIAGNOSIS — R1310 Dysphagia, unspecified: Secondary | ICD-10-CM | POA: Diagnosis not present

## 2023-09-20 DIAGNOSIS — Z87891 Personal history of nicotine dependence: Secondary | ICD-10-CM | POA: Diagnosis not present

## 2023-09-20 DIAGNOSIS — R933 Abnormal findings on diagnostic imaging of other parts of digestive tract: Secondary | ICD-10-CM | POA: Diagnosis not present

## 2023-09-20 DIAGNOSIS — I10 Essential (primary) hypertension: Secondary | ICD-10-CM | POA: Diagnosis not present

## 2023-09-20 DIAGNOSIS — E119 Type 2 diabetes mellitus without complications: Secondary | ICD-10-CM

## 2023-09-20 DIAGNOSIS — G4730 Sleep apnea, unspecified: Secondary | ICD-10-CM | POA: Diagnosis not present

## 2023-09-20 DIAGNOSIS — K625 Hemorrhage of anus and rectum: Secondary | ICD-10-CM | POA: Diagnosis present

## 2023-09-20 HISTORY — PX: MALONEY DILATION: SHX5535

## 2023-09-20 HISTORY — PX: COLONOSCOPY WITH PROPOFOL: SHX5780

## 2023-09-20 HISTORY — PX: ESOPHAGOGASTRODUODENOSCOPY (EGD) WITH PROPOFOL: SHX5813

## 2023-09-20 LAB — GLUCOSE, CAPILLARY: Glucose-Capillary: 143 mg/dL — ABNORMAL HIGH (ref 70–99)

## 2023-09-20 SURGERY — COLONOSCOPY WITH PROPOFOL
Anesthesia: General

## 2023-09-20 MED ORDER — SODIUM CHLORIDE 0.9% FLUSH: 10.0000 mL | Freq: Two times a day (BID) | INTRAVENOUS | Status: DC

## 2023-09-20 MED ORDER — LIDOCAINE HCL 1 % IJ SOLN
INTRAMUSCULAR | Status: DC | PRN
  Administered 2023-09-20: 50 mg via INTRADERMAL

## 2023-09-20 MED ORDER — PROPOFOL 10 MG/ML IV BOLUS
INTRAVENOUS | Status: DC | PRN
  Administered 2023-09-20: 10 mg via INTRAVENOUS
  Administered 2023-09-20: 75 mg via INTRAVENOUS
  Administered 2023-09-20: 1 mg via INTRAVENOUS
  Administered 2023-09-20: 25 mg via INTRAVENOUS
  Administered 2023-09-20: 30 mg via INTRAVENOUS

## 2023-09-20 MED ORDER — LACTATED RINGERS IV SOLN: INTRAVENOUS | Status: DC | PRN

## 2023-09-20 MED ORDER — PROPOFOL 500 MG/50ML IV EMUL
INTRAVENOUS | Status: DC | PRN
  Administered 2023-09-20: 150 ug/kg/min via INTRAVENOUS

## 2023-09-20 NOTE — Op Note (Signed)
[facility] Patient Name: Brittany Goodman Procedure Date: 09/20/2023 7:15 AM MRN: [id number] Date of Birth: 03-Aug-1946 Attending MD: Gennette Pac , MD, [id number] CSN: [id number] Age: 77 Admit Type: Outpatient Procedure:                Colonoscopy Indications:              Abnormal CT of the GI tract; rectal bleeding                            -resolved Providers:                Gennette Pac, MD, Edrick Kins, RN,                            Zena Amos Referring MD:              Medicines:                Propofol per Anesthesia Complications:            No immediate complications. Estimated Blood Loss:     Estimated blood loss: none. Procedure:                Pre-Anesthesia Assessment:                           - Prior to the procedure, a History and Physical                            was performed, and patient medications and                            allergies were reviewed. The patient's tolerance of                            previous anesthesia was also reviewed. The risks                            and benefits of the procedure and the sedation                            options and risks were discussed with the patient.                            All questions were answered, and informed consent                            was obtained. Prior Anticoagulants: The patient has                            taken no anticoagulant or antiplatelet agents. ASA                            Grade Assessment: II - A patient with mild systemic  disease. After reviewing the risks and benefits,                            the patient was deemed in satisfactory condition to                            undergo the procedure.                           After obtaining informed consent, the colonoscope                            was passed under direct vision. Throughout the                            procedure, the patient's blood pressure, pulse,  and                            oxygen saturations were monitored continuously. The                            [id number]) scope was introduced through                            the anus and advanced to the the cecum, identified                            by appendiceal orifice and ileocecal valve. The                            colonoscopy was performed without difficulty. The                            patient tolerated the procedure well. The quality                            of the bowel preparation was adequate. Scope In: 7:51:22 AM Scope Out: 8:22:39 AM Scope Withdrawal Time: 0 hours 11 minutes 2 seconds  Total Procedure Duration: 0 hours 31 minutes 17 seconds  Findings:      The perianal and digital rectal examinations were normal.      Many large-mouthed and medium-mouthed diverticula were found in the       entire colon. Noncompliant stiff descending/sigmoid segment. Unable to       negotiate with the adult scope withdrew and obtained the pediatric       colonoscope was able to make it past the left segments. The more       proximal colon was redundant recurrent looping encountered dealt with       with external abdominal pressure. Aside from diverticula throughout her       colon, the mucosa appeared normal. Rectal vault too small to retroflex.       Seen well on?"face. Appeared normal. Impression:               - Diverticulosis in the entire examined colon.  Noncompliant left colon/redundant right colon                           -Based on presentation and CT findings, she likely                            suffered a bout of ischemic or segmental colitis.                            Hopefully will not recur.. Moderate Sedation:      Moderate (conscious) sedation was personally administered by an       anesthesia professional. The following parameters were monitored: oxygen       saturation, heart rate, blood pressure, respiratory rate, EKG,  adequacy       of pulmonary ventilation, and response to care. Recommendation:           - Patient has a contact number available for                            emergencies. The signs and symptoms of potential                            delayed complications were discussed with the                            patient. Return to normal activities tomorrow.                            Written discharge instructions were provided to the                            patient.                           - Advance diet as tolerated.                           - No repeat colonoscopy due to age. See EGD report. Procedure Code(s):        --- Professional ---                           [phone], Colonoscopy, flexible; diagnostic, including                            collection of specimen(s) by brushing or washing,                            when performed (separate procedure) Diagnosis Code(s):        --- Professional ---                           K57.30, Diverticulosis of large intestine without                            perforation or abscess without bleeding  R93.3, Abnormal findings on diagnostic imaging of                            other parts of digestive tract CPT copyright 2022 American Medical Association. All rights reserved. The codes documented in this report are preliminary and upon coder review may  be revised to meet current compliance requirements. [name]. Salam Chesterfield, MD Gennette Pac, MD 09/20/2023 8:28:54 AM This report has been signed electronically. Number of Addenda: 0

## 2023-09-20 NOTE — Anesthesia Preprocedure Evaluation (Signed)
Anesthesia Evaluation  Patient identified by MRN, date of birth, ID band Patient awake    Reviewed: Allergy & Precautions, H&P , NPO status , Patient's Chart, lab work & pertinent test results, reviewed documented beta blocker date and time   Airway Mallampati: II  TM Distance: >3 FB Neck ROM: full    Dental no notable dental hx.    Pulmonary neg pulmonary ROS, shortness of breath, sleep apnea , former smoker   Pulmonary exam normal breath sounds clear to auscultation       Cardiovascular Exercise Tolerance: Good hypertension, negative cardio ROS  Rhythm:regular Rate:Normal     Neuro/Psych  PSYCHIATRIC DISORDERS  Depression    CVA negative neurological ROS  negative psych ROS   GI/Hepatic negative GI ROS, Neg liver ROS, PUD,GERD  ,,  Endo/Other  negative endocrine ROSdiabetesHypothyroidism    Renal/GU negative Renal ROS  negative genitourinary   Musculoskeletal   Abdominal   Peds  Hematology negative hematology ROS (+)   Anesthesia Other Findings   Reproductive/Obstetrics negative OB ROS                             Anesthesia Physical Anesthesia Plan  ASA: 3  Anesthesia Plan: General   Post-op Pain Management:    Induction:   PONV Risk Score and Plan: Propofol infusion  Airway Management Planned:   Additional Equipment:   Intra-op Plan:   Post-operative Plan:   Informed Consent: I have reviewed the patients History and Physical, chart, labs and discussed the procedure including the risks, benefits and alternatives for the proposed anesthesia with the patient or authorized representative who has indicated his/her understanding and acceptance.     Dental Advisory Given  Plan Discussed with: CRNA  Anesthesia Plan Comments:        Anesthesia Quick Evaluation

## 2023-09-20 NOTE — Telephone Encounter (Signed)
Noted  

## 2023-09-20 NOTE — Transfer of Care (Signed)
Immediate Anesthesia Transfer of Care Note  Patient: Brittany Goodman  Procedure(s) Performed: COLONOSCOPY WITH PROPOFOL ESOPHAGOGASTRODUODENOSCOPY (EGD) WITH PROPOFOL MALONEY DILATION  Patient Location: Short Stay  Anesthesia Type:General  Level of Consciousness: awake  Airway & Oxygen Therapy: Patient Spontanous Breathing  Post-op Assessment: Report given to RN  Post vital signs: Reviewed and stable  Last Vitals:  Vitals Value Taken Time  BP    Temp    Pulse    Resp    SpO2      Last Pain:  Vitals:   09/20/23 0736  TempSrc:   PainSc: 7          Complications: No notable events documented.

## 2023-09-20 NOTE — Anesthesia Postprocedure Evaluation (Signed)
Anesthesia Post Note  Patient: Brittany Goodman  Procedure(s) Performed: COLONOSCOPY WITH PROPOFOL ESOPHAGOGASTRODUODENOSCOPY (EGD) WITH PROPOFOL MALONEY DILATION  Patient location during evaluation: Short Stay Anesthesia Type: General Level of consciousness: awake Pain management: pain level controlled Vital Signs Assessment: post-procedure vital signs reviewed and stable Respiratory status: spontaneous breathing Cardiovascular status: stable Postop Assessment: no apparent nausea or vomiting Anesthetic complications: no   No notable events documented.   Last Vitals:  Vitals:   09/20/23 0713  Pulse: 69  Resp: 14  Temp: 36.8 C  SpO2: 100%    Last Pain:  Vitals:   09/20/23 0736  TempSrc:   PainSc: 7                  Brittany Goodman

## 2023-09-20 NOTE — Op Note (Signed)
[facility] Patient Name: Brittany Goodman Procedure Date: 09/20/2023 7:15 AM MRN: [id number] Date of Birth: 1946/09/30 Attending MD: Gennette Pac , MD, [id number] CSN: [id number] Age: 77 Admit Type: Outpatient Procedure:                Upper GI endoscopy Indications:              Dysphagia Providers:                Gennette Pac, MD, Edrick Kins, RN,                            Zena Amos Referring MD:              Medicines:                Propofol per Anesthesia Complications:            No immediate complications. Estimated Blood Loss:     Estimated blood loss was minimal. Procedure:                Pre-Anesthesia Assessment:                           - Prior to the procedure, a History and Physical                            was performed, and patient medications and                            allergies were reviewed. The patient's tolerance of                            previous anesthesia was also reviewed. The risks                            and benefits of the procedure and the sedation                            options and risks were discussed with the patient.                            All questions were answered, and informed consent                            was obtained. Prior Anticoagulants: The patient has                            taken no anticoagulant or antiplatelet agents. ASA                            Grade Assessment: II - A patient with mild systemic                            disease. After reviewing the risks and benefits,  the patient was deemed in satisfactory condition to                            undergo the procedure.                           After obtaining informed consent, the endoscope was                            passed under direct vision. Throughout the                            procedure, the patient's blood pressure, pulse, and                            oxygen saturations were  monitored continuously. The                            GIF-H190 ([id number]) scope was introduced through the                            mouth, and advanced to the second part of duodenum.                            The upper GI endoscopy was accomplished without                            difficulty. The patient tolerated the procedure                            well. Scope In: 7:41:23 AM Scope Out: 7:46:14 AM Total Procedure Duration: 0 hours 4 minutes 51 seconds  Findings:      The examined esophagus was normal. The scope was withdrawn. Dilation was       performed with a Maloney dilator with mild resistance at 54 Fr. The       dilation site was examined following endoscope reinsertion and showed no       change. Estimated blood loss was minimal.      The entire examined stomach was normal.      The duodenal bulb and second portion of the duodenum were normal. Impression:               - Normal esophagus. Dilated.                           - Normal stomach.                           - Normal duodenal bulb and second portion of the                            duodenum.                           - No specimens collected. Moderate Sedation:      Moderate (conscious) sedation was personally administered by  an       anesthesia professional. The following parameters were monitored: oxygen       saturation, heart rate, blood pressure, respiratory rate, EKG, adequacy       of pulmonary ventilation, and response to care. Recommendation:           - Patient has a contact number available for                            emergencies. The signs and symptoms of potential                            delayed complications were discussed with the                            patient. Return to normal activities tomorrow.                            Written discharge instructions were provided to the                            patient.                           - Advance diet as tolerated.                            - Continue present medications. Patient came off                            her PPI. Resume rabeprazole 20 mg orally 30 minutes                            for breakfast.                           - Return to my office in 6 months. See colonoscopy                            report. Procedure Code(s):        --- Professional ---                           [phone], Esophagogastroduodenoscopy, flexible,                            transoral; diagnostic, including collection of                            specimen(s) by brushing or washing, when performed                            (separate procedure)                           43450, Dilation of esophagus, by unguided sound or  bougie, single or multiple passes Diagnosis Code(s):        --- Professional ---                           R13.10, Dysphagia, unspecified CPT copyright 2022 American Medical Association. All rights reserved. The codes documented in this report are preliminary and upon coder review may  be revised to meet current compliance requirements. Brittany Goodman. Brittany Uselman, MD Gennette Pac, MD 09/20/2023 8:24:02 AM This report has been signed electronically. Number of Addenda: 0

## 2023-09-20 NOTE — Telephone Encounter (Signed)
Pt is already on Rabeprazole 20 mg once daily. Please advise.

## 2023-09-20 NOTE — Discharge Instructions (Signed)
EGD Discharge instructions Please read the instructions outlined below and refer to this sheet in the next few weeks. These discharge instructions provide you with general information on caring for yourself after you leave the hospital. Your doctor may also give you specific instructions. While your treatment has been planned according to the most current medical practices available, unavoidable complications occasionally occur. If you have any problems or questions after discharge, please call your doctor. ACTIVITY You may resume your regular activity but move at a slower pace for the next 24 hours.  Take frequent rest periods for the next 24 hours.  Walking will help expel (get rid of) the air and reduce the bloated feeling in your abdomen.  No driving for 24 hours (because of the anesthesia (medicine) used during the test).  You may shower.  Do not sign any important legal documents or operate any machinery for 24 hours (because of the anesthesia used during the test).  NUTRITION Drink plenty of fluids.  You may resume your normal diet.  Begin with a light meal and progress to your normal diet.  Avoid alcoholic beverages for 24 hours or as instructed by your caregiver.  MEDICATIONS You may resume your normal medications unless your caregiver tells you otherwise.  WHAT YOU CAN EXPECT TODAY You may experience abdominal discomfort such as a feeling of fullness or "gas" pains.  FOLLOW-UP Your doctor will discuss the results of your test with you.  SEEK IMMEDIATE MEDICAL ATTENTION IF ANY OF THE FOLLOWING OCCUR: Excessive nausea (feeling sick to your stomach) and/or vomiting.  Severe abdominal pain and distention (swelling).  Trouble swallowing.  Temperature over 101 F (37.8 C).  Rectal bleeding or vomiting of blood.     Colonoscopy Discharge Instructions  Read the instructions outlined below and refer to this sheet in the next few weeks. These discharge instructions provide you with  general information on caring for yourself after you leave the hospital. Your doctor may also give you specific instructions. While your treatment has been planned according to the most current medical practices available, unavoidable complications occasionally occur. If you have any problems or questions after discharge, call Dr. Jena Gauss at [phone]. ACTIVITY You may resume your regular activity, but move at a slower pace for the next 24 hours.  Take frequent rest periods for the next 24 hours.  Walking will help get rid of the air and reduce the bloated feeling in your belly (abdomen).  No driving for 24 hours (because of the medicine (anesthesia) used during the test).   Do not sign any important legal documents or operate any machinery for 24 hours (because of the anesthesia used during the test).  NUTRITION Drink plenty of fluids.  You may resume your normal diet as instructed by your doctor.  Begin with a light meal and progress to your normal diet. Heavy or fried foods are harder to digest and may make you feel sick to your stomach (nauseated).  Avoid alcoholic beverages for 24 hours or as instructed.  MEDICATIONS You may resume your normal medications unless your doctor tells you otherwise.  WHAT YOU CAN EXPECT TODAY Some feelings of bloating in the abdomen.  Passage of more gas than usual.  Spotting of blood in your stool or on the toilet paper.  IF YOU HAD POLYPS REMOVED DURING THE COLONOSCOPY: No aspirin products for 7 days or as instructed.  No alcohol for 7 days or as instructed.  Eat a soft diet for the next 24 hours.  FINDING OUT THE RESULTS OF YOUR TEST Not all test results are available during your visit. If your test results are not back during the visit, make an appointment with your caregiver to find out the results. Do not assume everything is normal if you have not heard from your caregiver or the medical facility. It is important for you to follow up on all of your test  results.  SEEK IMMEDIATE MEDICAL ATTENTION IF: You have more than a spotting of blood in your stool.  Your belly is swollen (abdominal distention).  You are nauseated or vomiting.  You have a temperature over 101.  You have abdominal pain or discomfort that is severe or gets worse throughout the day.     Your EGD was normal.  Your esophagus was stretched today  Resume rabeprazole 20 mg daily 30 minutes before bed breakfast.  New prescription already called in from my office to your pharmacy.   you have diverticulosis throughout your colon.  No evidence of colitis or cancer.  I suspect you did have colitis earlier in the year which resolved   a future colonoscopy is not recommended unless new symptoms develop   office visit with [place] in 6 months   at patient request I called Brittany Goodman at [phone] -  reviewed findings and recommendations

## 2023-09-20 NOTE — H&P (Signed)
@LOGO @   Primary Care Physician:  Assunta Found, MD Primary Gastroenterologist:  Dr. Jena Gauss  Pre-Procedure History & Physical: HPI:  Brittany Goodman is a 77 y.o. female here for further evaluation management of esophageal dysphagia.  Also history of colitis with rectal bleeding previously anemic but now hemoglobin normal lower GI tract symptoms have resolved.  EGD with possible esophageal dilation followed by diagnostic colonoscopy today per plan.  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 Cannot tolerate   Spinal stenosis    Spinal stenosis 02/14/2019   Stroke [facility])    no deficits    Past Surgical History:  Procedure Laterality Date   BACK SURGERY     CARPAL TUNNEL RELEASE Right    COLONOSCOPY N/A 05/29/2013   Focal left colonic inflammation, likely remnant of recent bout of ischemic or segmental colitis, s/p biopsy. pancolonic diverticulosis. Due for surveillance in 5 years   COLONOSCOPY WITH PROPOFOL N/A 07/26/2018   Procedure: COLONOSCOPY WITH PROPOFOL;  Surgeon: Corbin Ade, MD;  Location: [facility];  Service: Endoscopy;  Laterality: N/A;  8:30am   ESOPHAGOGASTRODUODENOSCOPY (EGD) WITH ESOPHAGEAL DILATION N/A 05/29/2013   normal appearing patent tubular esophagus, small hiatal hernia, multiple antral erosions. no ulcer. normlal duodenum. empiric dilation   ILIOTIBIAL BAND RELEASE Left    NECK SURGERY     POLYPECTOMY  07/26/2018   Procedure: POLYPECTOMY;  Surgeon: Corbin Ade, MD;  Location: [facility];   Service: Endoscopy;;  hepatic flexure polyp   TOTAL ABDOMINAL HYSTERECTOMY      Prior to Admission medications   Medication Sig Start Date End Date Taking? Authorizing Provider  atenolol (TENORMIN) 100 MG tablet Take 100 mg by mouth every morning.   Yes [provider]  citalopram (CELEXA) 20 MG tablet Take 20 mg by mouth every other day.   Yes [provider]  clopidogrel (PLAVIX) 75 MG tablet Take 75 mg by mouth daily.   Yes [provider]  cyclobenzaprine (FLEXERIL) 10 MG tablet Take 10 mg by mouth 3 (three) times daily as needed for muscle spasms.   Yes [provider]  levothyroxine (SYNTHROID) 100 MCG tablet Take 88 mcg by mouth daily before breakfast.   Yes [provider]  lisinopril (ZESTRIL) 20 MG tablet Take 20 mg by mouth daily.   Yes [provider]  RABEprazole (ACIPHEX) 20 MG tablet Take 1 tablet (20 mg total) by mouth daily. 08/03/23  Yes Letta Median, PA-C  acetaminophen (TYLENOL) 500 MG tablet Take 500 mg by mouth 2 (two) times daily as needed for mild pain or moderate pain.    [provider]  Ascorbic Acid (VITAMIN C) 1000 MG tablet Take 1,000-2,000 mg by mouth daily.     [provider]  Cholecalciferol (VITAMIN D) 125 MCG (5000 UT) CAPS Take 1 capsule by mouth daily.     [provider]  folic acid (FOLVITE) 800 MCG tablet Take 800 mcg by  mouth daily.     [provider]  HYDROcodone-acetaminophen (NORCO/VICODIN) 5-325 MG tablet Take 1 tablet by mouth every 4 (four) hours as needed for moderate pain.  03/10/17   [provider]  metFORMIN (GLUCOPHAGE) 1000 MG tablet Take 1,000 mg by mouth daily with breakfast. 12/01/16   [provider]  NONFORMULARY OR COMPOUNDED ITEM Apply 1 application topically 3 (three) times daily as needed (Pain). Diclofenac, Baclofen, Gabapentin, Lidocaine, Menthol - Compounded at [employer], Historical, MD  vitamin B-12  (CYANOCOBALAMIN) 1000 MCG tablet Take 1,000 mcg by mouth daily.    [provider]    Allergies as of 08/07/2023 - Review Complete 08/03/2023  Allergen Reaction Noted   Celecoxib Swelling 11/28/2018   Codeine Other (See Comments) 08/04/2009   Dilaudid [hydromorphone hcl] Other (See Comments) 05/06/2013   Morphine Nausea And Vomiting 08/04/2009   Statins  11/28/2018   Tetracycline Nausea And Vomiting 08/04/2009    Family History  Problem Relation Age of Onset   Heart disease Mother        MI   Heart disease Father    Heart attack Father 38   Heart disease Brother    Cancer Brother        ADRENAL GLAND   Colon polyps Brother    Liver cancer Brother 3       deceased , ?metastatic cancer?    Heart disease Maternal Grandfather        MI   Cancer Paternal Grandfather        LUNG   Colon cancer Neg Hx     Social History   Socioeconomic History   Marital status: Widowed    Spouse name: Not on file   Number of children: 2   Years of education: GED   Highest education level: Not on file  Occupational History   Occupation: Retired    Comment: [employer](  Tobacco Use   Smoking status: Former    Current packs/day: 0.00    Average packs/day: 2.0 packs/day for 13.0 years (26.0 ttl pk-yrs)    Types: Cigarettes    Start date: 07/19/1953    Quit date: 07/19/1966    Years since quitting: 57.2   Smokeless tobacco: Former    Types: Associate Professor status: Not on file  Substance and Sexual Activity   Alcohol use: No   Drug use: No   Sexual activity: Not Currently    Birth control/protection: Surgical  Other Topics Concern   Not on file  Social History Narrative   Lives with son   Caffeine use: Coffee daily   Right handed   No regular exercise   Social Determinants of Health   Financial Resource Strain: Not on file  Food Insecurity: No Food Insecurity (07/10/2023)   Hunger Vital Sign    Worried About Running Out of Food in the Last  Year: Never true    Ran Out of Food in the Last Year: Never true  Transportation Needs: No Transportation Needs (07/10/2023)   PRAPARE - Administrator, Civil Service (Medical): No    Lack of Transportation (Non-Medical): No  Physical Activity: Not on file  Stress: Not on file  Social Connections: Not on file  Intimate Partner Violence: Not At Risk (07/10/2023)   Humiliation, Afraid, Rape, and Kick questionnaire    Fear of Current or Ex-Partner: No    Emotionally Abused: No    Physically Abused: No  Sexually Abused: No    Review of Systems: See HPI, otherwise negative ROS  Physical Exam: Pulse 69   Temp 98.2 F (36.8 C) (Oral)   Resp 14   Ht 5\' 5"  (1.651 m)   Wt 78.7 kg   SpO2 100%   BMI 28.87 kg/m  General:   Alert,  Well-developed, well-nourished, pleasant and cooperative in NAD Neck:  Supple; no masses or thyromegaly. No significant cervical adenopathy. Lungs:  Clear throughout to auscultation.   No wheezes, crackles, or rhonchi. No acute distress. Heart:  Regular rate and rhythm; no murmurs, clicks, rubs,  or gallops. Abdomen: Non-distended, normal bowel sounds.  Soft and nontender without appreciable mass or hepatosplenomegaly.   Impression/Plan: 77 year old lady with GERD and recurrent esophageal dysphagia along with recent rectal bleeding and segmental colitis seen on CT here for further evaluation.  I have offered her an EGD with esophageal dilation as feasible/appropriate per plan.  In addition, diagnostic colonoscopy.  The risks, benefits, limitations, imponderables and alternatives regarding both EGD and colonoscopy have been reviewed with the patient. Questions have been answered. All parties agreeable.       Notice: This dictation was prepared with Dragon dictation along with smaller phrase technology. Any transcriptional errors that result from this process are unintentional and may not be corrected upon review.

## 2023-09-20 NOTE — Telephone Encounter (Signed)
-----   Message from Eula Listen sent at 09/20/2023  8:35 AM EDT -----  new prescription Aciphex 20 mg pill dispense 30 with 11 refills.  Take 1 daily 30 minutes before breakfast.

## 2023-09-26 ENCOUNTER — Encounter (HOSPITAL_COMMUNITY): Payer: Self-pay | Admitting: Internal Medicine

## 2023-11-21 DIAGNOSIS — E1159 Type 2 diabetes mellitus with other circulatory complications: Secondary | ICD-10-CM | POA: Diagnosis not present

## 2023-11-21 DIAGNOSIS — E782 Mixed hyperlipidemia: Secondary | ICD-10-CM | POA: Diagnosis not present

## 2023-11-21 DIAGNOSIS — G720 Drug-induced myopathy: Secondary | ICD-10-CM | POA: Diagnosis not present

## 2023-12-26 ENCOUNTER — Other Ambulatory Visit: Payer: Self-pay | Admitting: Gastroenterology

## 2023-12-26 DIAGNOSIS — K219 Gastro-esophageal reflux disease without esophagitis: Secondary | ICD-10-CM

## 2024-01-25 DIAGNOSIS — E119 Type 2 diabetes mellitus without complications: Secondary | ICD-10-CM | POA: Diagnosis not present

## 2024-01-25 DIAGNOSIS — H26491 Other secondary cataract, right eye: Secondary | ICD-10-CM | POA: Diagnosis not present

## 2024-01-25 DIAGNOSIS — Z961 Presence of intraocular lens: Secondary | ICD-10-CM | POA: Diagnosis not present

## 2024-01-25 DIAGNOSIS — H04123 Dry eye syndrome of bilateral lacrimal glands: Secondary | ICD-10-CM | POA: Diagnosis not present

## 2024-01-25 DIAGNOSIS — H43813 Vitreous degeneration, bilateral: Secondary | ICD-10-CM | POA: Diagnosis not present

## 2024-02-12 DIAGNOSIS — E1159 Type 2 diabetes mellitus with other circulatory complications: Secondary | ICD-10-CM | POA: Diagnosis not present

## 2024-02-12 DIAGNOSIS — Z6829 Body mass index (BMI) 29.0-29.9, adult: Secondary | ICD-10-CM | POA: Diagnosis not present

## 2024-02-12 DIAGNOSIS — E7849 Other hyperlipidemia: Secondary | ICD-10-CM | POA: Diagnosis not present

## 2024-02-12 DIAGNOSIS — E790 Hyperuricemia without signs of inflammatory arthritis and tophaceous disease: Secondary | ICD-10-CM | POA: Diagnosis not present

## 2024-02-12 DIAGNOSIS — E663 Overweight: Secondary | ICD-10-CM | POA: Diagnosis not present

## 2024-02-12 DIAGNOSIS — I10 Essential (primary) hypertension: Secondary | ICD-10-CM | POA: Diagnosis not present

## 2024-02-12 DIAGNOSIS — E782 Mixed hyperlipidemia: Secondary | ICD-10-CM | POA: Diagnosis not present

## 2024-02-12 DIAGNOSIS — Z0001 Encounter for general adult medical examination with abnormal findings: Secondary | ICD-10-CM | POA: Diagnosis not present

## 2024-02-12 DIAGNOSIS — G720 Drug-induced myopathy: Secondary | ICD-10-CM | POA: Diagnosis not present

## 2024-02-12 DIAGNOSIS — R1310 Dysphagia, unspecified: Secondary | ICD-10-CM | POA: Diagnosis not present

## 2024-02-12 DIAGNOSIS — E538 Deficiency of other specified B group vitamins: Secondary | ICD-10-CM | POA: Diagnosis not present

## 2024-02-12 DIAGNOSIS — E039 Hypothyroidism, unspecified: Secondary | ICD-10-CM | POA: Diagnosis not present

## 2024-02-20 DIAGNOSIS — H26491 Other secondary cataract, right eye: Secondary | ICD-10-CM | POA: Diagnosis not present

## 2024-02-21 ENCOUNTER — Encounter: Payer: Self-pay | Admitting: Gastroenterology

## 2024-05-01 ENCOUNTER — Other Ambulatory Visit: Payer: Self-pay | Admitting: Gastroenterology

## 2024-05-01 DIAGNOSIS — K219 Gastro-esophageal reflux disease without esophagitis: Secondary | ICD-10-CM

## 2024-06-05 DIAGNOSIS — M4807 Spinal stenosis, lumbosacral region: Secondary | ICD-10-CM | POA: Diagnosis not present

## 2024-06-05 DIAGNOSIS — M4726 Other spondylosis with radiculopathy, lumbar region: Secondary | ICD-10-CM | POA: Diagnosis not present

## 2024-09-12 ENCOUNTER — Encounter: Payer: Self-pay | Admitting: Family Medicine

## 2024-09-12 ENCOUNTER — Ambulatory Visit: Payer: Self-pay

## 2024-09-12 DIAGNOSIS — K219 Gastro-esophageal reflux disease without esophagitis: Secondary | ICD-10-CM

## 2024-09-12 NOTE — Telephone Encounter (Signed)
 FYI Only or Action Required?: FYI only for provider.  Patient was last seen in primary care on NA.  Called Nurse Triage reporting Back Pain.  Symptoms began several years ago.  Interventions attempted: Nothing.  Symptoms are: unchanged.  Triage Disposition: See PCP Within 2 Weeks  Patient/caregiver understands and will follow disposition?: Yes   Copied from CRM [id number]. Topic: Clinical - Red Word Triage >> Sep 12, 2024 11:06 AM Antwanette L wrote: Red Word that prompted transfer to Nurse Triage: The patient is seeking to establish care at [facility] due to ongoing chronic back pain and fatigue.  [facility] Reason for Disposition  Back pain is a chronic symptom (recurrent or ongoing AND present > 4 weeks)  Answer Assessment - Initial Assessment Questions Advised UC today and ED if symptoms worsen. Provided mobile clinic information. Patient reports will call back to schedule new patient appt.  1. ONSET: When did the pain begin? (e.g., minutes, hours, days)     Chronic back pain, been having all my life 2. LOCATION: Where does it hurt? (upper, mid or lower back)     lumbar 3. SEVERITY: How bad is the pain?  (e.g., Scale 1-10; mild, moderate, or severe)     6/10; otc pain meds 4. PATTERN: Is the pain constant? (e.g., yes, no; constant, intermittent)      constant 5. RADIATION: Does the pain shoot into your legs or somewhere else?     denies 6. CAUSE:  What do you think is causing the back pain?      Chronic back  9. NEUROLOGIC SYMPTOMS: Do you have any weakness, numbness, or problems with bowel/bladder control?     denies 10. OTHER SYMPTOMS: Do you have any other symptoms? (e.g., fever, abdomen pain, burning with urination, blood in urine)       denies  Protocols used: Back Pain-A-AH
